# Patient Record
Sex: Male | Born: 1990 | Race: White | Hispanic: No | Marital: Single | Smoking: Former smoker
Health system: Southern US, Community
[De-identification: ages and names within clinical notes are randomized; demographics above are authoritative.]

## PROBLEM LIST (undated history)

## (undated) DIAGNOSIS — F329 Major depressive disorder, single episode, unspecified: Secondary | ICD-10-CM

## (undated) DIAGNOSIS — R569 Unspecified convulsions: Secondary | ICD-10-CM

## (undated) DIAGNOSIS — F419 Anxiety disorder, unspecified: Secondary | ICD-10-CM

## (undated) DIAGNOSIS — F32A Depression, unspecified: Secondary | ICD-10-CM

## (undated) DIAGNOSIS — T50905A Adverse effect of unspecified drugs, medicaments and biological substances, initial encounter: Secondary | ICD-10-CM

## (undated) HISTORY — PX: HAND SURGERY: SHX662

---
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
## 2012-02-15 ENCOUNTER — Encounter: Payer: Self-pay | Admitting: Internal Medicine

## 2012-02-15 ENCOUNTER — Ambulatory Visit (INDEPENDENT_AMBULATORY_CARE_PROVIDER_SITE_OTHER): Payer: BC Managed Care – PPO | Admitting: Internal Medicine

## 2012-02-15 VITALS — BP 122/80 | HR 80 | Temp 97.4°F | Resp 16 | Wt 160.0 lb

## 2012-02-15 DIAGNOSIS — F411 Generalized anxiety disorder: Secondary | ICD-10-CM

## 2012-02-15 DIAGNOSIS — F988 Other specified behavioral and emotional disorders with onset usually occurring in childhood and adolescence: Secondary | ICD-10-CM | POA: Insufficient documentation

## 2012-02-15 DIAGNOSIS — F419 Anxiety disorder, unspecified: Secondary | ICD-10-CM

## 2012-02-15 DIAGNOSIS — Z23 Encounter for immunization: Secondary | ICD-10-CM

## 2012-02-15 DIAGNOSIS — R21 Rash and other nonspecific skin eruption: Secondary | ICD-10-CM

## 2012-02-15 MED ORDER — ESCITALOPRAM OXALATE 10 MG PO TABS: 10.0000 mg | ORAL_TABLET | Freq: Every day | ORAL | Status: DC

## 2012-02-15 MED ORDER — METHYLPHENIDATE HCL 5 MG PO TABS: 5.0000 mg | ORAL_TABLET | Freq: Two times a day (BID) | ORAL | Status: DC

## 2012-02-15 NOTE — Progress Notes (Signed)
Subjective:    Patient ID: Nicholas Harrison, male    DOB: 05-30-1990, 21 y.o.   MRN: [id number]  HPI  F/u anxiety x lifelong. Any sort of public exposure involving speaking would produce stuttering and speech blocks. He would be sweating. Not better in spite of previous treatments. Denies depression. He was not able to tolerate Buspar. Doing well on Lexapro  - no side effects C/o poor focus, gets distracted a lot - worse lately, grades are down - could be better...     Wt Readings from Last 3 Encounters:  02/15/12 160 lb (72.576 kg) (59.22%*)  09/02/11 154 lb (69.854 kg) (52.57%*)  06/02/11 156 lb 12 oz (71.101 kg) (58.34%*)   * Growth percentiles are based on CDC 2-20 Years data.   BP Readings from Last 3 Encounters:  02/15/12 122/80  09/02/11 120/72  06/02/11 118/64      Review of Systems  Constitutional: Negative for appetite change, fatigue and unexpected weight change.  HENT: Negative for nosebleeds, congestion, sore throat, sneezing, trouble swallowing and neck pain.   Eyes: Negative for itching and visual disturbance.  Respiratory: Negative for cough.   Cardiovascular: Negative for chest pain, palpitations and leg swelling.  Gastrointestinal: Negative for nausea, diarrhea, blood in stool and abdominal distention.  Genitourinary: Negative for frequency and hematuria.  Musculoskeletal: Negative for back pain, joint swelling and gait problem.  Skin: Negative for rash.  Neurological: Negative for dizziness, tremors, speech difficulty, weakness and numbness.  Psychiatric/Behavioral: Negative for suicidal ideas, behavioral problems, sleep disturbance, self-injury, dysphoric mood, decreased concentration and agitation. The patient is nervous/anxious.        Objective:   Physical Exam  Constitutional: He is oriented to person, place, and time. He appears well-developed.  HENT:  Mouth/Throat: Oropharynx is clear and moist.  Eyes: Conjunctivae normal are normal. Pupils  are equal, round, and reactive to light.  Neck: Normal range of motion. No JVD present. No thyromegaly present.  Cardiovascular: Normal rate, regular rhythm, normal heart sounds and intact distal pulses.  Exam reveals no gallop and no friction rub.   No murmur heard. Pulmonary/Chest: Effort normal and breath sounds normal. No respiratory distress. He has no wheezes. He has no rales. He exhibits no tenderness.  Abdominal: Soft. Bowel sounds are normal. He exhibits no distension and no mass. There is no tenderness. There is no rebound and no guarding.  Genitourinary: Penis normal.       B testes nl  Musculoskeletal: Normal range of motion. He exhibits no edema and no tenderness.  Lymphadenopathy:    He has no cervical adenopathy.  Neurological: He is alert and oriented to person, place, and time. He has normal reflexes. No cranial nerve deficit. He exhibits normal muscle tone. Coordination normal.  Skin: Skin is warm and dry. No rash noted.  Psychiatric: He has a normal mood and affect. His behavior is normal. Judgment and thought content normal.  2 patches of rash in B underarms  Lab Results  Component Value Date   WBC 5.3 08/30/2011   HGB 15.4 08/30/2011   HCT 45.1 08/30/2011   PLT 236.0 08/30/2011   GLUCOSE 70 08/30/2011   CHOL 162 08/30/2011   TRIG 328.0* 08/30/2011   HDL 51.00 08/30/2011   LDLDIRECT 87.1 08/30/2011   ALT 14 08/30/2011   AST 20 08/30/2011   NA 141 08/30/2011   K 4.2 08/30/2011   CL 103 08/30/2011   CREATININE 0.8 08/30/2011   BUN 11 08/30/2011   CO2 31  08/30/2011   TSH 2.47 08/30/2011          Assessment & Plan:

## 2012-02-15 NOTE — Assessment & Plan Note (Signed)
Not better. Will increase the Lexapro dose

## 2012-02-15 NOTE — Assessment & Plan Note (Signed)
Resolved

## 2012-02-15 NOTE — Assessment & Plan Note (Addendum)
We will try a low dose Ritalin  11/13  Potential benefits of a long term stimulants  use as well as potential risks  and complications were explained to the patient and were aknowledged.

## 2012-05-18 ENCOUNTER — Ambulatory Visit: Payer: BC Managed Care – PPO | Admitting: Internal Medicine

## 2013-02-18 ENCOUNTER — Encounter: Payer: Self-pay | Admitting: Internal Medicine

## 2013-02-18 ENCOUNTER — Ambulatory Visit (INDEPENDENT_AMBULATORY_CARE_PROVIDER_SITE_OTHER): Payer: BC Managed Care – PPO | Admitting: Internal Medicine

## 2013-02-18 VITALS — BP 128/84 | HR 76 | Temp 98.3°F | Resp 16 | Ht 69.0 in | Wt 163.0 lb

## 2013-02-18 DIAGNOSIS — F411 Generalized anxiety disorder: Secondary | ICD-10-CM

## 2013-02-18 DIAGNOSIS — Z23 Encounter for immunization: Secondary | ICD-10-CM

## 2013-02-18 DIAGNOSIS — F419 Anxiety disorder, unspecified: Secondary | ICD-10-CM

## 2013-02-18 DIAGNOSIS — Z0000 Encounter for general adult medical examination without abnormal findings: Secondary | ICD-10-CM

## 2013-02-18 MED ORDER — ESCITALOPRAM OXALATE 20 MG PO TABS: 20.0000 mg | ORAL_TABLET | Freq: Every day | ORAL | Status: DC

## 2013-02-18 NOTE — Assessment & Plan Note (Signed)
We discussed age appropriate health related issues, including available/recomended screening tests and vaccinations. We discussed a need for adhering to healthy diet and exercise. Labs were reviewed/ordered. All questions were answered. Age and sex related issues discussed (safe sex, seat belt use, etc.). 

## 2013-02-18 NOTE — Progress Notes (Signed)
Patient ID: Nicholas Harrison, male   DOB: 12/05/1990, 22 y.o.   MRN: [id number] Patient ID: Nicholas Harrison, male   DOB: [birth year], 22 y.o.   MRN: [id number]  Subjective:    HPI The patient is here for a wellness exam. The patient has been doing well overall without major physical or psychological issues going on lately.  F/u anxiety x lifelong. Now 20% better  Any sort of public exposure involving speaking would produce stuttering and speech blocks. He would be sweating. Not better in spite of previous treatments. Denies depression.   Wt Readings from Last 3 Encounters:  02/18/13 163 lb (73.936 kg)  02/15/12 160 lb (72.576 kg) (59%*, Z = 0.23)  09/02/11 154 lb (69.854 kg) (53%*, Z = 0.06)   * Growth percentiles are based on CDC 2-20 Years data.   BP Readings from Last 3 Encounters:  02/18/13 128/84  02/15/12 122/80  09/02/11 120/72      Review of Systems  Constitutional: Negative for appetite change, fatigue and unexpected weight change.  HENT: Negative for congestion, nosebleeds, sneezing, sore throat and trouble swallowing.   Eyes: Negative for itching and visual disturbance.  Respiratory: Negative for cough.   Cardiovascular: Negative for chest pain, palpitations and leg swelling.  Gastrointestinal: Negative for nausea, diarrhea, blood in stool and abdominal distention.  Genitourinary: Negative for frequency and hematuria.  Musculoskeletal: Negative for back pain, gait problem, joint swelling and neck pain.  Skin: Negative for rash.  Neurological: Negative for dizziness, tremors, speech difficulty, weakness and numbness.  Psychiatric/Behavioral: Negative for suicidal ideas, behavioral problems, sleep disturbance, self-injury, dysphoric mood, decreased concentration and agitation. The patient is nervous/anxious.        Objective:   Physical Exam  Constitutional: He is oriented to person, place, and time. He appears well-developed.  HENT:  Mouth/Throat: Oropharynx is  clear and moist.  Eyes: Conjunctivae are normal. Pupils are equal, round, and reactive to light.  Neck: Normal range of motion. No JVD present. No thyromegaly present.  Cardiovascular: Normal rate, regular rhythm, normal heart sounds and intact distal pulses.  Exam reveals no gallop and no friction rub.   No murmur heard. Pulmonary/Chest: Effort normal and breath sounds normal. No respiratory distress. He has no wheezes. He has no rales. He exhibits no tenderness.  Abdominal: Soft. Bowel sounds are normal. He exhibits no distension and no mass. There is no tenderness. There is no rebound and no guarding.  Genitourinary: Penis normal.  B testes nl  Musculoskeletal: Normal range of motion. He exhibits no edema and no tenderness.  Lymphadenopathy:    He has no cervical adenopathy.  Neurological: He is alert and oriented to person, place, and time. He has normal reflexes. No cranial nerve deficit. He exhibits normal muscle tone. Coordination normal.  Skin: Skin is warm and dry. No rash noted.  Psychiatric: He has a normal mood and affect. His behavior is normal. Judgment and thought content normal.   Lab Results  Component Value Date   WBC 5.3 08/30/2011   HGB 15.4 08/30/2011   HCT 45.1 08/30/2011   PLT 236.0 08/30/2011   GLUCOSE 70 08/30/2011   CHOL 162 08/30/2011   TRIG 328.0* 08/30/2011   HDL 51.00 08/30/2011   LDLDIRECT 87.1 08/30/2011   ALT 14 08/30/2011   AST 20 08/30/2011   NA 141 08/30/2011   K 4.2 08/30/2011   CL 103 08/30/2011   CREATININE 0.8 08/30/2011   BUN 11 08/30/2011   CO2 31 08/30/2011  TSH 2.47 08/30/2011          Assessment & Plan:

## 2013-02-18 NOTE — Progress Notes (Signed)
Pre visit review using our clinic review tool, if applicable. No additional management support is needed unless otherwise documented below in the visit note. 

## 2013-02-28 NOTE — Assessment & Plan Note (Signed)
Better Increase Lexapro if tolerated

## 2013-11-05 ENCOUNTER — Ambulatory Visit (INDEPENDENT_AMBULATORY_CARE_PROVIDER_SITE_OTHER): Payer: BC Managed Care – PPO | Admitting: Internal Medicine

## 2013-11-05 ENCOUNTER — Encounter: Payer: Self-pay | Admitting: Internal Medicine

## 2013-11-05 VITALS — BP 128/84 | HR 64 | Temp 97.8°F | Resp 16 | Wt 158.0 lb

## 2013-11-05 DIAGNOSIS — M5442 Lumbago with sciatica, left side: Secondary | ICD-10-CM

## 2013-11-05 DIAGNOSIS — F419 Anxiety disorder, unspecified: Secondary | ICD-10-CM

## 2013-11-05 DIAGNOSIS — M5430 Sciatica, unspecified side: Secondary | ICD-10-CM

## 2013-11-05 DIAGNOSIS — L853 Xerosis cutis: Secondary | ICD-10-CM

## 2013-11-05 DIAGNOSIS — L258 Unspecified contact dermatitis due to other agents: Secondary | ICD-10-CM

## 2013-11-05 DIAGNOSIS — F411 Generalized anxiety disorder: Secondary | ICD-10-CM

## 2013-11-05 MED ORDER — ESCITALOPRAM OXALATE 20 MG PO TABS: 20.0000 mg | ORAL_TABLET | Freq: Every day | ORAL | Status: DC

## 2013-11-05 NOTE — Progress Notes (Signed)
Pre visit review using our clinic review tool, if applicable. No additional management support is needed unless otherwise documented below in the visit note. 

## 2013-11-05 NOTE — Assessment & Plan Note (Signed)
Continue with current prescription therapy as reflected on the Med list.  

## 2013-11-05 NOTE — Progress Notes (Signed)
   Subjective:    HPI  F/u anxiety x lifelong. Better on Lexapro Public exposure involving speaking used to produce stuttering and speech blocks - much better now.   Wt Readings from Last 3 Encounters:  11/05/13 158 lb (71.668 kg)  02/18/13 163 lb (73.936 kg)  02/15/12 160 lb (72.576 kg) (59%*, Z = 0.23)   * Growth percentiles are based on CDC 2-20 Years data.   BP Readings from Last 3 Encounters:  11/05/13 128/84  02/18/13 128/84  02/15/12 122/80      Review of Systems  Constitutional: Negative for appetite change, fatigue and unexpected weight change.  HENT: Negative for congestion, nosebleeds, sneezing, sore throat and trouble swallowing.   Eyes: Negative for itching and visual disturbance.  Respiratory: Negative for cough.   Cardiovascular: Negative for chest pain, palpitations and leg swelling.  Gastrointestinal: Negative for nausea, diarrhea, blood in stool and abdominal distention.  Genitourinary: Negative for frequency and hematuria.  Musculoskeletal: Negative for back pain, gait problem, joint swelling and neck pain.  Skin: Negative for rash.  Neurological: Negative for dizziness, tremors, speech difficulty, weakness and numbness.  Psychiatric/Behavioral: Negative for suicidal ideas, behavioral problems, sleep disturbance, self-injury, dysphoric mood, decreased concentration and agitation. The patient is nervous/anxious.        Objective:   Physical Exam  Constitutional: He is oriented to person, place, and time. He appears well-developed.  HENT:  Mouth/Throat: Oropharynx is clear and moist.  Eyes: Conjunctivae are normal. Pupils are equal, round, and reactive to light.  Neck: Normal range of motion. No JVD present. No thyromegaly present.  Cardiovascular: Normal rate, regular rhythm, normal heart sounds and intact distal pulses.  Exam reveals no gallop and no friction rub.   No murmur heard. Pulmonary/Chest: Effort normal and breath sounds normal. No  respiratory distress. He has no wheezes. He has no rales. He exhibits no tenderness.  Abdominal: Soft. Bowel sounds are normal. He exhibits no distension and no mass. There is no tenderness. There is no rebound and no guarding.  Genitourinary: Penis normal.  B testes nl  Musculoskeletal: Normal range of motion. He exhibits no edema and no tenderness.  Lymphadenopathy:    He has no cervical adenopathy.  Neurological: He is alert and oriented to person, place, and time. He has normal reflexes. No cranial nerve deficit. He exhibits normal muscle tone. Coordination normal.  Skin: Skin is warm and dry. No rash noted.  Psychiatric: He has a normal mood and affect. His behavior is normal. Judgment and thought content normal.   Lab Results  Component Value Date   WBC 5.3 08/30/2011   HGB 15.4 08/30/2011   HCT 45.1 08/30/2011   PLT 236.0 08/30/2011   GLUCOSE 70 08/30/2011   CHOL 162 08/30/2011   TRIG 328.0* 08/30/2011   HDL 51.00 08/30/2011   LDLDIRECT 87.1 08/30/2011   ALT 14 08/30/2011   AST 20 08/30/2011   NA 141 08/30/2011   K 4.2 08/30/2011   CL 103 08/30/2011   CREATININE 0.8 08/30/2011   BUN 11 08/30/2011   CO2 31 08/30/2011   TSH 2.47 08/30/2011          Assessment & Plan:

## 2013-11-05 NOTE — Assessment & Plan Note (Signed)
8/15 trunk rash Shower less Triamcinolone cream prn

## 2013-11-05 NOTE — Assessment & Plan Note (Signed)
2015 NS Dr Yetta BarreJones   --- L sciatica  Surgery was denied

## 2014-05-21 ENCOUNTER — Other Ambulatory Visit: Payer: Self-pay | Admitting: Internal Medicine

## 2014-06-10 ENCOUNTER — Ambulatory Visit: Payer: Self-pay | Admitting: Internal Medicine

## 2014-10-05 ENCOUNTER — Other Ambulatory Visit: Payer: Self-pay | Admitting: Internal Medicine

## 2014-10-29 ENCOUNTER — Encounter: Payer: Self-pay | Admitting: Internal Medicine

## 2014-10-29 ENCOUNTER — Ambulatory Visit (INDEPENDENT_AMBULATORY_CARE_PROVIDER_SITE_OTHER): Payer: BLUE CROSS/BLUE SHIELD | Admitting: Internal Medicine

## 2014-10-29 VITALS — BP 140/80 | HR 70 | Ht 70.0 in | Wt 170.0 lb

## 2014-10-29 DIAGNOSIS — F1120 Opioid dependence, uncomplicated: Secondary | ICD-10-CM

## 2014-10-29 DIAGNOSIS — F419 Anxiety disorder, unspecified: Secondary | ICD-10-CM | POA: Diagnosis not present

## 2014-10-29 DIAGNOSIS — Z0000 Encounter for general adult medical examination without abnormal findings: Secondary | ICD-10-CM

## 2014-10-29 MED ORDER — TRIAMCINOLONE ACETONIDE 0.1 % EX CREA: 1.0000 "application " | TOPICAL_CREAM | Freq: Four times a day (QID) | CUTANEOUS | Status: DC

## 2014-10-29 MED ORDER — CLONIDINE HCL 0.1 MG PO TABS: 0.1000 mg | ORAL_TABLET | Freq: Two times a day (BID) | ORAL | Status: DC

## 2014-10-29 MED ORDER — CILIDINIUM-CHLORDIAZEPOXIDE 2.5-5 MG PO CAPS: 1.0000 | ORAL_CAPSULE | Freq: Two times a day (BID) | ORAL | Status: DC | PRN

## 2014-10-29 MED ORDER — ESCITALOPRAM OXALATE 20 MG PO TABS: 20.0000 mg | ORAL_TABLET | Freq: Every day | ORAL | Status: DC

## 2014-10-29 NOTE — Assessment & Plan Note (Signed)
We discussed age appropriate health related issues, including available/recomended screening tests and vaccinations. We discussed a need for adhering to healthy diet and exercise. Labs/EKG were reviewed/ordered. All questions were answered.   

## 2014-10-29 NOTE — Assessment & Plan Note (Addendum)
Discussed Psychiatry consult Clonidine prn and Librax prn May need to add Wellbutrin XL

## 2014-10-29 NOTE — Progress Notes (Signed)
Pre visit review using our clinic review tool, if applicable. No additional management support is needed unless otherwise documented below in the visit note. 

## 2014-10-29 NOTE — Progress Notes (Signed)
Subjective:  Patient ID: Nicholas Harrison, male    DOB: 19-Sep-1990  Age: 24 y.o. MRN: [id number]  CC: Annual Exam   HPI Nicholas Harrison presents for a well exam C/o using pain pills  X 8-12 months - Norco or Percocet up to 10 a day. Trying to quit.  He attends NA meetings. C/o stomach cramps and jitterness due to withdrawal...    Outpatient Prescriptions Prior to Visit  Medication Sig Dispense Refill  . PRESCRIPTION MEDICATION Take by mouth. Oral ABX for facial acne.    . escitalopram (LEXAPRO) 20 MG tablet TAKE 1 TABLET BY MOUTH DAILY 90 tablet 0   No facility-administered medications prior to visit.    ROS Review of Systems  Constitutional: Negative for appetite change, fatigue and unexpected weight change.  HENT: Negative for congestion, nosebleeds, sneezing, sore throat and trouble swallowing.   Eyes: Negative for itching and visual disturbance.  Respiratory: Negative for cough.   Cardiovascular: Negative for chest pain, palpitations and leg swelling.  Gastrointestinal: Positive for abdominal pain. Negative for nausea, vomiting, diarrhea, blood in stool and abdominal distention.  Genitourinary: Negative for urgency, frequency and hematuria.  Musculoskeletal: Negative for back pain, joint swelling, gait problem and neck pain.  Skin: Negative for rash.  Neurological: Negative for dizziness, tremors, speech difficulty and weakness.  Psychiatric/Behavioral: Positive for decreased concentration. Negative for suicidal ideas, behavioral problems, sleep disturbance, dysphoric mood and agitation. The patient is nervous/anxious.     Objective:  BP 140/80 mmHg  Pulse 70  Ht  (1.778 m)  Wt 170 lb (77.111 kg)  BMI 24.39 kg/m2  SpO2 98%  BP Readings from Last 3 Encounters:  10/29/14 140/80  11/05/13 128/84  02/18/13 128/84    Wt Readings from Last 3 Encounters:  10/29/14 170 lb (77.111 kg)  11/05/13 158 lb (71.668 kg)  02/18/13 163 lb (73.936 kg)    Physical  Exam  Constitutional: He is oriented to person, place, and time. He appears well-developed. No distress.  NAD  HENT:  Mouth/Throat: Oropharynx is clear and moist.  Eyes: Conjunctivae are normal. Pupils are equal, round, and reactive to light.  Neck: Normal range of motion. No JVD present. No thyromegaly present.  Cardiovascular: Normal rate, regular rhythm, normal heart sounds and intact distal pulses.  Exam reveals no gallop and no friction rub.   No murmur heard. Pulmonary/Chest: Effort normal and breath sounds normal. No respiratory distress. He has no wheezes. He has no rales. He exhibits no tenderness.  Abdominal: Soft. Bowel sounds are normal. He exhibits no distension and no mass. There is no tenderness. There is no rebound and no guarding.  Musculoskeletal: Normal range of motion. He exhibits no edema or tenderness.  Lymphadenopathy:    He has no cervical adenopathy.  Neurological: He is alert and oriented to person, place, and time. He has normal reflexes. No cranial nerve deficit. He exhibits normal muscle tone. He displays a negative Romberg sign. Coordination and gait normal.  Skin: Skin is warm and dry. No rash noted. No erythema.  Psychiatric: He has a normal mood and affect. His behavior is normal. Judgment and thought content normal.    Lab Results  Component Value Date   WBC 5.3 08/30/2011   HGB 15.4 08/30/2011   HCT 45.1 08/30/2011   PLT 236.0 08/30/2011   GLUCOSE 70 08/30/2011   CHOL 162 08/30/2011   TRIG 328.0* 08/30/2011   HDL 51.00 08/30/2011   LDLDIRECT 87.1 08/30/2011   ALT 14 08/30/2011  AST 20 08/30/2011   NA 141 08/30/2011   K 4.2 08/30/2011   CL 103 08/30/2011   CREATININE 0.8 08/30/2011   BUN 11 08/30/2011   CO2 31 08/30/2011   TSH 2.47 08/30/2011    Patient was never admitted.  Assessment & Plan:   Nicholas Harrison was seen today for annual exam.  Diagnoses and all orders for this visit:  Well adult exam  Uncomplicated opioid  dependence  Chronic anxiety -     Ambulatory referral to Psychiatry  Other orders -     cloNIDine (CATAPRES) 0.1 MG tablet; Take 1 tablet (0.1 mg total) by mouth 2 (two) times daily. For withdrawal symptoms. -     clidinium-chlordiazePOXIDE (LIBRAX) 5-2.5 MG per capsule; Take 1 capsule by mouth 2 (two) times daily as needed (abd cramps). -     escitalopram (LEXAPRO) 20 MG tablet; Take 1 tablet (20 mg total) by mouth daily. -     triamcinolone cream (KENALOG) 0.1 %; Apply 1 application topically 4 (four) times daily.  I have changed Mr. Nicholas Harrison's escitalopram. I am also having him start on cloNIDine, clidinium-chlordiazePOXIDE, and triamcinolone cream. Additionally, I am having him maintain his PRESCRIPTION MEDICATION.  Meds ordered this encounter  Medications  . cloNIDine (CATAPRES) 0.1 MG tablet    Sig: Take 1 tablet (0.1 mg total) by mouth 2 (two) times daily. For withdrawal symptoms.    Dispense:  60 tablet    Refill:  2  . clidinium-chlordiazePOXIDE (LIBRAX) 5-2.5 MG per capsule    Sig: Take 1 capsule by mouth 2 (two) times daily as needed (abd cramps).    Dispense:  60 capsule    Refill:  0    Max 60/month  . escitalopram (LEXAPRO) 20 MG tablet    Sig: Take 1 tablet (20 mg total) by mouth daily.    Dispense:  90 tablet    Refill:  1  . triamcinolone cream (KENALOG) 0.1 %    Sig: Apply 1 application topically 4 (four) times daily.    Dispense:  45 g    Refill:  0     Follow-up: Return in about 4 weeks (around 11/26/2014) for a follow-up visit.  Sonda Primes, MD

## 2014-10-29 NOTE — Assessment & Plan Note (Signed)
Cont Lexapro 

## 2014-11-20 ENCOUNTER — Ambulatory Visit: Payer: BLUE CROSS/BLUE SHIELD | Admitting: Internal Medicine

## 2014-12-05 ENCOUNTER — Ambulatory Visit: Payer: BLUE CROSS/BLUE SHIELD | Admitting: Internal Medicine

## 2014-12-16 ENCOUNTER — Telehealth: Payer: Self-pay | Admitting: Internal Medicine

## 2014-12-16 DIAGNOSIS — F1120 Opioid dependence, uncomplicated: Secondary | ICD-10-CM

## 2014-12-16 NOTE — Telephone Encounter (Signed)
Patient is calling to advise that he would like a referral to neurology. He states that he was asked to take methadone by previous provider, and he does not want to take methadone.   Dr. Beryle Beams  Neurologist  Address: [address], [place], [place] [address] Phone: [phone] Hours: Open today  8:30AM-5PM DX of opoid dependence

## 2014-12-17 NOTE — Telephone Encounter (Signed)
Ok Thx 

## 2015-10-06 ENCOUNTER — Telehealth: Payer: Self-pay | Admitting: Internal Medicine

## 2015-10-06 NOTE — Telephone Encounter (Signed)
Patient scheduled an appt for 10/22/2015. States that he is out of escitalopram (LEXAPRO) 20 MG tablet [[id number]][[id number]]  And is asking for a temorary refill. States that [facility] refill, so we do not reflect it.

## 2015-10-06 NOTE — Telephone Encounter (Signed)
OK #15 Thx

## 2015-10-07 MED ORDER — ESCITALOPRAM OXALATE 20 MG PO TABS: 20.0000 mg | ORAL_TABLET | Freq: Every day | ORAL | Status: DC

## 2015-10-07 NOTE — Telephone Encounter (Signed)
Called pt no answer LMOM MD approved # [id number] has been sent to pharmacy...Raechel Chute/lmb

## 2015-10-22 ENCOUNTER — Encounter: Payer: Self-pay | Admitting: Internal Medicine

## 2015-10-22 ENCOUNTER — Ambulatory Visit (INDEPENDENT_AMBULATORY_CARE_PROVIDER_SITE_OTHER): Payer: BLUE CROSS/BLUE SHIELD | Admitting: Internal Medicine

## 2015-10-22 DIAGNOSIS — F988 Other specified behavioral and emotional disorders with onset usually occurring in childhood and adolescence: Secondary | ICD-10-CM

## 2015-10-22 DIAGNOSIS — F909 Attention-deficit hyperactivity disorder, unspecified type: Secondary | ICD-10-CM

## 2015-10-22 DIAGNOSIS — F419 Anxiety disorder, unspecified: Secondary | ICD-10-CM | POA: Diagnosis not present

## 2015-10-22 DIAGNOSIS — F1129 Opioid dependence with unspecified opioid-induced disorder: Secondary | ICD-10-CM | POA: Diagnosis not present

## 2015-10-22 MED ORDER — ESCITALOPRAM OXALATE 20 MG PO TABS: 20.0000 mg | ORAL_TABLET | Freq: Every day | ORAL | 5 refills | Status: DC

## 2015-10-22 MED ORDER — BUPROPION HCL ER (XL) 150 MG PO TB24
150.0000 mg | ORAL_TABLET | Freq: Every day | ORAL | 5 refills | Status: DC
Start: 2015-10-22 — End: 2016-09-14

## 2015-10-22 NOTE — Assessment & Plan Note (Signed)
Options discussed Will try to add Wellbutrin XL for anxiety and stress in am. Cont Lexapro at hs

## 2015-10-22 NOTE — Progress Notes (Signed)
Subjective:  Patient ID: Nicholas Harrison, male    DOB: Jul 20, 1990  Age: 25 y.o. MRN: [id number]  CC: Panic Attack and Depression   HPI DIETRICH SAMUELSON presents for anxiety, panic attacks, depression. C/o OCD traits.  The pt is s/p inpatient treatment - [facility] - March 2017. He used [facility] and Suboxone - he did not like either. There has been some rare opioid use per pt  Outpatient Medications Prior to Visit  Medication Sig Dispense Refill  . escitalopram (LEXAPRO) 20 MG tablet Take 1 tablet (20 mg total) by mouth daily. Must keep Aug appt for future refills 15 tablet 0  . clidinium-chlordiazePOXIDE (LIBRAX) 5-2.5 MG per capsule Take 1 capsule by mouth 2 (two) times daily as needed (abd cramps). (Patient not taking: Reported on 10/22/2015) 60 capsule 0  . cloNIDine (CATAPRES) 0.1 MG tablet Take 1 tablet (0.1 mg total) by mouth 2 (two) times daily. For withdrawal symptoms. (Patient not taking: Reported on 10/22/2015) 60 tablet 2  . PRESCRIPTION MEDICATION Take by mouth. Oral ABX for facial acne.    . triamcinolone cream (KENALOG) 0.1 % Apply 1 application topically 4 (four) times daily. (Patient not taking: Reported on 10/22/2015) 45 g 0   No facility-administered medications prior to visit.     ROS Review of Systems  Constitutional: Negative for appetite change, fatigue and unexpected weight change.  HENT: Negative for congestion, nosebleeds, sneezing, sore throat and trouble swallowing.   Eyes: Negative for itching and visual disturbance.  Respiratory: Negative for cough.   Cardiovascular: Negative for chest pain, palpitations and leg swelling.  Gastrointestinal: Negative for abdominal distention, blood in stool, diarrhea and nausea.  Genitourinary: Negative for frequency and hematuria.  Musculoskeletal: Negative for back pain, gait problem, joint swelling and neck pain.  Skin: Negative for rash.  Neurological: Negative for dizziness, tremors, speech difficulty and  weakness.  Psychiatric/Behavioral: Positive for decreased concentration. Negative for agitation, dysphoric mood, sleep disturbance and suicidal ideas. The patient is nervous/anxious.     Objective:  BP 114/80   Pulse 72   Wt 187 lb (84.8 kg)   SpO2 97%   BMI 26.83 kg/m   BP Readings from Last 3 Encounters:  10/22/15 114/80  10/29/14 140/80  11/05/13 128/84    Wt Readings from Last 3 Encounters:  10/22/15 187 lb (84.8 kg)  10/29/14 170 lb (77.1 kg)  11/05/13 158 lb (71.7 kg)    Physical Exam  Constitutional: He is oriented to person, place, and time. He appears well-developed. No distress.  NAD  HENT:  Mouth/Throat: Oropharynx is clear and moist.  Eyes: Conjunctivae are normal. Pupils are equal, round, and reactive to light.  Neck: Normal range of motion. No JVD present. No thyromegaly present.  Cardiovascular: Normal rate, regular rhythm, normal heart sounds and intact distal pulses.  Exam reveals no gallop and no friction rub.   No murmur heard. Pulmonary/Chest: Effort normal and breath sounds normal. No respiratory distress. He has no wheezes. He has no rales. He exhibits no tenderness.  Abdominal: Soft. Bowel sounds are normal. He exhibits no distension and no mass. There is no tenderness. There is no rebound and no guarding.  Musculoskeletal: Normal range of motion. He exhibits no edema or tenderness.  Lymphadenopathy:    He has no cervical adenopathy.  Neurological: He is alert and oriented to person, place, and time. He has normal reflexes. No cranial nerve deficit. He exhibits normal muscle tone. He displays a negative Romberg sign. Coordination and  gait normal.  Skin: Skin is warm and dry. No rash noted.  Psychiatric: He has a normal mood and affect. His behavior is normal. Judgment and thought content normal.    Lab Results  Component Value Date   WBC 5.3 08/30/2011   HGB 15.4 08/30/2011   HCT 45.1 08/30/2011   PLT 236.0 08/30/2011   GLUCOSE 70 08/30/2011    CHOL 162 08/30/2011   TRIG 328.0 (H) 08/30/2011   HDL 51.00 08/30/2011   LDLDIRECT 87.1 08/30/2011   ALT 14 08/30/2011   AST 20 08/30/2011   NA 141 08/30/2011   K 4.2 08/30/2011   CL 103 08/30/2011   CREATININE 0.8 08/30/2011   BUN 11 08/30/2011   CO2 31 08/30/2011   TSH 2.47 08/30/2011    Patient was never admitted.  Assessment & Plan:   There are no diagnoses linked to this encounter. I am having Mr. Soulier maintain his PRESCRIPTION MEDICATION, cloNIDine, clidinium-chlordiazePOXIDE, triamcinolone cream, and escitalopram.  No orders of the defined types were placed in this encounter.    Follow-up: No Follow-up on file.  Sonda Primes, MD

## 2015-10-22 NOTE — Assessment & Plan Note (Signed)
Options discussed Will try to add

## 2015-10-22 NOTE — Assessment & Plan Note (Addendum)
Summer 2017 rare opioid use per pt Options discussed Will try to add Wellbutrin XL for anxiety and stress in am. Cont Lexapro at hs

## 2015-10-22 NOTE — Progress Notes (Signed)
Pre visit review using our clinic review tool, if applicable. No additional management support is needed unless otherwise documented below in the visit note. 

## 2015-12-14 ENCOUNTER — Telehealth: Payer: Self-pay | Admitting: *Deleted

## 2015-12-14 NOTE — Telephone Encounter (Signed)
Rec'd call pt is requesting refill on Librax...Nicholas Harrison/lmb

## 2015-12-14 NOTE — Telephone Encounter (Signed)
OK to fill this prescription with additional refills x0 Keep ROV Thank you!  

## 2015-12-15 MED ORDER — CILIDINIUM-CHLORDIAZEPOXIDE 2.5-5 MG PO CAPS: 1.0000 | ORAL_CAPSULE | Freq: Two times a day (BID) | ORAL | 0 refills | Status: DC | PRN

## 2015-12-15 NOTE — Telephone Encounter (Signed)
Called pt no answer LMOM rx has been called into [facility]...Nicholas Harrison/lmb

## 2016-01-13 ENCOUNTER — Encounter: Payer: Self-pay | Admitting: Internal Medicine

## 2016-01-13 ENCOUNTER — Ambulatory Visit (INDEPENDENT_AMBULATORY_CARE_PROVIDER_SITE_OTHER): Payer: BLUE CROSS/BLUE SHIELD | Admitting: Internal Medicine

## 2016-01-13 VITALS — BP 130/80 | HR 68 | Wt 189.0 lb

## 2016-01-13 DIAGNOSIS — F419 Anxiety disorder, unspecified: Secondary | ICD-10-CM

## 2016-01-13 DIAGNOSIS — F1129 Opioid dependence with unspecified opioid-induced disorder: Secondary | ICD-10-CM

## 2016-01-13 MED ORDER — QUETIAPINE FUMARATE 25 MG PO TABS: 25.0000 mg | ORAL_TABLET | Freq: Every day | ORAL | 5 refills | Status: DC

## 2016-01-13 NOTE — Assessment & Plan Note (Signed)
d/c Wellbutrin XL for anxiety and stress in am. Cont Lexapro at hs. Start seroquel at a low dose

## 2016-01-13 NOTE — Progress Notes (Signed)
Subjective:  Patient ID: Nicholas Harrison, male    DOB: Jun 17, 1990  Age: 25 y.o. MRN: [id number]  CC: No chief complaint on file.   HPI Nicholas Harrison presents for anxiety and narcotic dependence; sober 1.5 months. C/o feeling uptight He stopped Wellbutrin - it did not help to focus. C/o anxiety in the mornings  Outpatient Medications Prior to Visit  Medication Sig Dispense Refill  . escitalopram (LEXAPRO) 20 MG tablet Take 1 tablet (20 mg total) by mouth daily. Take in PM 30 tablet 5  . PRESCRIPTION MEDICATION Take by mouth. Oral ABX for facial acne.    . triamcinolone cream (KENALOG) 0.1 % Apply 1 application topically 4 (four) times daily. 45 g 0  . buPROPion (WELLBUTRIN XL) 150 MG 24 hr tablet Take 1 tablet (150 mg total) by mouth daily. Take in am (Patient not taking: Reported on 01/13/2016) 30 tablet 5  . clidinium-chlordiazePOXIDE (LIBRAX) 5-2.5 MG capsule Take 1 capsule by mouth 2 (two) times daily as needed (abd cramps). (Patient not taking: Reported on 01/13/2016) 60 capsule 0  . cloNIDine (CATAPRES) 0.1 MG tablet Take 1 tablet (0.1 mg total) by mouth 2 (two) times daily. For withdrawal symptoms. (Patient not taking: Reported on 01/13/2016) 60 tablet 2   No facility-administered medications prior to visit.     ROS Review of Systems  Constitutional: Negative for appetite change, fatigue and unexpected weight change.  HENT: Negative for congestion, nosebleeds, sneezing, sore throat and trouble swallowing.   Eyes: Negative for itching and visual disturbance.  Respiratory: Negative for cough.   Cardiovascular: Negative for chest pain, palpitations and leg swelling.  Gastrointestinal: Negative for abdominal distention, blood in stool, diarrhea and nausea.  Genitourinary: Negative for frequency and hematuria.  Musculoskeletal: Negative for back pain, gait problem, joint swelling and neck pain.  Skin: Negative for rash.  Neurological: Negative for dizziness, tremors, speech  difficulty and weakness.  Psychiatric/Behavioral: Positive for decreased concentration, dysphoric mood and sleep disturbance. Negative for agitation, confusion and suicidal ideas. The patient is nervous/anxious.     Objective:  BP 130/80   Pulse 68   Wt 189 lb (85.7 kg)   SpO2 98%   BMI 27.12 kg/m   BP Readings from Last 3 Encounters:  01/13/16 130/80  10/22/15 114/80  10/29/14 140/80    Wt Readings from Last 3 Encounters:  01/13/16 189 lb (85.7 kg)  10/22/15 187 lb (84.8 kg)  10/29/14 170 lb (77.1 kg)    Physical Exam  Constitutional: He is oriented to person, place, and time. He appears well-developed. No distress.  NAD  HENT:  Mouth/Throat: Oropharynx is clear and moist.  Eyes: Conjunctivae are normal. Pupils are equal, round, and reactive to light.  Neck: Normal range of motion. No JVD present. No thyromegaly present.  Cardiovascular: Normal rate, regular rhythm, normal heart sounds and intact distal pulses.  Exam reveals no gallop and no friction rub.   No murmur heard. Pulmonary/Chest: Effort normal and breath sounds normal. No respiratory distress. He has no wheezes. He has no rales. He exhibits no tenderness.  Abdominal: Soft. Bowel sounds are normal. He exhibits no distension and no mass. There is no tenderness. There is no rebound and no guarding.  Musculoskeletal: Normal range of motion. He exhibits no edema or tenderness.  Lymphadenopathy:    He has no cervical adenopathy.  Neurological: He is alert and oriented to person, place, and time. He has normal reflexes. No cranial nerve deficit. He exhibits normal muscle tone. He  displays a negative Romberg sign. Coordination and gait normal.  Skin: Skin is warm and dry. No rash noted.  Psychiatric: His behavior is normal. Judgment and thought content normal.    Lab Results  Component Value Date   WBC 5.3 08/30/2011   HGB 15.4 08/30/2011   HCT 45.1 08/30/2011   PLT 236.0 08/30/2011   GLUCOSE 70 08/30/2011   CHOL  162 08/30/2011   TRIG 328.0 (H) 08/30/2011   HDL 51.00 08/30/2011   LDLDIRECT 87.1 08/30/2011   ALT 14 08/30/2011   AST 20 08/30/2011   NA 141 08/30/2011   K 4.2 08/30/2011   CL 103 08/30/2011   CREATININE 0.8 08/30/2011   BUN 11 08/30/2011   CO2 31 08/30/2011   TSH 2.47 08/30/2011    Patient was never admitted.  Assessment & Plan:   There are no diagnoses linked to this encounter. I am having Mr. Tubby maintain his PRESCRIPTION MEDICATION, cloNIDine, triamcinolone cream, buPROPion, escitalopram, and clidinium-chlordiazePOXIDE.  No orders of the defined types were placed in this encounter.    Follow-up: No Follow-up on file.  Sonda Primes, MD

## 2016-01-13 NOTE — Assessment & Plan Note (Addendum)
sober 1.5 months. C/o feeling uptight. He stopped Wellbutrin - it did not help to focus Seeing a psychologist  d/c Wellbutrin XL for anxiety and stress in am - it did not help. Cont Lexapro at hs. Start seroquel at a low dose

## 2016-01-13 NOTE — Progress Notes (Signed)
Pre visit review using our clinic review tool, if applicable. No additional management support is needed unless otherwise documented below in the visit note. 

## 2016-02-17 ENCOUNTER — Telehealth (HOSPITAL_COMMUNITY): Payer: Self-pay | Admitting: *Deleted

## 2016-02-17 NOTE — Telephone Encounter (Signed)
Office received [facility] sch new pt appt for pt. Called ref office and [facility]. Informed her that provider would like more [facility]. Mary verbalized understanding and stated she will contact the patient and let him know he need to get his information from [facility] and have the pt sign a release. Office verbalized understanding.

## 2016-05-30 ENCOUNTER — Encounter (HOSPITAL_COMMUNITY): Payer: Self-pay | Admitting: *Deleted

## 2016-05-30 ENCOUNTER — Emergency Department (HOSPITAL_COMMUNITY)
Admission: EM | Admit: 2016-05-30 | Discharge: 2016-05-31 | Disposition: A | Discharge status: Home / Self Care | Payer: BLUE CROSS/BLUE SHIELD | Attending: Emergency Medicine | Admitting: Emergency Medicine

## 2016-05-30 DIAGNOSIS — F1994 Other psychoactive substance use, unspecified with psychoactive substance-induced mood disorder: Secondary | ICD-10-CM | POA: Diagnosis present

## 2016-05-30 DIAGNOSIS — F1120 Opioid dependence, uncomplicated: Secondary | ICD-10-CM | POA: Diagnosis present

## 2016-05-30 DIAGNOSIS — Z818 Family history of other mental and behavioral disorders: Secondary | ICD-10-CM | POA: Diagnosis not present

## 2016-05-30 DIAGNOSIS — F1124 Opioid dependence with opioid-induced mood disorder: Secondary | ICD-10-CM | POA: Insufficient documentation

## 2016-05-30 DIAGNOSIS — Z87891 Personal history of nicotine dependence: Secondary | ICD-10-CM | POA: Diagnosis not present

## 2016-05-30 DIAGNOSIS — Z79899 Other long term (current) drug therapy: Secondary | ICD-10-CM | POA: Insufficient documentation

## 2016-05-30 DIAGNOSIS — F1220 Cannabis dependence, uncomplicated: Secondary | ICD-10-CM | POA: Diagnosis not present

## 2016-05-30 DIAGNOSIS — F909 Attention-deficit hyperactivity disorder, unspecified type: Secondary | ICD-10-CM | POA: Diagnosis not present

## 2016-05-30 LAB — CBC
HCT: 44.3 % (ref 39.0–52.0)
Hemoglobin: 15.1 g/dL (ref 13.0–17.0)
MCH: 28.5 pg (ref 26.0–34.0)
MCHC: 34.1 g/dL (ref 30.0–36.0)
MCV: 83.6 fL (ref 78.0–100.0)
PLATELETS: 283 10*3/uL (ref 150–400)
RBC: 5.3 MIL/uL (ref 4.22–5.81)
RDW: 13.5 % (ref 11.5–15.5)
WBC: 9.8 10*3/uL (ref 4.0–10.5)

## 2016-05-30 LAB — COMPREHENSIVE METABOLIC PANEL
ALBUMIN: 5.2 g/dL — AB (ref 3.5–5.0)
ALT: 13 U/L — ABNORMAL LOW (ref 17–63)
AST: 18 U/L (ref 15–41)
Alkaline Phosphatase: 68 U/L (ref 38–126)
Anion gap: 13 (ref 5–15)
BUN: 11 mg/dL (ref 6–20)
CHLORIDE: 104 mmol/L (ref 101–111)
CO2: 24 mmol/L (ref 22–32)
CREATININE: 0.78 mg/dL (ref 0.61–1.24)
Calcium: 10.1 mg/dL (ref 8.9–10.3)
GFR calc non Af Amer: >60 mL/min (ref >60)
Glucose, Bld: 98 mg/dL (ref 65–99)
Potassium: 3.2 mmol/L — ABNORMAL LOW (ref 3.5–5.1)
SODIUM: 141 mmol/L (ref 135–145)
Total Bilirubin: 0.7 mg/dL (ref 0.3–1.2)
Total Protein: 8.2 g/dL — ABNORMAL HIGH (ref 6.5–8.1)

## 2016-05-30 LAB — ETHANOL: Alcohol, Ethyl (B): <5 mg/dL (ref <5)

## 2016-05-30 LAB — RAPID URINE DRUG SCREEN, HOSP PERFORMED
AMPHETAMINES: NOT DETECTED
Barbiturates: NOT DETECTED
Benzodiazepines: POSITIVE — AB
Cocaine: NOT DETECTED
OPIATES: POSITIVE — AB
Tetrahydrocannabinol: POSITIVE — AB

## 2016-05-30 MED ORDER — ONDANSETRON HCL 4 MG PO TABS
4.0000 mg | ORAL_TABLET | Freq: Three times a day (TID) | ORAL | Status: DC | PRN
  Administered 2016-05-31: 4 mg via ORAL
  Filled 2016-05-30: qty 1

## 2016-05-30 MED ORDER — IBUPROFEN 400 MG PO TABS: 600.0000 mg | ORAL_TABLET | Freq: Three times a day (TID) | ORAL | Status: DC | PRN

## 2016-05-30 NOTE — BH Assessment (Signed)
Tele Assessment Note   Nicholas Harrison is an 26 y.o. male presenting to the ER requesting help with opiate use. The patient states he has been using opiates for four years. Using daily for 2 yrs from 50 mg to 300 mg per day. First used at the age of 26 yrs old. One inpatient detox 1 yr ago at [facility]. The patient remained clean for 50 days.  The patient uses xanax two or three months at a time. Using three or four bars at a time. First use at age 30 yrs old. Reports high anxiety on a regular basis. Using cannabis 2 or 3 times a week, bong or blunts, 2 grams a week.  The patient reports having suicidal thoughts with a vague plan to kill himself and homicidal thoughts toward other random people. He reports having these thoughts any time he's getting off drugs. He denied intent saying the consequences of his actions would keep him from acting on thoughts. He has knives and brass knuckles at home.  He is currently having these thoughts. 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 He stole from [place] recently and has a court date. Other stressors involved his parents pending divorce. He lives with his parents currently. Reports depression and anxiety on maternal side and father had issues with alcohol in the past. The patient appeared depressed, had flat affect, fair eye contact, was cooperative, pleasant, oriented x4.   The patient is willing to sign himself into a facility. Provider on call Donell Sievert NP recommends inpatient treatment    Diagnosis: Opiate use disorder, severe; Cannabis use disorder; Sedative, Hypnotic or Anxiolytic use disorder.  Past Medical History: History reviewed. No pertinent past medical history.  History reviewed. No pertinent surgical history.  Family History:  Family History  Problem Relation Age of Onset  . Depression Mother     anxiety  . Diabetes Maternal Grandfather      Social History:  reports that he has quit smoking. He has quit using smokeless tobacco. His smokeless tobacco use included Chew. He reports that he uses drugs. He reports that he does not drink alcohol.  Additional Social History:  Alcohol / Drug Use Pain Medications: see MAR Prescriptions: see MAR Over the Counter: see MAR  CIWA: CIWA-Ar BP: 147/85 Pulse Rate: 74 COWS:    PATIENT STRENGTHS: (choose at least two) Average or above average intelligence Motivation for treatment/growth  Allergies:  Allergies  Allergen Reactions  . Buspar [Buspirone] Nausea Only    Dizziness (also)    Home Medications:  (Not in a hospital admission)  OB/GYN Status:  No LMP for male patient.  General Assessment Data Location of Assessment: [facility] ED TTS Assessment: In system Is this a Tele or Face-to-Face Assessment?: Tele Assessment Is this an Initial Assessment or a Re-assessment for this encounter?: Initial Assessment Marital status: Single Is patient pregnant?: No Pregnancy Status: No Living Arrangements: Parent Can pt return to current living arrangement?: Yes Admission Status: Voluntary Is patient capable of signing voluntary admission?: Yes Referral Source: Self/Family/Friend Insurance type:  Herbalist)  Medical Screening Exam [facility] Walk-in ONLY) Medical Exam completed: Yes  Crisis Care Plan Living Arrangements: Parent Name of Psychiatrist: n/a Name of Therapist: n/a  Education Status Is patient currently in school?: No Highest grade of school patient has completed:  (2 yrs of college)  Risk to self with the past 6 months  Suicidal Ideation: Yes-Currently Present Has patient been a risk to self within the past 6 months prior to admission? : Yes Suicidal Intent: No Has patient had any suicidal intent within the past 6 months prior to admission? : Yes Is patient at risk for suicide?: Yes Suicidal Plan?: Yes-Currently Present Has patient had any suicidal plan within the past 6  months prior to admission? : Yes Specify Current Suicidal Plan:  (vague plan to hurt self) Access to Means: Yes Specify Access to Suicidal Means:  (has knives and brass nuckles) What has been your use of drugs/alcohol within the last 12 months?:  (opiates, cannabis, benzo's) Previous Attempts/Gestures: No How many times?: 0 Other Self Harm Risks: 0 Intentional Self Injurious Behavior: None Family Suicide History: Unknown Recent stressful life event(s): Divorce Persecutory voices/beliefs?: No Depression: Yes Depression Symptoms: Loss of interest in usual pleasures, Feeling angry/irritable Substance abuse history and/or treatment for substance abuse?: Yes Suicide prevention information given to non-admitted patients: Not applicable  Risk to Others within the past 6 months Homicidal Ideation: Yes-Currently Present Does patient have any lifetime risk of violence toward others beyond the six months prior to admission? : No Thoughts of Harm to Others: Yes-Currently Present Comment - Thoughts of Harm to Others:  (vague thoughts to harm others) Current Homicidal Intent: No Current Homicidal Plan: Yes-Currently Present Describe Current Homicidal Plan:  (vague plan to use knives) Access to Homicidal Means: Yes Describe Access to Homicidal Means:  (knives) Identified Victim:  (random people) History of harm to others?: No Assessment of Violence: None Noted Violent Behavior Description:  (n/a) Does patient have access to weapons?:  (yes) Criminal Charges Pending?: No Does patient have a court date: Yes Court Date: 06/08/16 Is patient on probation?: No  Psychosis Hallucinations: None noted Delusions: None noted  Mental Status Report Appearance/Hygiene: In scrubs Eye Contact: Good Motor Activity: Unremarkable Speech: Logical/coherent Level of Consciousness: Alert Mood: Depressed, Anxious Affect: Flat Anxiety Level: Moderate Thought Processes: Coherent, Relevant Judgement:  Impaired Orientation: Person, Place, Time, Situation Obsessive Compulsive Thoughts/Behaviors: None  Cognitive Functioning Concentration: Decreased Memory: Recent Intact, Remote Intact IQ: Average Insight: Fair Impulse Control: Poor Appetite: Poor Sleep: Increased  ADLScreening [facility] Assessment Services) Patient's cognitive ability adequate to safely complete daily activities?: Yes Patient able to express need for assistance with ADLs?: Yes Independently performs ADLs?: Yes (appropriate for developmental age)  Prior Inpatient Therapy Prior Inpatient Therapy: Yes Prior Therapy Dates:  (1 yr ago) Prior Therapy Facilty/Provider(s):  Systems analyst([facility]) Reason for Treatment:  (drug treatment)  Prior Outpatient Therapy Prior Outpatient Therapy: No Does patient have an ACCT team?: No Does patient have Intensive In-House Services?  : No Does patient have [facility]arch services? : No Does patient have P4CC services?: No  ADL Screening (condition at time of admission) Patient's cognitive ability adequate to safely complete daily activities?: Yes Patient able to express need for assistance with ADLs?: Yes Independently performs ADLs?: Yes (appropriate for developmental age)             Merchant navy officerAdvance Directives (For Healthcare) Does Patient Have a Medical Advance Directive?: No    Additional Information 1:1 In Past 12 Months?: No CIRT Risk: No Elopement Risk: No Does patient have medical clearance?: Yes     Disposition:  Disposition Initial Assessment Completed for this Encounter: Yes Disposition of Patient: Inpatient treatment program Type of inpatient treatment program: Adult  Westley Hummershley H Kline Bulthuis 05/30/2016 8:04 PM

## 2016-05-30 NOTE — ED Provider Notes (Signed)
[facility]-EMER[facility]ENCY DEPT Provider Note   CSN: [id number] Arrival date & time: 05/30/16  1559   History   Chief Complaint Chief Complaint  Patient presents with  . Psychiatric Evaluation    HPI Nicholas Harrison is a 26 y.o. male who presents with opioid dependence and passive SI. No significant PMH. His parents are at bedside and provide most of history. He has been using opiates since 2011. He has SI when he does not use and has tried to detox on his own without success. No self injurious behaviors or prior suicide attempts. He [facility] but started to use again once he got out. He has also tried outpatient medicines with no success. He is on Lexapro for depression which has not helped. He has also tried Seroquel, Wellbutrin. No pain/physical complaints other than irritability.   HPI  History reviewed. No pertinent past medical history.  Patient Active Problem List   Diagnosis Date Noted  . Opioid dependence (HCC) 10/29/2014  . ADD (attention deficit disorder) 02/15/2012  . Well adult exam 09/02/2011  . Rash 09/02/2011  . Chronic anxiety 06/05/2011    History reviewed. No pertinent surgical history.   Home Medications    Prior to Admission medications   Medication Sig Start Date End Date Taking? Authorizing Provider  escitalopram (LEXAPRO) 20 MG tablet Take 1 tablet (20 mg total) by mouth daily. Take in PM Patient taking differently: Take 20 mg by mouth daily.  10/22/15  Yes Aleksei Plotnikov V, MD  buPROPion (WELLBUTRIN XL) 150 MG 24 hr tablet Take 1 tablet (150 mg total) by mouth daily. Take in am Patient not taking: Reported on 05/30/2016 10/22/15   Macarthur Critchley Plotnikov V, MD  QUEtiapine (SEROQUEL) 25 MG tablet Take 1-2 tablets (25-50 mg total) by mouth at bedtime. 1-2 at 8-9 pm Patient not taking: Reported on 05/30/2016 01/13/16   Jacinta Shoe V, MD  triamcinolone cream (KENALOG) 0.1 % Apply 1 application topically 4 (four)  times daily. Patient not taking: Reported on 05/30/2016 10/29/14   Tresa Garter, MD    Family History Family History  Problem Relation Age of Onset  . Depression Mother     anxiety  . Diabetes Maternal Grandfather     Social History Social History  Substance Use Topics  . Smoking status: Former Games developer  . Smokeless tobacco: Former Neurosurgeon    Types: Chew     Comment: only experimented  . Alcohol use No     Allergies   Buspar [buspirone]   Review of Systems Review of Systems  Respiratory: Negative for shortness of breath.   Cardiovascular: Negative for chest pain.  Gastrointestinal: Negative for abdominal pain.  Neurological: Negative for headaches.  Psychiatric/Behavioral: Positive for dysphoric mood and suicidal ideas (passive). Negative for hallucinations and self-injury. The patient is nervous/anxious.   All other systems reviewed and are negative.    Physical Exam Updated Vital Signs BP 155/82   Pulse 80   Temp 98.8 F (37.1 C)   Resp 16   Ht 5\' 9"  (1.753 m)   Wt 82.7 kg   SpO2 98%   BMI 26.93 kg/m   Physical Exam  Constitutional: He is oriented to person, place, and time. He appears well-developed and well-nourished. No distress.  HENT:  Head: Normocephalic and atraumatic.  Eyes: Conjunctivae are normal. Pupils are equal, round, and reactive to light. Right eye exhibits no discharge. Left eye exhibits no discharge. No scleral icterus.  Neck: Normal range  of motion.  Cardiovascular: Normal rate.   Pulmonary/Chest: Effort normal. No respiratory distress.  Abdominal: He exhibits no distension.  Neurological: He is alert and oriented to person, place, and time.  Skin: Skin is warm and dry.  Psychiatric: His speech is normal. Judgment normal. His mood appears anxious. He is withdrawn. He is not actively hallucinating. Thought content is not paranoid and not delusional. Cognition and memory are normal. He exhibits a depressed mood. He expresses no  homicidal and no suicidal ideation. He expresses no suicidal plans and no homicidal plans. He is attentive.  Nursing note and vitals reviewed.    ED Treatments / Results  Labs (all labs ordered are listed, but only abnormal results are displayed) Labs Reviewed  COMPREHENSIVE METABOLIC PANEL - Abnormal; Notable for the following:       Result Value   Potassium 3.2 (*)    Total Protein 8.2 (*)    Albumin 5.2 (*)    ALT 13 (*)    All other components within normal limits  RAPID URINE DRUG SCREEN, HOSP PERFORMED - Abnormal; Notable for the following:    Opiates POSITIVE (*)    Benzodiazepines POSITIVE (*)    Tetrahydrocannabinol POSITIVE (*)    All other components within normal limits  ETHANOL  CBC    EKG  EKG Interpretation None       Radiology No results found.  Procedures Procedures (including critical care time)  Medications Ordered in ED Medications - No data to display   Initial Impression / Assessment and Plan / ED Course  I have reviewed the triage vital signs and the nursing notes.  Pertinent labs & imaging results that were available during my care of the patient were reviewed by me and considered in my medical decision making (see chart for details).  26 year old male presents with substance abuse induced depression and passive SI. No plan or hx of self-injury. He is mildly hypertensive but otherwise vitals are normal. Had lengthy discussion with family regarding treatment options. I provided them with resources and discussed inpatient and outpatient treatment programs with them however they would like evaluation by psychiatrist here. I did talk with them about him not meeting inpatient criteria but they would like second opinion. Pt is medically cleared. TTS consulted.  Final Clinical Impressions(s) / ED Diagnoses   Final diagnoses:  Opioid dependence with opioid-induced mood disorder [facility](HCC)    New Prescriptions New Prescriptions   No medications on  file     Bethel BornKelly Marie Deloise Marchant, PA-C 05/30/16 2336    Shaune Pollackameron Isaacs, MD 06/01/16 1146

## 2016-05-30 NOTE — ED Triage Notes (Signed)
He wants to be detoxed from opiates.. He too 20 mg of oxycodone 2 hours ago  No alcohol

## 2016-05-30 NOTE — ED Notes (Signed)
Pt just verbalized to this RN that it was ok for his parents to be informed on his care.   MomBoyd Kerbs- Penny  [phone] DadOnalee Hua-  Jameir   [phone]

## 2016-05-31 DIAGNOSIS — Z818 Family history of other mental and behavioral disorders: Secondary | ICD-10-CM | POA: Diagnosis not present

## 2016-05-31 DIAGNOSIS — F1220 Cannabis dependence, uncomplicated: Secondary | ICD-10-CM

## 2016-05-31 DIAGNOSIS — Z79899 Other long term (current) drug therapy: Secondary | ICD-10-CM

## 2016-05-31 DIAGNOSIS — F1124 Opioid dependence with opioid-induced mood disorder: Secondary | ICD-10-CM | POA: Diagnosis not present

## 2016-05-31 DIAGNOSIS — F1994 Other psychoactive substance use, unspecified with psychoactive substance-induced mood disorder: Secondary | ICD-10-CM | POA: Diagnosis not present

## 2016-05-31 DIAGNOSIS — Z87891 Personal history of nicotine dependence: Secondary | ICD-10-CM | POA: Diagnosis not present

## 2016-05-31 MED ORDER — LOPERAMIDE HCL 2 MG PO CAPS: 2.0000 mg | ORAL_CAPSULE | ORAL | Status: DC | PRN

## 2016-05-31 MED ORDER — DICYCLOMINE HCL 20 MG PO TABS
20.0000 mg | ORAL_TABLET | Freq: Four times a day (QID) | ORAL | Status: DC | PRN
  Administered 2016-05-31: 20 mg via ORAL
  Filled 2016-05-31: qty 1

## 2016-05-31 MED ORDER — CLONIDINE HCL 0.2 MG PO TABS
0.1000 mg | ORAL_TABLET | Freq: Four times a day (QID) | ORAL | Status: DC
  Administered 2016-05-31: 0.1 mg via ORAL
  Filled 2016-05-31: qty 1

## 2016-05-31 MED ORDER — CLONIDINE HCL 0.2 MG PO TABS: 0.1000 mg | ORAL_TABLET | Freq: Every day | ORAL | Status: DC

## 2016-05-31 MED ORDER — CLONIDINE HCL 0.2 MG PO TABS: 0.1000 mg | ORAL_TABLET | ORAL | Status: DC

## 2016-05-31 MED ORDER — NAPROXEN 250 MG PO TABS: 500.0000 mg | ORAL_TABLET | Freq: Two times a day (BID) | ORAL | Status: DC | PRN

## 2016-05-31 MED ORDER — METHOCARBAMOL 500 MG PO TABS: 500.0000 mg | ORAL_TABLET | Freq: Three times a day (TID) | ORAL | Status: DC | PRN

## 2016-05-31 MED ORDER — HYDROXYZINE HCL 25 MG PO TABS: 25.0000 mg | ORAL_TABLET | Freq: Four times a day (QID) | ORAL | Status: DC | PRN

## 2016-05-31 NOTE — ED Notes (Signed)
Snack given.

## 2016-05-31 NOTE — ED Notes (Signed)
Pt on phone at nurses' desk attempting to call his mother. 

## 2016-05-31 NOTE — ED Notes (Signed)
Pt [facility] Voiced understanding to follow up.

## 2016-05-31 NOTE — ED Notes (Signed)
Pt aware of tx plan - d/c to home w/outpt resources. Pt also aware Morrie Sheldonshley, SW, spoke w/his mother and his mother is now [facility] discuss.

## 2016-05-31 NOTE — Discharge Instructions (Signed)
[facility] Intensive Outpatient [facility]- Call to schedule assessment- [phone]   Substance Abuse Treatment Programs  Intensive Outpatient Programs [facility]     [address]      [place], [place]                   [phone]       [facility] [address] [place], [place] [phone]  [facility]     (Inpatient and outpatient)     [address]           [phone]    [facility] [phone] (Suboxone and Methadone)  [address]      [place], [place] [address]      [phone]       [address] [place], [place] [phone]  [facility] (Outpatient/Inpatient, Chemical)    (insurance only) [phone]             [facility] (Groups & Residential) [place], [place] [phone]     [facility]     [address]     [place], [place]      [phone]       [facility] (for caregivers and family) [phone] [place], [place] [phone]      Residential Treatment Programs [facility]      [address], [place], [place] [address]  [phone]       [facility] [address] [place], [place] [address] [phone]  [facility] [place]        [phone]       [facility] [phone]  [facility] ([facility]             [address]                                         [place], [place]                                                [phone] or [phone]                               [facility] [address] [place], [address] [phone]  [facility]    [address]      [place], [place]     [phone]       [facility] [address] [place], [place] [phone]  [facility]   [address] [place], [place]  [address]     [phone]      Admissions: 8am-3pm M-F  [facility] ([facility]) [address] [place], [place] [phone]  [facility]: Residential Program [phone] Days)   [place], [place]      [phone] or [phone]     [facility]: [facility] [place], [place] (Walk in Hours over the weekend or by referral)  [facility] [address] [place], [place], [place] [address] [phone]  Crisis Mobile: [facility]:  [phone] (for crisis response 24 hours a day) [facility] Hotline:  351-444-0329 Outpatient Psychiatry and Counseling  [facility]: Mobile Crisis Management 24 hours:  [phone]  [facility] of the [employer] sliding scale fee and walk in schedule: M-F 8am-12pm/1pm-3pm [address]  [place], [place] [address] [phone]  [facility] [address] [place], [place] [address] [phone]  [facility] ([facility] [employer])- new patient walk-in appointments available Monday - Friday 8am -3pm.          [address] [place], [place] [address] [phone] or crisis line- [phone]  [facility] Services/ Intensive Outpatient Therapy Program [address] [place], [place] [address] [phone]  [facility]                  Crisis Services      [phone] [address]     [place], [place] [address]                 [facility]   [facility] [phone]. [address] [place], [place] [address]   [facility] of Care          [address]  [place], [place] [address]       [phone]  [facility] [address] [place], [place] [address] [phone]  [facility]    [address]    [place], [place] [address]     [phone]       Andee Poles,  MD     [address]     [place] [address]     [phone]       [facility] [address] [place] [address]  [facility]     [address] [place], [place]      [phone]       [facility] Eulogio Ditch, MD [address] [place], [place] [address] [phone]  [facility]     [address]     [place], [place] [address]     [phone]       [facility] [address] [place], [place] [address] [phone] Resources for Temporary Residential Assistance/Crisis Centers  DAY CENTERS [facility]) M-F 8am-3pm   [address] [place], [place] [address]   [phone] Services include: laundry, barbering, support groups, case management, phone  & computer access, showers, AA/NA mtgs, mental health/substance abuse nurse, job skills class, disability information, VA assistance, spiritual classes, etc.   HOMELESS SHELTERS  [facility]     [facility]   [address] [place]     [phone]              [employer] (women and children)       [address] [place], [place] [address] [phone] [email or web address] for application and process Application Required  [facility]   [address]    [place] [address]     [phone]                    [facility] [address] [place], [place] [address] [phone] application appt.) Application Required  [employer] (women only)    [address]     [place], [place]  [id number]     [phone]      Intake starts 6pm daily Need valid ID, SSC, & Police report [facility] [place] [address] [place], [place] [phone] Application Required  [facility] (men only)     [address]      [place],  [place]     [phone]       [facility] [place] (Pregnant women only) [address] [place], [place] [phone]  [facility]      [address]      [place], [place] [address]     [phone]             [facility] [address] [place], [place] [phone] 90 day commitment/SA/Application process  [facility](men only)     [address]     [place], [place]     [phone]       Check-in at [facility]pm            C[facility] [address] [place], [place] [id number] [phone] Men/Women/Women and Children must be there by 7 pm  [facility] [place], [place] [phone]

## 2016-05-31 NOTE — ED Notes (Signed)
Pt resting, denies any complaint at this time.  Ambulated to the BR with steady gait.  Pt is calm and cooperative at this time.

## 2016-05-31 NOTE — Consult Note (Signed)
[facility] [place] Psychiatry Consult   Reason for Consult:  Transient statements of suicidal/homicidal ideation when high on drugs Referring Physician:  EDP Patient Identification: Nicholas Harrison MRN:  [id number] Principal Diagnosis: Substance induced mood disorder [facility]) Diagnosis:   Patient Active Problem List   Diagnosis Date Noted  . Substance induced mood disorder ([place]) [F19.94] 05/31/2016    Priority: High  . Moderate tetrahydrocannabinol (THC) dependence ([place]) [F12.20] 05/31/2016    Priority: High  . Opioid dependence ([place]) [F11.20] 10/29/2014    Priority: High  . Opioid dependence with opioid-induced mood disorder ([place]) [F11.24]   . ADD (attention deficit disorder) [F98.8] 02/15/2012  . Well adult exam [Z00.00] 09/02/2011  . Rash [R21] 09/02/2011  . Chronic anxiety [F41.9] 06/05/2011    Total Time spent with patient: 30 minutes  Subjective:   Nicholas Harrison is a 26 y.o. male patient admitted with reports of intoxication with opiates, benzos, THC substances.  Pt seen and chart reviewed. Pt is alert/oriented x4, calm, cooperative, and appropriate to situation. Pt denies suicidal/homicidal ideation and psychosis and does not appear to be responding to internal stimuli. Pt reports that he was intoxicated on numerous substances listed above at time of arrival and that he always becomes very upset when mixing these substances due to the "high" in addition to feeling guilty for abusing the substances and disappointing his family. Pt denies any desire to harm himself or others and reports that he wants outpatient resources or longterm inpatient rehab if he is able to secure such a place. Pt is lucid, very pleasant, and has good insight. He states that he received great benefit from being inpatient at [facility] and his hopeful that he may return there at some point in time.    HPI:  I have reviewed and concur with HPI elements below, modified as follows: "Nicholas Harrison is an 26  y.o. male presenting to the ER requesting help with opiate use. The patient states he has been using opiates for four years. Using daily for 2 yrs from 50 mg to 300 mg per day. First used at the age of 26 yrs old. One inpatient detox 1 yr ago at [facility]. The patient remained clean for 50 days. The patient uses xanax two or three months at a time. Using three or four bars at a time. First use at age 67 yrs old. Reports high anxiety on a regular basis. Using cannabis 2 or 3 times a week, bong or blunts, 2 grams a week.  The patient reports having suicidal thoughts with a vague plan to kill himself and homicidal thoughts toward other random people. He reports having these thoughts any time he's getting off drugs. He denied intent saying the consequences of his actions would keep him from acting on thoughts. 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 He stole from [place] recently and has a court date. Other stressors involved his parents pending divorce. He lives with his parents currently. Reports depression and anxiety on maternal side and father had issues with alcohol in the past. The patient appeared depressed, had flat affect, fair eye contact, was cooperative, pleasant, oriented x4. "  Pt has been cooperative with ED staff and seen today on 05/31/16. Staff report that pt has been cooperative and appropriate. Upon evaluation today, he does not appear to be under the influence of psychoactive substances or reactional drugs, which is in contrast to  his arrival. He is lucid at the time of the assessment.   Past Psychiatric History: opiate abuse, benzo abuse, THC abuse  Risk to Self: Suicidal Ideation: No What has been your use of drugs/alcohol within the last 12 months?:  (opiates, cannabis, benzo's) How many times?: 0 Other Self Harm Risks: 0 Intentional Self Injurious Behavior: None Risk to Others: Homicidal Ideation: No Thoughts of Harm  to Others: No Comment - No Current Homicidal Intent: No Current Homicidal Plan: No Describe Current Homicidal Plan:   Access to Homicidal Means: No Describe Access to Homicidal Means:   Identified Victim:  (random people) History of harm to others?: No Assessment of Violence: None Noted Violent Behavior Description:  (n/a) Does patient have access to weapons?:  No Criminal Charges Pending?: Yes with court date Does patient have a court date: Yes Court Date: 06/08/16 Prior Inpatient Therapy: Prior Inpatient Therapy: Yes Prior Therapy Dates:  (1 yr ago) Prior Therapy Facilty/Provider(s):  [facility]) Reason for Treatment:  (drug treatment) Prior Outpatient Therapy: Prior Outpatient Therapy: No Does patient have an ACCT team?: No Does patient have Intensive In-House Services?  : No Does patient have [facility] services? : No Does patient have P4CC services?: No  Past Medical History: History reviewed. No pertinent past medical history. History reviewed. No pertinent surgical history. Family History:  Family History  Problem Relation Age of Onset  . Depression Mother     anxiety  . Diabetes Maternal Grandfather    Family Psychiatric  History: denies Social History:  History  Alcohol Use No     History  Drug Use    Social History   Social History  . Marital status: Single    Spouse name: N/A  . Number of children: N/A  . Years of education: N/A   Social History Main Topics  . Smoking status: Former Research scientist (life sciences)  . Smokeless tobacco: Former Systems developer    Types: Chew     Comment: only experimented  . Alcohol use No  . Drug use: Yes  . Sexual activity: Not Asked   Other Topics Concern  . None   Social History Narrative  . None   Additional Social History:    Allergies:   Allergies  Allergen Reactions  . Buspar [Buspirone] Nausea Only    Dizziness (also)    Labs:  Results for orders placed or performed during the hospital encounter of 05/30/16 (from the past  48 hour(s))  Comprehensive metabolic panel     Status: Abnormal   Collection Time: 05/30/16  5:10 PM  Result Value Ref Range   Sodium 141 135 - 145 mmol/L   Potassium 3.2 (L) 3.5 - 5.1 mmol/L   Chloride 104 101 - 111 mmol/L   CO2 24 22 - 32 mmol/L   Glucose, Bld 98 65 - 99 mg/dL   BUN 11 6 - 20 mg/dL   Creatinine, Ser 0.78 0.61 - 1.24 mg/dL   Calcium 10.1 8.9 - 10.3 mg/dL   Total Protein 8.2 (H) 6.5 - 8.1 g/dL   Albumin 5.2 (H) 3.5 - 5.0 g/dL   AST 18 15 - 41 U/L   ALT 13 (L) 17 - 63 U/L   Alkaline Phosphatase 68 38 - 126 U/L   Total Bilirubin 0.7 0.3 - 1.2 mg/dL   GFR calc non Af Amer >60 >60 mL/min   GFR calc Af Amer >60 >60 mL/min    Comment: (NOTE) The eGFR has been calculated using the CKD EPI equation. This calculation has not  been validated in all clinical situations. eGFR's persistently <60 mL/min signify possible Chronic Kidney Disease.    Anion gap 13 5 - 15  Ethanol     Status: None   Collection Time: 05/30/16  5:10 PM  Result Value Ref Range   Alcohol, Ethyl (B) <5 <5 mg/dL    Comment:        LOWEST DETECTABLE LIMIT FOR SERUM ALCOHOL IS 5 mg/dL FOR MEDICAL PURPOSES ONLY   cbc     Status: None   Collection Time: 05/30/16  5:10 PM  Result Value Ref Range   WBC 9.8 4.0 - 10.5 K/uL   RBC 5.30 4.22 - 5.81 MIL/uL   Hemoglobin 15.1 13.0 - 17.0 g/dL   HCT 44.3 39.0 - 52.0 %   MCV 83.6 78.0 - 100.0 fL   MCH 28.5 26.0 - 34.0 pg   MCHC 34.1 30.0 - 36.0 g/dL   RDW 13.5 11.5 - 15.5 %   Platelets 283 150 - 400 K/uL  Rapid urine drug screen (hospital performed)     Status: Abnormal   Collection Time: 05/30/16  6:31 PM  Result Value Ref Range   Opiates POSITIVE (A) NONE DETECTED   Cocaine NONE DETECTED NONE DETECTED   Benzodiazepines POSITIVE (A) NONE DETECTED   Amphetamines NONE DETECTED NONE DETECTED   Tetrahydrocannabinol POSITIVE (A) NONE DETECTED   Barbiturates NONE DETECTED NONE DETECTED    Comment:        DRUG SCREEN FOR MEDICAL PURPOSES ONLY.  IF  CONFIRMATION IS NEEDED FOR ANY PURPOSE, NOTIFY LAB WITHIN 5 DAYS.        LOWEST DETECTABLE LIMITS FOR URINE DRUG SCREEN Drug Class       Cutoff (ng/mL) Amphetamine      1000 Barbiturate      200 Benzodiazepine   161 Tricyclics       096 Opiates          300 Cocaine          300 THC              50     Current Facility-Administered Medications  Medication Dose Route Frequency Provider Last Rate Last Dose  . cloNIDine (CATAPRES) tablet 0.1 mg  0.1 mg Oral QID Margette Fast, MD   0.1 mg at 05/31/16 1435   Followed by  . [START ON 06/02/2016] cloNIDine (CATAPRES) tablet 0.1 mg  0.1 mg Oral BH-qamhs Margette Fast, MD       Followed by  . [START ON 06/05/2016] cloNIDine (CATAPRES) tablet 0.1 mg  0.1 mg Oral QAC breakfast Margette Fast, MD      . dicyclomine (BENTYL) tablet 20 mg  20 mg Oral Q6H PRN Margette Fast, MD   20 mg at 05/31/16 1435  . hydrOXYzine (ATARAX/VISTARIL) tablet 25 mg  25 mg Oral Q6H PRN Margette Fast, MD      . loperamide (IMODIUM) capsule 2-4 mg  2-4 mg Oral PRN Margette Fast, MD      . methocarbamol (ROBAXIN) tablet 500 mg  500 mg Oral Q8H PRN Margette Fast, MD      . naproxen (NAPROSYN) tablet 500 mg  500 mg Oral BID PRN Margette Fast, MD      . ondansetron [facility]) tablet 4 mg  4 mg Oral Q8H PRN Recardo Evangelist, PA-C   4 mg at 05/31/16 1435   Current Outpatient Prescriptions  Medication Sig Dispense Refill  . escitalopram (LEXAPRO) 20 MG tablet  Take 1 tablet (20 mg total) by mouth daily. Take in PM (Patient taking differently: Take 20 mg by mouth daily. ) 30 tablet 5  . buPROPion (WELLBUTRIN XL) 150 MG 24 hr tablet Take 1 tablet (150 mg total) by mouth daily. Take in am (Patient not taking: Reported on 05/30/2016) 30 tablet 5  . QUEtiapine (SEROQUEL) 25 MG tablet Take 1-2 tablets (25-50 mg total) by mouth at bedtime. 1-2 at 8-9 pm (Patient not taking: Reported on 05/30/2016) 60 tablet 5  . triamcinolone cream (KENALOG) 0.1 % Apply 1 application topically 4 (four)  times daily. (Patient not taking: Reported on 05/30/2016) 45 g 0    Musculoskeletal: Strength & Muscle Tone: within normal limits Gait & Station: normal Patient leans: N/A  Psychiatric Specialty Exam: Physical Exam  Review of Systems  Psychiatric/Behavioral: Positive for depression and substance abuse. Negative for hallucinations and suicidal ideas. The patient is not nervous/anxious and does not have insomnia.   All other systems reviewed and are negative.   Blood pressure 124/70, pulse 82, temperature 98 F (36.7 C), temperature source Oral, resp. rate 16, height 5' 9"  (1.753 m), weight 82.7 kg (182 lb 6 oz), SpO2 100 %.Body mass index is 26.93 kg/m.  General Appearance: Casual and Fairly Groomed  Eye Contact:  Good  Speech:  Clear and Coherent and Normal Rate  Volume:  Normal  Mood:  Depressed  Affect:  Appropriate, Congruent and Depressed  Thought Process:  Coherent, Goal Directed, Linear and Descriptions of Associations: Intact  Orientation:  Full (Time, Place, and Person)  Thought Content:  Focused on opiate resources  Suicidal Thoughts:  No  Homicidal Thoughts:  No  Memory:  Immediate;   Fair Recent;   Fair Remote;   Fair  Judgement:  Fair  Insight:  Good  Psychomotor Activity:  Normal  Concentration:  Concentration: Fair and Attention Span: Fair  Recall:  [employer] of Knowledge:  Fair  Language:  Fair  Akathisia:  No  Handed:    AIMS (if indicated):     Assets:  Communication Skills Desire for Improvement Resilience Social Support  ADL's:  Intact  Cognition:  WNL  Sleep:      Treatment Plan Summary: Substance induced mood disorder ([place]) stable for outpatient management with opiate resources (inpatient or outpatient as pt is flexible)   Disposition: No evidence of imminent risk to self or others at present.   Patient does not meet criteria for psychiatric inpatient admission. Supportive therapy provided about ongoing stressors. Refer to IOP. Discussed  crisis plan, support from social network, calling 911, coming to the Emergency Department, and calling Suicide Hotline.  Benjamine Mola,  05/31/2016 4:27 PM

## 2016-05-31 NOTE — Progress Notes (Signed)
Received call from pt's mother Caralyn Guileenny Terrien [phone]. She asks for clarification of "how he was told he would be admitted inpatient last night and then he's being discharged today." CSW explained reassessment process and also the criteria for inpatient psychiatric care. Mother understanding, asks for options to help pt with his substance use issues. States pt was treated at [facility] a couple of months ago, are unsure if he would need residential services again at this time and mom states she and patient are highly interested [facility]. CSW provided mom with contact information to arrange scheduling intake assessment to determine if this level of care is appropriate for pt's current needs. Mom receptive.  Also provided her with information for [facility] as alternate resource should pt have need for residential treatment in the future.  Mother thanked CSW for assistance. Provided contact for [facility] in pt's d/c instructions as well.  Ilean SkillMeghan Calyb Mcquarrie, MSW, LCSW Clinical Social Work, Disposition  05/31/2016 [phone]

## 2016-05-31 NOTE — Progress Notes (Signed)
CSW contacted Patient's mother regarding Patient being cleared by psychiatry. Patient's mother expressed concerns with Patient being recommended for inpatient psych hospitalization on last night and now being cleared by psych on this afternoon and inquired about what changed. CSW explained to Patient's mother that substance abuse does not necessitate inpatient psychiatric hospitalization. Patient's mother expressed understanding. CSW discussed substance abuse resources and Patient's mother expressed interest in [facility] Intensive outpatient program. Patient's mother reports that Patient has been to [facility] in the past. Patient's mother reports that they have been attempting to get Patient an outpatient appointment with [facility], however, reports that they have had no success. CSW has provided Patient's mother with contact information for [facility] to assist w/ scheduling an initial appointment for the IOP program. CSW signing off at this time. Please contact should new need(s) arise.    Enos FlingAshley Aubrina Nieman, MSW, LCSW [facility]/71M Clinical Social Worker [phone]

## 2016-06-01 ENCOUNTER — Telehealth (HOSPITAL_COMMUNITY): Payer: Self-pay | Admitting: Psychology

## 2016-07-26 ENCOUNTER — Other Ambulatory Visit: Payer: Self-pay | Admitting: General Practice

## 2016-07-26 MED ORDER — ESCITALOPRAM OXALATE 20 MG PO TABS: 20.0000 mg | ORAL_TABLET | Freq: Every day | ORAL | 2 refills | Status: DC

## 2016-09-14 ENCOUNTER — Ambulatory Visit: Payer: BLUE CROSS/BLUE SHIELD

## 2016-09-14 ENCOUNTER — Encounter: Payer: Self-pay | Admitting: Orthopedic Surgery

## 2016-09-14 ENCOUNTER — Ambulatory Visit (INDEPENDENT_AMBULATORY_CARE_PROVIDER_SITE_OTHER): Payer: BLUE CROSS/BLUE SHIELD

## 2016-09-14 ENCOUNTER — Ambulatory Visit (INDEPENDENT_AMBULATORY_CARE_PROVIDER_SITE_OTHER): Payer: BLUE CROSS/BLUE SHIELD | Admitting: Orthopedic Surgery

## 2016-09-14 VITALS — BP 127/81 | HR 72 | Ht 70.0 in | Wt 190.0 lb

## 2016-09-14 DIAGNOSIS — M79671 Pain in right foot: Secondary | ICD-10-CM

## 2016-09-14 DIAGNOSIS — M25571 Pain in right ankle and joints of right foot: Secondary | ICD-10-CM

## 2016-09-14 DIAGNOSIS — S93324A Dislocation of tarsometatarsal joint of right foot, initial encounter: Secondary | ICD-10-CM

## 2016-09-14 MED ORDER — TRAMADOL-ACETAMINOPHEN 37.5-325 MG PO TABS
1.0000 | ORAL_TABLET | ORAL | 0 refills | Status: DC | PRN
Start: 2016-09-14 — End: 2016-10-19

## 2016-09-14 MED ORDER — IBUPROFEN 800 MG PO TABS: 800.0000 mg | ORAL_TABLET | Freq: Three times a day (TID) | ORAL | 1 refills | Status: DC | PRN

## 2016-09-14 NOTE — Patient Instructions (Addendum)
Lisfranc Midfoot Injury A Lisfranc midfoot injury is a break (fracture), separation (dislocation), or sprain involving the bones and joints in the middle of your foot. Your midfoot is made up of a cluster of small bones (tarsals) that attach to the long bones going to your toes (metatarsals). Strong bands of tissue (ligaments) attach the bones of your midfoot to each other. A Lisfranc midfoot injury can include:  Ligament damage or tears.  Bone fractures.  Dislocations.  A combination of these injuries.  This type of injury can cause foot pain and instability. The condition can range from mild to severe. What are the causes? This condition may be caused by:  An injury that twists your foot forcefully.  Tripping or falling over your foot.  Falling from a height.  A hard, direct hit or a crushing injury to your foot.  What increases the risk? This condition is more likely to develop in athletes who participate in:  Contact sports, especially while wearing cleats, like in football.  Activities in which your foot is loaded in a pointed position like dance and gymnastics.  What are the signs or symptoms? Symptoms of this condition include:  Foot pain that gets worse with standing or walking.  Foot swelling.  Bruising on the top or bottom of the foot.  Pain when pressing on the top or bottom of the foot (tenderness).  Seeing or feeling a new bump on the top of your foot.  How is this diagnosed? This condition is diagnosed based on your symptoms and medical history, especially if you recently had an injury. Your health care provider will also do a physical exam to check for bruising under your foot andmove your toes to test for pain. You may be asked if you can stand up on tiptoe. You may also have imaging studies done, including:  X-rays.  CT scans.  MRI.  How is this treated? The first treatments for ligament injuries that do not cause instability or dislocation of  the midfoot are usually nonsurgical. These may include:  Using a boot or cast to keep your foot still and protect it while it heals (immobilization). You may need to wear this for at least 6 weeks or as told by your health care provider.  Using crutches to keep weight off your foot.  Physical therapy to improve motion and strength.  Medicine for pain.  Surgery is the treatment for fractures, dislocations, and unstable ligament injuries. This may include ligament repair, joint fusion, or a procedure to stabilize the fracture using screws or plates. After surgery, you will have to wear a cast and eventually have physical therapy. Follow these instructions at home: If you have a boot:  Wear the boot as told by your health care provider. Remove it only as told by your health care provider.  Loosen the boot if your toes tingle, become numb, or turn cold and blue.  Do not let your boot get wet if it is not waterproof.  Keep the boot clean. If you have a cast:  Do not stick anything inside the cast to scratch your skin. Doing that increases your risk of infection.  Check the skin around the cast every day. Tell your health care provider about any concerns.  You may put lotion on dry skin around the edges of the cast. Do not put lotion on the skin underneath the cast.  Do not let your cast get wet if it is not waterproof.  Keep the cast clean.  Bathing  Do not take baths, swim, or use a hot tub until your health care provider approves. Ask your health care provider if you can take showers. You may only be allowed to take sponge baths for bathing.  If your boot or cast is not waterproof, protect it with a watertight covering when you take a bath or a shower. Managing pain, stiffness, and swelling  If directed, apply ice to the injured area. ? If you have a removable boot, remove it as told by your health care provider. ? Put ice in a plastic bag. ? Place a towel between the bag and  your skin or between the bag and your cast. ? Leave the ice on for 20 minutes, 2-3 times a day.  Move your toes often to avoid stiffness and to lessen swelling.  Raise (elevate) the injured area above the level of your heart while you are sitting or lying down. Driving  Do not drive or operate heavy machinery while taking prescription pain medicine.  Ask your health care provider when it is safe to drive if you have a boot or cast on your foot. Activity  Return to your normal activities as told by your health care provider. Ask your health care provider what activities are safe for you.  Do exercises as told by your health care provider. Safety  Do not use the injured limb to support your body weight until your health care provider says that you can. Use your crutches as told by your health care provider. General instructions  Do not put pressure on any part of the cast until it is fully hardened. This may take several hours.  Do not use any tobacco products, such as cigarettes, chewing tobacco, and e-cigarettes. Tobacco can delay healing. If you need help quitting, ask your health care provider.  Take over-the-counter and prescription medicines only as told by your health care provider.  Keep all follow-up visits as told by your health care provider. This is important. How is this prevented?  Make sure to use equipment that fits you.  Use footwear that is appropriate for your athletic activity and the playing surface.  Be safe and responsible while being active to avoid falls. Contact a health care provider if:  Your pain medicine and rest are not helping. Get help right away if:  Your foot becomes very painful or numb.  Your toes become very pale or turn blue. This information is not intended to replace advice given to you by your health care provider. Make sure you discuss any questions you have with your health care provider. Document Released: 03/07/2005 Document  Revised: 11/10/2015 Document Reviewed: 11/19/2014 Elsevier Interactive Patient Education  [employer].

## 2016-09-14 NOTE — Progress Notes (Signed)
NEW PATIENT OFFICE VISIT    Chief Complaint  Patient presents with  . Ankle Injury    right foot and ankle pain and swelling, DOI 09/08/16    26 year old male presents with a 1 week history of pain in his right foot  He jumped off a 3 foot wall came down on it awkwardly felt acute sharp pain in his right foot which is worsened over the last week including swelling loss of motion and inability to weight-bear. Tylenol has been used for pain he tried to use some crutches but had axillary pain    Review of Systems  Musculoskeletal: Positive for joint pain.  Neurological: Negative for tingling.  All other systems reviewed and are negative.   No major medical problems such as hypertension diabetes  No previous surgeries   Family History  Problem Relation Age of Onset  . Depression Mother        anxiety  . Diabetes Maternal Grandfather    Social History  Substance Use Topics  . Smoking status: Current Every Day Smoker  . Smokeless tobacco: Former Neurosurgeon    Types: Chew     Comment: only experimented  . Alcohol use No    BP 127/81   Pulse 72   Ht 5\' 10"  (1.778 m)   Wt 190 lb (86.2 kg)   BMI 27.26 kg/m   Physical Exam  Constitutional: He is oriented to person, place, and time. He appears well-developed and well-nourished.  Vital signs have been reviewed and are stable. Gen. appearance the patient is well-developed and well-nourished with normal grooming and hygiene.   Musculoskeletal:  He can only put weight on his heel on his injured right foot  Neurological: He is alert and oriented to person, place, and time.  Skin: Skin is warm and dry. No erythema.  Psychiatric: He has a normal mood and affect.  Vitals reviewed.   Left Ankle Exam   Tenderness  None  Range of Motion  Normal left ankle ROM  Muscle Strength  Normal left ankle strength Dorsiflexion:      5/5 Plantar flexion:      5/5 Anterior tibial:        Posterior tibial:      Gastrosoleus:        Peroneal muscle:  Tests  Anterior drawer: Negative.  Right Ankle Exam  Swelling: None  Tenderness  The patient is experiencing tenderness in the Lisranc joint.  Range of Motion  Dorsiflexion:   Normal Plantar flexion: Normal Inversion:         Eversion:          Muscle Strength  Dorsiflexion:     4/5 Plantar flexion:     4/5 Anterior tibial:       Posterior tibial:     Gastrosoleus:      Peroneal muscle:  Tests  Anterior drawer: NegativeComments The patient has tenderness over the Lisfranc joint  It feels unstable to vertical stress testing. The tenderness extends across the midfoot. He has a lot of ecchymosis and swelling. The ankle joint moves fairly normally with no significant tenderness over the lateral or medial malleolus      Meds ordered this encounter  Medications  . ibuprofen (ADVIL,MOTRIN) 800 MG tablet    Sig: Take 1 tablet (800 mg total) by mouth every 8 (eight) hours as needed.    Dispense:  90 tablet    Refill:  1  . traMADol-acetaminophen (ULTRACET) 37.5-325 MG tablet    Sig: Take 1  tablet by mouth every 4 (four) hours as needed.    Dispense:  42 tablet    Refill:  0    Encounter Diagnoses  Name Primary?  . Right foot pain   . Acute right ankle pain   . Dislocation of tarsometatarsal joint of right foot, initial encounter Yes   X-ray shows normal ankle 3 views  3 views of the right foot show abnormality at tarsometatarsal joint numbers 1 and 2  PLAN:   CT scan to delineate Lisfranc joint  Ibuprofen and tramadol (May have some opioid issues )  Continue ice and elevation

## 2016-09-20 ENCOUNTER — Ambulatory Visit (HOSPITAL_COMMUNITY): Payer: BLUE CROSS/BLUE SHIELD

## 2016-10-13 ENCOUNTER — Ambulatory Visit (INDEPENDENT_AMBULATORY_CARE_PROVIDER_SITE_OTHER): Payer: BLUE CROSS/BLUE SHIELD | Admitting: Psychology

## 2016-10-13 DIAGNOSIS — F1320 Sedative, hypnotic or anxiolytic dependence, uncomplicated: Secondary | ICD-10-CM | POA: Diagnosis not present

## 2016-10-14 ENCOUNTER — Encounter (HOSPITAL_COMMUNITY): Payer: Self-pay | Admitting: Psychology

## 2016-10-14 DIAGNOSIS — F1320 Sedative, hypnotic or anxiolytic dependence, uncomplicated: Secondary | ICD-10-CM | POA: Insufficient documentation

## 2016-10-14 NOTE — Progress Notes (Addendum)
The patient is a 26 yo single, white, male seeking treatment to address his opioid dependence. He lives with his father in [place], [place]. This counselor had met with the patient and his mother 6 months ago, but he denied interest in entering the CD-IOP at that time. The patient has been using drugs for approximately three years. He started as a recreational user with friends. However, the patient's use quickly increased and he continues to use opiates, xanax, cannabis and occasionally cocaine. He is buying everything on the street. He has had numerous negative consequences, including an arrest for attempting to steal video games from [place] in November of 2017. He admitted he was high at the time and it was a stupid thing to do. The patient's parents' appear to have been able to resolve this charge and it has since been expunged from his record. 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 The patient admitted he has stolen money from work in order to support his habit. His drug use has caused considerable unrest within his family and both parents (they divorced two years ago) are concerned. The patient is an only child and he admitted their divorce was a total surprise to him and very upsetting. His drug use helped ease the emotional pain of their split. The patient has detoxed himself on numerous occasions and seems very familiar with the discomfort that detox entails. His first treatment experience was last year. The patient entered [facility] and completed treatment in June of 2017. He reported he stayed clean for 60 days before relapsing. When asked what led to the return to use, the patient was unable to identify any specific reason. He has attended AA/NA and was living in an [place] for a very brief time. He had moved in, but a week later, when drug tested, he remained  positive for benzos (despite insisting that he hadn't use within that week) and was forced to leave. When asked about any genetic predisposition, the patient reported, "My father was an alcoholic, but he quite drinking completely a number of years ago". However, the patient also reported, "Recently, my father has begun drinking beer again". The patient reported he began to struggle with anxiety in high school and described it as 'social anxiety'. He would experience 'mini-panic attacks'. He has been taking Lexapro to address the anxiety, but he admitted he is uncertain whether it is helpful. His PCP, Alex Plotnikov, has managed his medication and is aware of the patient's addiction. Other medications prescribed in the past, that proved ineffective, include Wellbutrin and Seroquel. On March 12 of this year, the patient presented at the [facility] ED accompanied by his parents. He was diagnosed with opioid dependence and passive SI. He was detoxing and felt terrible. There had been no self injurious behaviors or prior suicide attempts. He was stabilized and discharged the next day. On June 27, the patient met with an orthopedic specialist in [place]. He had jumped down from a three foot wall at work and landed wrongly (he denied being high at the time). He had dislocated bones in his foot. Per the patient's report, he told the specialist he was opiate dependent and was given a 'non-narcotic' pain medication, which he stated did not help alleviate or reduce the pain. Today, the patient states "I am sick of feeling like this" and displays a  degree of motivation not previously witnessed by this counselor. When asked about MAT, the patient reported he had taken Suboxone in the past, but had misused it and sold it and really didn't use it appropriately. When asked if this would be of interest to him today, the patient denied interest in MAT. He reported he had taken Naltrexone before, but it seems unlikely it proved  helpful since he has had such little experience with total abstinence. The patient agreed to return on Tuesday, complete the orientation and enter the CD-IOP on Wednesday, August 1. Based on the patient's history of opiate use, it remains to be seen whether he can attain sobriety without entering residential treatment or seeking some sort of MAT.

## 2016-10-19 ENCOUNTER — Encounter (HOSPITAL_COMMUNITY): Payer: Self-pay | Admitting: Medical

## 2016-10-19 ENCOUNTER — Other Ambulatory Visit (HOSPITAL_COMMUNITY): Payer: BLUE CROSS/BLUE SHIELD | Attending: Psychiatry | Admitting: Psychology

## 2016-10-19 VITALS — BP 122/74 | HR 74 | Ht 71.0 in | Wt 184.4 lb

## 2016-10-19 DIAGNOSIS — R45851 Suicidal ideations: Secondary | ICD-10-CM | POA: Diagnosis not present

## 2016-10-19 DIAGNOSIS — F19922 Other psychoactive substance use, unspecified with intoxication with perceptual disturbance: Secondary | ICD-10-CM | POA: Diagnosis not present

## 2016-10-19 DIAGNOSIS — F1320 Sedative, hypnotic or anxiolytic dependence, uncomplicated: Secondary | ICD-10-CM | POA: Diagnosis not present

## 2016-10-19 DIAGNOSIS — Z833 Family history of diabetes mellitus: Secondary | ICD-10-CM | POA: Diagnosis not present

## 2016-10-19 DIAGNOSIS — F1120 Opioid dependence, uncomplicated: Secondary | ICD-10-CM

## 2016-10-19 DIAGNOSIS — F1910 Other psychoactive substance abuse, uncomplicated: Secondary | ICD-10-CM | POA: Diagnosis not present

## 2016-10-19 DIAGNOSIS — F4010 Social phobia, unspecified: Secondary | ICD-10-CM | POA: Insufficient documentation

## 2016-10-19 DIAGNOSIS — Z9114 Patient's other noncompliance with medication regimen: Secondary | ICD-10-CM | POA: Insufficient documentation

## 2016-10-19 DIAGNOSIS — R4182 Altered mental status, unspecified: Secondary | ICD-10-CM | POA: Insufficient documentation

## 2016-10-19 DIAGNOSIS — G4700 Insomnia, unspecified: Secondary | ICD-10-CM

## 2016-10-19 DIAGNOSIS — R45850 Homicidal ideations: Secondary | ICD-10-CM | POA: Diagnosis not present

## 2016-10-19 DIAGNOSIS — F17200 Nicotine dependence, unspecified, uncomplicated: Secondary | ICD-10-CM | POA: Insufficient documentation

## 2016-10-19 DIAGNOSIS — Z79899 Other long term (current) drug therapy: Secondary | ICD-10-CM | POA: Diagnosis not present

## 2016-10-19 DIAGNOSIS — R4781 Slurred speech: Secondary | ICD-10-CM | POA: Diagnosis not present

## 2016-10-19 DIAGNOSIS — F1994 Other psychoactive substance use, unspecified with psychoactive substance-induced mood disorder: Secondary | ICD-10-CM | POA: Insufficient documentation

## 2016-10-19 DIAGNOSIS — Z888 Allergy status to other drugs, medicaments and biological substances status: Secondary | ICD-10-CM | POA: Insufficient documentation

## 2016-10-19 DIAGNOSIS — F411 Generalized anxiety disorder: Secondary | ICD-10-CM | POA: Diagnosis not present

## 2016-10-19 DIAGNOSIS — Z9119 Patient's noncompliance with other medical treatment and regimen: Secondary | ICD-10-CM | POA: Insufficient documentation

## 2016-10-19 DIAGNOSIS — Z818 Family history of other mental and behavioral disorders: Secondary | ICD-10-CM | POA: Diagnosis not present

## 2016-10-19 MED ORDER — NALTREXONE 380 MG IM SUSR: 380.0000 mg | INTRAMUSCULAR | 2 refills | Status: AC

## 2016-10-19 MED ORDER — QUETIAPINE FUMARATE 25 MG PO TABS: ORAL_TABLET | ORAL | 2 refills | Status: DC

## 2016-10-19 MED ORDER — PROPRANOLOL HCL 10 MG PO TABS: 10.0000 mg | ORAL_TABLET | Freq: Three times a day (TID) | ORAL | 0 refills | Status: DC

## 2016-10-19 MED ORDER — ESCITALOPRAM OXALATE 20 MG PO TABS: 20.0000 mg | ORAL_TABLET | Freq: Every day | ORAL | 1 refills | Status: DC

## 2016-10-19 NOTE — Progress Notes (Addendum)
Psychiatric Initial Adult Assessment   Patient Identification: Nicholas Harrison:  [id number] Date of Evaluation:  10/19/2016 Referral Source: Nicholas Harrison MSW,LCSW/Nicholas Harrison Chief Complaint:   Chief Complaint    Addiction Problem; Establish Care; Anxiety; Dysthymia     Visit Diagnosis:    ICD-10-CM   1. Opioid use disorder, severe, dependence ([place]) F11.20   2. Benzodiazepine dependence, episodic (HCC) F13.20   3. Polysubstance abuse F19.10    Cocaine and THC  4. 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 Pt had completed a 30 day inpatient program at [facility] in [place] in March of 2017 but failed to complete IOP aftercare program due to relapse. Since then his life has gotten worse with criminal charges for stealing that were expunged and threatened loss of job and eviction from home by father with whom he lives and who employs him.  In March the pt sought inpatient admission and was referred to CD IOP but chose not to enter program.With threat of loss of income and place to live he returned and met with Counselor   Author: Brandon Harrison, LCAS Author Type: Licensed Clinical Addiction Specialist Filed: 10/14/2016 10:33 AM    The patient is a 26 yo single, white, male seeking treatment to address his opioid dependence. He lives with his father in [place], [place]. This counselor had met with the patient and his mother 6 months ago, but he denied interest in entering the CD-IOP at that time. The patient has been using drugs for approximately three years. He started as a recreational user with friends. However, the patient's use quickly increased and he continues to use opiates, xanax,  cannabis and occasionally cocaine. He is buying everything on the street. He has had numerous negative consequences, including an arrest for attempting to steal video games from [place] in November of 2017. He admitted he was high at the time and it was a stupid thing to do. The patient's parents' appear to have been able to resolve this charge and it has since been expunged from his record. 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 The patient admitted he has stolen money from work in order to support his habit. His drug use has caused considerable unrest within his family and both parents (they divorced two years ago) are concerned. The patient is an only child and he admitted their divorce was a total surprise to him and very upsetting. His drug use helped ease the emotional pain of their split. The patient has detoxed himself on numerous occasions and seems very familiar with the discomfort that detox entails. His first treatment experience was last year. The patient entered [facility] and completed treatment in June of 2017. He reported he stayed clean for 60 days before relapsing. When asked what led to the return to use, the patient was unable to identify any specific reason. He has attended AA/NA and was living in an [place] for a very brief time. He had moved in, but a week later, when drug tested, he remained positive for benzos (despite insisting that he hadn't use within  that week) and was forced to leave. When asked about any genetic predisposition, the patient reported, "My father was an alcoholic, but he quite drinking completely a number of years ago". However, the patient also reported, "Recently, my father has begun drinking beer again". The patient reported he began to struggle with anxiety in high school and described it as 'social anxiety'. He would  experience 'mini-panic attacks'. He has been taking Lexapro to address the anxiety, but he admitted he is uncertain whether it is helpful. His PCP, Nicholas Harrison, has managed his medication and is aware of the patient's addiction. Other medications prescribed in the past, that proved ineffective, include Wellbutrin and Seroquel. On March 12 of this year, the patient presented at the [facility] ED accompanied by his parents. He was diagnosed with opioid dependence and passive SI. He was detoxing and felt terrible. There had been no self injurious behaviors or prior suicide attempts. He was stabilized and discharged the next day. On June 27, the patient met with an orthopedic specialist in [place]. He had jumped down from a three foot wall at work and landed wrongly (he denied being high at the time). He had dislocated bones in his foot. Per the patient's report, he told the specialist he was opiate dependent and was given a 'non-narcotic' pain medication, which he stated did not help alleviate or reduce the pain. Today, the patient states "I am sick of feeling like this" and displays a degree of motivation not previously witnessed by this counselor. When asked about MAT, the patient reported he had taken Suboxone in the past, but had misused it and sold it and really didn't use it appropriately. When asked if this would be of interest to him today, the patient denied interest in MAT. He reported he had taken Naltrexone before, but it seems unlikely it proved helpful since he has had such little experience with total abstinence. The patient agreed to return on Tuesday, complete the orientation and enter the CD-IOP on Wednesday, August 1. Based on the patient's history of opiate use, it remains to be seen whether he can attain sobriety without entering residential treatment or seeking some sort of MAT.      TODAY he is willing to to try Vivitrol as an MAT and is requesting help with anxiety as he can no longer  retreat to Xanax to self medicate. He was unaware of the mechanism of addiction in the brain which is whyabstinence is so effective. He admits he tried Naltrexone pills in the past but he also admits he cant trust himself to take the medication daily   Associated Signs/Symptoms: DSM V Criteria 9/11 + = Opiate Use Disorder Severe Dependence  AUDIT negative Score 7 Depression Symptoms:   depressed mood,1 anhedonia,1 psychomotor retardation,2 difficulty concentrating,2 disturbed sleep,1 decreased appetite,2 Very Difficult PHQ9 score 11 (Hypo) Manic Symptoms: Substance induced  Impulsivity, Irritable Mood, Labiality of Mood, Anxiety Symptoms:  Social Anxiety started in childhood, See GAD 7 Score 12 Very Difficult Psychotic Symptoms:  NA PTSD Symptoms: Pt denies but parents divorce was very traumatic for him 2 yrs ago and escalated his use Past Psychiatric History: OUTPATIENT:   CDIOP-[facility] 4 weeks post discharge April 2017 failed to complete-discharged for  [facility] DEPT Arrival date & time: 05/30/16  1559 History              Chief Complaint Chief Complaint  Patient presents with  . Psychiatric Evaluation  HPI Nicholas Harrison is a 26 y.o.  male who presents with opioid dependence and passive SI. No significant PMH. His parents are at bedside and provide most of history. He has been using opiates since 2011. He has SI when he does not use and has tried to detox on his own without success. No self injurious behaviors or prior suicide attempts. He previously tried inpatient detox and [facility] last February but started to use again once he got out. He has also tried outpatient medicines with no success. He is on Lexapro for depression which has not helped. He has also tried Seroquel, Wellbutrin. No pain/physical complaints other than irritability.      Results for Nicholas Harrison, Nicholas Harrison (Harrison [id number]) as of 10/19/2016 16:05  Ref. Range 05/30/2016 17:10 05/30/2016 18:31  09/14/2016 16:06 09/14/2016 16:06  Amphetamines Latest Ref Range: NONE DETECTED   NONE DETECTED    Barbiturates Latest Ref Range: NONE DETECTED   NONE DETECTED    Benzodiazepines Latest Ref Range: NONE DETECTED   POSITIVE (A)    Opiates Latest Ref Range: NONE DETECTED   POSITIVE (A)    COCAINE Latest Ref Range: NONE DETECTED   NONE DETECTED    Tetrahydrocannabinol Latest Ref Range: NONE DETECTED   POSITIVE (A)    Results for Nicholas Harrison, Nicholas Harrison (Harrison [id number]) as of 10/19/2016 16:05  Ref. Range 05/30/2016 17:10 05/30/2016 18:31 09/14/2016 16:06 09/14/2016 16:06  Alcohol, Ethyl (B) Latest Ref Range: <5 mg/dL <5      [facility] Consult 05/31/2016 4:27 PM Reason for Consult:  Transient statements of suicidal/homicidal ideation when high on drugs Referring Physician:  EDP Patient Identification: Nicholas Harrison:  [id number] Principal Diagnosis: Substance induced mood disorder [facility]) Diagnosis:        Patient Active Problem List   Diagnosis Date Noted  . Substance induced mood disorder ([place]) [F19.94] 05/31/2016    Priority: High  . Moderate tetrahydrocannabinol (THC) dependence ([place]) [F12.20] 05/31/2016    Priority: High  . Opioid dependence ([place]) [F11.20] 10/29/2014    Priority: High  . Opioid dependence with opioid-induced mood disorder ([place]) [F11.24]   . ADD (attention deficit disorder) [F98.8] 02/15/2012  . Well adult exam [Z00.00] 09/02/2011  . Rash [R21] 09/02/2011  . Chronic anxiety [F41.9] 06/05/2011  Treatment Plan Summary: Substance induced mood disorder ([place]) stable for outpatient management with opiate resources (inpatient or outpatient as pt is flexibl Disposition: No evidence of imminent risk to self or others at present.   Patient does not meet criteria for psychiatric inpatient admission. Supportive therapy provided about ongoing stressors. Refer to IOP. Discussed crisis plan, support from social network, calling 911, coming to the Emergency  Department, and calling Suicide Hotline. Benjamine Mola, Harrison Hampton Abbot, MD at 06/03/2016 3:13 PM  Attestation signed by Hampton Abbot, MD at 06/03/2016 3:13 PM  Case discussed, agree with plan that patient is not suicidal, homicidal or psychotic and does not need inpatient psychiatric care. Patient can be discharged with outpatient resources    Progress Notes [facility]  Psychiatry   Date of Service: 05/31/2016 3:06 PM  Received call from pt's mother Nicholas Harrison [phone]. She asks for clarification of "how he was told he would be admitted inpatient last night and then he's being discharged today." CSW explained reassessment process and also the criteria for inpatient psychiatric care. Mother understanding, asks for options to help pt with his substance use issues. States pt was treated at [facility] a couple of months ago, are unsure if he would need residential  services again at this time and mom states she and patient are highly interested in [facility] outpatient IOP program. CSW provided mom with contact information to arrange scheduling intake assessment to determine if this level of care is appropriate for pt's current needs. Mom receptive. Also provided her with information for [facility] as alternate resource should pt have need for residential treatment in the future. Mother thanked CSW for assistance. Provided contact for [facility] in pt's d/c instructions as well. Nicholas Harrison, MSW, LCSW Clinical Social Work, Disposition      INPATIENT: March/April 2017 [facility] 30 days  Previous Psychotropic Medications:  Yes  Chronic anxiety A&P Encounter Date: 01/13/2016   d/c Wellbutrin XL for anxiety and stress in am. Cont Lexapro at hs. Start seroquel at a low dose  Internal Medicine Harrison, Evie Lacks, MD   Naltrexone PO MAT 2017-not able to take daily due to addiction     Substance Abuse History in the last 12 months:    Substance Age  of 1st Use Last Use Amount Specific Type  Nicotine 18 Everyday smoker PPD   Alcohol 18 09/12/16 2 Beers  Cannabis 20 10/11/16 Blunt THC/smoked  Opiates 23 10/18/2016 30 mg Oxycontin  Cocaine 23 10/06/2016 Line Snort  Methamphetamines      LSD      Ecstasy      Benzodiazepines 23 10/17/2016 10 mg 2-3 days/wk Xanax  Caffeine      Inhalants      Others:                         Consequences of Substance Abuse: Medical Consequences:  Frequent Doctor visits;IBS,Anxiety;SIMD Legal Consequences:  Stealing under influence Nov 2017 Expunged Court Date: 06/08/16 Family Consequences:  Threatened with being homeless/jobless (works for father) if he doesnt stop/streesful relations with parents Blackouts: + DT's: NA Withdrawal Symptoms:   Cramps Diaphoresis Diarrhea Nausea Vomiting Weakness Irritability ASnxiety  Past Medical History: Uncomplicated opioid dependence Chronic anxiety Past Surgical history  No past surgical history on file.  Family Psychiatric History: Alcoholism Father and PGF                                                      Anxiety /Depression Mother  Family History:  Family History  Problem Relation Age of Onset  . Depression Mother        anxiety  . Diabetes Maternal Grandfather     Social History:   Social History   Social History  . Marital status: Single    Spouse name: N/A  . Number of children: N/A  . Years of education: Associates Degree in Business   Social History Main Topics  . Smoking status: Current Every Day Smoker  . Smokeless tobacco: Former Systems developer    Types: Chew     Comment: only experimented  . Alcohol use See SA chart  . Drug use: Yes  . Sexual activity: Currently inactive   Other Topics Concern  . None   Social History Narrative  . 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 He stole from [place] recently  and has a court date. Other stressors  involved his parents pending divorce. He lives with his parents currently. Reports depression and anxiety on maternal side and father had issues with alcohol in the past. The patient appeared depressed, had flat affect, fair eye contact, was cooperative, pleasant, oriented x4.   Allergies:   Allergies  Allergen Reactions  . Buspar [Buspirone] Nausea Only    Dizziness (also)    Metabolic Disorder Labs: No results found for: HGBA1C,  MPG  Glucose Latest Ref Range: 65 - 99 mg/dL 98   No results found for: PROLACTIN Lab Results  Component Value Date   CHOL 162 08/30/2011   TRIG 328.0 (H) 08/30/2011   HDL 51.00 08/30/2011   CHOLHDL 3 08/30/2011   VLDL 65.6 (H) 08/30/2011     Current Medications: Current Outpatient Prescriptions  Medication Sig Dispense Refill  . escitalopram (LEXAPRO) 20 MG tablet Take 1 tablet (20 mg total) by mouth daily. Take in PM 90 tablet 1  . ibuprofen (ADVIL,MOTRIN) 800 MG tablet Take 1 tablet (800 mg total) by mouth every 8 (eight) hours as needed. 90 tablet 1  . propranolol (INDERAL) 10 MG tablet Take 1 tablet (10 mg total) by mouth 3 (three) times daily. 90 tablet 0  . QUEtiapine (SEROQUEL) 25 MG tablet Take 1-2 tabs at bedtime 60 tablet 2   No current facility-administered medications for this visit.     Neurologic: Headache: with withdrawal Seizure: Negative Paresthesias:Negative  Musculoskeletal: Strength & Muscle Tone: within normal limits Gait & Station: normal Patient leans: N/A  Psychiatric Specialty Exam: Review of Systems  Constitutional: Positive for malaise/fatigue. Negative for chills, diaphoresis, fever and weight loss.  HENT: Negative for congestion, ear discharge, ear pain, hearing loss, nosebleeds, sinus pain, sore throat and tinnitus.   Eyes: Negative for blurred vision, double vision, photophobia, pain, discharge and redness.  Respiratory: Negative for cough, hemoptysis, sputum  production, shortness of breath, wheezing and stridor.   Cardiovascular: Negative for chest pain, palpitations, orthopnea, claudication, leg swelling and PND.  Gastrointestinal: Negative for abdominal pain, blood in stool, constipation, diarrhea, heartburn, melena, nausea and vomiting.  Genitourinary: Negative for dysuria, flank pain, frequency, hematuria and urgency.  Musculoskeletal: Positive for myalgias. Negative for back pain, falls, joint pain and neck pain.  Skin: Negative for itching and rash.  Neurological: Positive for tremors. Negative for dizziness, tingling, sensory change, speech change, focal weakness, seizures, loss of consciousness, weakness and headaches.  Endo/Heme/Allergies: Negative for environmental allergies and polydipsia. Does not bruise/bleed easily.  Psychiatric/Behavioral: Positive for depression and substance abuse. The patient is nervous/anxious and has insomnia.     Blood pressure 122/74, pulse 74, height 5' 11"  (1.803 m), weight 184 lb 6.4 oz (83.6 kg).Body mass index is 25.72 kg/m.  General Appearance: Casual and Neat  Eye Contact:  Good  Speech:  Clear and Coherent  Volume:  Normal  Mood:  Anxious  Affect:  Appropriate and Congruent  Thought Process:  Coherent, Goal Directed and Descriptions of Associations: Intact  Orientation:  Full (Time, Place, and Person)  Thought Content:  WDL and Obsessions  Suicidal Thoughts:  No  Homicidal Thoughts:  No  Memory:  Negative  Judgement:  Impaired  Insight:  Fair  Psychomotor Activity:  Normal  Concentration:  Concentration: intact for visit and Attention Span: Intact for visit  Recall:  Good  Fund of Knowledge:Fair  Language: Good  Akathisia:  NA  Handed:  Right  AIMS (if indicated):  NA  Assets:  Desire for Improvement Financial Resources/Insurance [place]  [facility]  ADL's:  Intact  Cognition: Impaired,  Severe due to opiate dependence  Sleep:  SI sleep  disorder   Results for CASWELL, Nicholas Harrison (Harrison [id number]) as of 10/19/2016 16:05  Ref. Range 05/30/2016 17:10 05/30/2016 18:31 09/14/2016 16:06 09/14/2016 16:06  COMPREHENSIVE METABOLIC PANEL Unknown Rpt (A)     Sodium Latest Ref Range: 135 - 145 mmol/L 141     Potassium Latest Ref Range: 3.5 - 5.1 mmol/L 3.2 (L)     Chloride Latest Ref Range: 101 - 111 mmol/L 104     CO2 Latest Ref Range: 22 - 32 mmol/L 24     Glucose Latest Ref Range: 65 - 99 mg/dL 98     BUN Latest Ref Range: 6 - 20 mg/dL 11     Creatinine Latest Ref Range: 0.61 - 1.24 mg/dL 0.78     Calcium Latest Ref Range: 8.9 - 10.3 mg/dL 10.1     Anion gap Latest Ref Range: 5 - 15  13     Alkaline Phosphatase Latest Ref Range: 38 - 126 U/L 68     Albumin Latest Ref Range: 3.5 - 5.0 g/dL 5.2 (H)     AST Latest Ref Range: 15 - 41 U/L 18     ALT Latest Ref Range: 17 - 63 U/L 13 (L)     Total Protein Latest Ref Range: 6.5 - 8.1 g/dL 8.2 (H)     Total Bilirubin Latest Ref Range: 0.3 - 1.2 mg/dL 0.7     GFR, Est African American Latest Ref Range: >60 mL/min >60     GFR, Est Non African American Latest Ref Range: >60 mL/min >60     WBC Latest Ref Range: 4.0 - 10.5 K/uL 9.8     RBC Latest Ref Range: 4.22 - 5.81 MIL/uL 5.30     Hemoglobin Latest Ref Range: 13.0 - 17.0 g/dL 15.1     HCT Latest Ref Range: 39.0 - 52.0 % 44.3     MCV Latest Ref Range: 78.0 - 100.0 fL 83.6     MCH Latest Ref Range: 26.0 - 34.0 pg 28.5     MCHC Latest Ref Range: 30.0 - 36.0 g/dL 34.1     RDW Latest Ref Range: 11.5 - 15.5 % 13.5     Platelets Latest Ref Range: 150 - 400 K/uL 283      Treatment Plan Summary: Treatment Plan/Recommendations:  Plan of Care: Drug dependencies and abuse/Anxiety Neurosis [place] CD IOP-see Counselor's individualized teatment program  Laboratory:  UDS per IOP protocol and insurance  Psychotherapy: CDIOP Group;Individual and Family  Medications: MAT Vivitrol;Propranolol;Seroquel-refill Lexapro  Routine PRN Medications:  No   Consultations: None at this time  Safety Concerns: Relapse/Precipitated Withdrawal  Other: ?Inpt detox based on continued Opiate use revealed after initial assesment   Darlyne Russian, PA-C 8/1/20184:21 PM   ADDENDUM: Pt had committed to self detox PTA but as it turns out he has not remained abstinent and keeps changing date of last use. This may explain his lack of Opiate withdrawal symptoms rather than the fact he is detoxed. Vivitrol is NOT RECOMMENDED until 7-10 days of abstinence are acheived.May need to consider inpt detox ?

## 2016-10-20 ENCOUNTER — Other Ambulatory Visit (HOSPITAL_COMMUNITY): Payer: Self-pay

## 2016-10-20 ENCOUNTER — Other Ambulatory Visit (HOSPITAL_COMMUNITY): Payer: BLUE CROSS/BLUE SHIELD | Admitting: Psychology

## 2016-10-20 DIAGNOSIS — F1320 Sedative, hypnotic or anxiolytic dependence, uncomplicated: Secondary | ICD-10-CM

## 2016-10-20 DIAGNOSIS — F1120 Opioid dependence, uncomplicated: Secondary | ICD-10-CM

## 2016-10-20 NOTE — Progress Notes (Signed)
    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 Members shared about their experience since last group and challenges and successes they had. Some UDS were collected from members. One new member was present and shared briefly about this experience w/ drug addiction and past tx. This new member met with Darlyne Russian to establish care. Counselors administered PHQ9 and GAD7 to all pts.     Group Time: 2:30-4  Participation Level: Active  Behavioral Response: Appropriate and Sharing  Type of Therapy: Psycho-education Group  Interventions: CBT, Supportive and Reframing  Topic: Patients were active and engaged in group psychoeducation session. Counselors led members in a lesson on core beliefs and cognitive distortion in early recovery. CBT strategies were taught and modeled to help w/ pts coping skills.   Summary: Patient was minimally active for his first group. He shared that he was feeling very down and sick due to currently detoxing off of opiates. He appeared tired, withdrawn, and distracted during group. He shared openly when asked about his goals for tx here. He stated he is "tired of being tired and stealing money for his addiction". Pt admitted he notices other addicted individuals at his place of employment and he does not want to end up "at rock bottom". He also reported his parents are threatening to kick him out of  the house if he does not get help. Pt talked w/ other pts openly and appeared interested in group experience despite his detoxing. Pt also met with Darlyne Russian to establish care.   UDS collected: Yes Results: pending  AA/NA attended?: No  Sponsor?: No   Wes Karmyn Lowman, LPCA LCASA 10/20/2016 9:49 AM

## 2016-10-24 ENCOUNTER — Other Ambulatory Visit (HOSPITAL_COMMUNITY): Payer: BLUE CROSS/BLUE SHIELD | Admitting: Psychology

## 2016-10-24 ENCOUNTER — Encounter (HOSPITAL_COMMUNITY): Payer: Self-pay | Admitting: Psychology

## 2016-10-24 DIAGNOSIS — F1120 Opioid dependence, uncomplicated: Secondary | ICD-10-CM | POA: Diagnosis not present

## 2016-10-24 DIAGNOSIS — F4010 Social phobia, unspecified: Secondary | ICD-10-CM

## 2016-10-24 DIAGNOSIS — F1320 Sedative, hypnotic or anxiolytic dependence, uncomplicated: Secondary | ICD-10-CM

## 2016-10-24 MED ORDER — ATOMOXETINE HCL 40 MG PO CAPS: ORAL_CAPSULE | ORAL | 1 refills | Status: DC

## 2016-10-24 MED ORDER — NALTREXONE HCL 50 MG PO TABS: 50.0000 mg | ORAL_TABLET | Freq: Every day | ORAL | 0 refills | Status: AC

## 2016-10-24 NOTE — Progress Notes (Signed)
    Daily Group Progress Note  Program: CD-IOP   10/24/2016 Daisy BlossomDavid B Rutt [id number]  Diagnosis:  No diagnosis found.   Sobriety Date: 10/19/16  Group Time: 1-2:30pm  Participation Level: Active  Behavioral Response: Sharing  Type of Therapy: Process Group  Interventions: Supportive  Topic: Process: The first part of group was spent in process. Members shared how they are doing and feeling in early recovery. They identified their 'shining moment' and 'speed bump'. At least two group members noted how differently they felt today as compared to Monday and the changing moods and outlooks in early recovery. One member appeared today after having missed yesterday's group session. He had returned to use and described the events that had led to his drug use. A drug test was collected from him.  Group Time: 2:30-4pm  Participation Level: Active  Behavioral Response: Appropriate and Sharing  Type of Therapy: Psycho-education Group  Interventions: CBT  Topic: Psycho-Ed: Popsicle Sticks. The second half of group included members reading the word or phrase on the popsicle stick they had picked earlier in the session. They are asked to describe what this means to them at this time in their new and sober life. There was a good discussion as group members shared their thoughts and provided feedback to each other. The final few minutes included members disclosing plans for the upcoming weekend and how they intended to work their program and return to group on Monday with the same sobriety date.  Summary: The patient was engaged and active in group and asked the group for feedback. He admitted he was worried because he is scheduled to attend a wedding this weekend. The patient denied that he 'could not go', but also agreed that it will be very hard because lots of old friends will be there. He asked the group what he might do? There was very good insight provided and helpful feedback from the  group. Someone wondered if he could connect with someone that would support his sobriety at the wedding and the patient concluded that perhaps his father could do that for him. When questioned further, he admitted his father would probably drink too much and he could be the designated driver. The patient reported that one of his biggest problems is boredom. He works at the car lot for many hours and sitting in the office with nothing to do is a source of boredom, a big trigger for him. There was considerable conversation among group members with this invitation for feedback and the member thanked his fellow group members for their concerns. In the psycho-ed, the patient's word was 'willingness'. He note that he 'must be willing to surrender, come to group, work the program and use the phone numbers I have been given'. This was good  to hear, but it remains to be seen whether he is willing or capable to do what he has identified as 'willingness'. The patient is quite candid and refreshingly honest in session. He responded well to this intervention.   UDS collected: No Results:   AA/NA attended?: No  Sponsor?: No   Charmian Muffnn Omar Orrego, LCAS 10/24/2016 10:53 AM

## 2016-10-26 ENCOUNTER — Other Ambulatory Visit (HOSPITAL_COMMUNITY): Payer: BLUE CROSS/BLUE SHIELD | Admitting: Psychology

## 2016-10-26 DIAGNOSIS — F1120 Opioid dependence, uncomplicated: Secondary | ICD-10-CM

## 2016-10-26 DIAGNOSIS — F411 Generalized anxiety disorder: Secondary | ICD-10-CM

## 2016-10-26 DIAGNOSIS — Z79899 Other long term (current) drug therapy: Secondary | ICD-10-CM

## 2016-10-26 NOTE — Progress Notes (Signed)
  Med check Pt started oral Naltrexone x 1 day without difficulty-will get injection next week. FU on [facility] in 2-3 weeks

## 2016-10-27 ENCOUNTER — Other Ambulatory Visit (HOSPITAL_COMMUNITY): Payer: BLUE CROSS/BLUE SHIELD | Admitting: Psychology

## 2016-10-27 ENCOUNTER — Encounter (HOSPITAL_COMMUNITY): Payer: Self-pay | Admitting: Psychology

## 2016-10-27 DIAGNOSIS — F1320 Sedative, hypnotic or anxiolytic dependence, uncomplicated: Secondary | ICD-10-CM

## 2016-10-27 DIAGNOSIS — F411 Generalized anxiety disorder: Secondary | ICD-10-CM

## 2016-10-27 DIAGNOSIS — F1120 Opioid dependence, uncomplicated: Secondary | ICD-10-CM

## 2016-10-27 NOTE — Progress Notes (Signed)
    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 Counselors led discussion on pts struggles and successes in early recovery. Counselors encouraged pts to discuss the coping skills utilized since last session. One new member was present and shared briefly but openly about her battle w/ weight, alcohol, and opiates. One member shared about a particularly tough relapse after attending a wedding that weekend. UDS were collected from some members. Some pts met Darlyne Russian for medication management and to establish care.      Group Time: 2:30-4  Participation Level: Active  Behavioral Response: Appropriate and Sharing  Type of Therapy: Psycho-education Group  Interventions: Other: Chair Yoga, Art Therapy  Topic: Patients were active and engaged for psychoeducation session. A guest counselor, Jan Fireman, led a 1hr chair yoga session and focused on using this skill for dealing w/ drug/alcohol cravings. Counselors then led a 1 hour art activity in which pts created paper mache masks that represent their external and internal feelings about themselves.    Summary: Patient was active and engaged in session. He reported he attended 1 AA meeting on Friday of last week. He reported he had a difficult weekend and first used alcohol at the wedding and then used opiates on 2 occassions. He stated he felt ashamed for relapsing but was happy to be at group today sharing. Pt admitted to being triggered by "everyone around him drinking, even my family" who  he believed would be supportive of his total sobriety. He stated his parents told him "alcohol was not a problem for him" which caused him to entertain the idea of drinking at the wedding. He then drank 10 beers and contacted old friends to get him opiates which he snorted. Pt presented as slowed and soft spoken. Pt was active in chair yoga. he participated actively in making his face mask. 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    UDS collected: No Results: negative  AA/NA attended?: YesFriday  Sponsor?: No   Youlanda Roys, LPCA LCASA 10/27/2016 4:34 PM

## 2016-10-28 ENCOUNTER — Encounter (HOSPITAL_COMMUNITY): Payer: Self-pay | Admitting: Psychology

## 2016-10-28 NOTE — Progress Notes (Signed)
    Daily Group Progress Note  Program: CD-IOP   10/28/2016 MOHSIN CRUM [id number]  Diagnosis:  Opioid use disorder, severe, dependence ([place])  Encounter for medication management  Anxiety neurosis   Sobriety Date: 8/6  Group Time: 1-2:30pm  Participation Level: Active  Behavioral Response: Sharing  Type of Therapy: Process Group  Interventions: Supportive  Topic: Process: The first half of group was spent in process. Members shared about the challenges and strengths that they experienced in early recovery. Two of the members who were absent on Monday returned and shared about their struggles. The medical director met with four members during this session to complete medication checks. The session was very intense with members sharing emotions that were deep and risked being vulnerable. Five random drug tests were collected today.  Group Time: 2:30-4pm  Participation Level: Active  Behavioral Response: Appropriate  Type of Therapy: Psycho-education Group  Interventions: Art Therapy  Topic: Psycho-ed: Masks; What You Hope to Show and What You're Really Feeling? The second half of group was spent in a psycho-ed. It was a follow-up to the session on Monday. Members were given white paper masks and asked to identify the impressions they tried to present to the world on the outside of the mask and the feelings they really experienced on the inside of their mask. It was a powerful session with evidence that group members expect more of themselves and are harder on themselves than on anyone else. The degree of vulnerability and disclosure provided today was to be applauded and suggested a degree of trust and closeness that is the goal of many group sessions.   Summary: The patient reported her had been struggling with 'bad cravings' this morning. He explained that he had overslept and his father had 'chewed me out'. The patient reported he had felt ashamed and angry and "I  wanted to get high". That was his speed bump. However, he noted that his shining moment was how 'welcomed I was back in the rooms". He had attended two 12-step meetings since our last group session and admitted he was very pleased at how  supportive and kind everyone had been towards his return. We discussed the emotions that are most associated with triggering his drug use and the patient identified those that are painful, including being embarrassed and feeling inadequate. In the psycho-ed, the patient provided feedback to a fellow patient and noted he could relate to those things she had identified about herself on the inside of her mask. The inside of his mask had a picture of a puppy dog and he explained that he often times felt 'confused and uncertain'. However, on the outside of his mask, the patient identified being a leader, being popular and the class clown. When questioned, the patient reported he identified as being a leader and worked hard to get others to acknowledge that. He wanted people to like him. He also emphasized his salesmanship and building trust .As the session came to a close, the counselor asked the patient how he was feeling now as compared to when he first arrived in group? The patient reported feeling much more relaxed and not angry any longer. He attributed this change in feelings to 'talking about it here with the group'. The patient made some helpful comments and responded well to this intervention.    UDS collected: Yes Results: pending  AA/NA attended?: [facility] and Tuesday  Sponsor?: No   Brandon Melnick, LCAS 10/28/2016 10:16 AM

## 2016-10-28 NOTE — Progress Notes (Signed)
    Daily Group Progress Note  Program: CD-IOP   10/28/2016 Nicholas Harrison [id number]  Diagnosis:  Opioid use disorder, severe, dependence ([facility])  Anxiety neurosis  Benzodiazepine dependence, episodic ([facility])   Sobriety Date: 8/6  Group Time: 1-2:30  Participation Level: Active  Behavioral Response: Appropriate, Sharing, Attention-Seeking and Drowsy  Type of Therapy: Process Group  Interventions: CBT and Motivational Interviewing  Topic: Patients were active and engaged in process session. Counselor led processing time in which pts discussed their struggles and successes in early recovery since last group. A UDS was collected from one group member who was absent last group. Pts also shared in a topical discussion related to AA/Recovery. Discussion was open and many shared deeply about their emotional triggers to use. One member admitted he had secretly called his dealer to plan a return to use but instead chose not to use based on the feedback he got from a group member.     Group Time: 2:30-4  Participation Level: Active  Behavioral Response: Appropriate, Sharing and Drowsy  Type of Therapy: Psycho-education Group  Interventions: CBT and Other: Relapse Prevention  Topic: Patients were active and engaged in psychoeducation session. Counselor led psychoeducation time in which the topic of Relapse Prevention was discussed. Counselor led pts through understanding the links in the chain of relapse: "Trigger-Thoughts-Cravings-Use". Pts verbalized understanding and were able to connect the lesson to their own personal stories of relapse and triggers.   Summary: Pt reported he had a good day w/ no speedbumps and was happy to report he woke up on time and arrived to work as scheduled. Pt admitted he was still not feeling "great physically" and continues to be withdrawing from opiates given his drowsy, slumped stated, and occasional confusion with his words. Pt reported he is  feeling more hopeful and taking his medications as px. He discussed his recent hx of doing opiates and smoking weed daily. Pt was engaged in relapse prevention discussion and appeared to gain insight.    UDS collected: No Results: pending  AA/NA attended?: No  Sponsor?: No   Wes Swan, LPCA LCASA 10/28/2016 10:23 AM

## 2016-10-31 ENCOUNTER — Other Ambulatory Visit (INDEPENDENT_AMBULATORY_CARE_PROVIDER_SITE_OTHER): Payer: BLUE CROSS/BLUE SHIELD | Admitting: Medical

## 2016-10-31 ENCOUNTER — Encounter: Payer: Self-pay | Admitting: Psychiatry

## 2016-10-31 ENCOUNTER — Encounter (HOSPITAL_COMMUNITY): Payer: Self-pay | Admitting: Licensed Clinical Social Worker

## 2016-10-31 DIAGNOSIS — F17210 Nicotine dependence, cigarettes, uncomplicated: Secondary | ICD-10-CM

## 2016-10-31 DIAGNOSIS — F1120 Opioid dependence, uncomplicated: Secondary | ICD-10-CM | POA: Diagnosis not present

## 2016-10-31 DIAGNOSIS — F411 Generalized anxiety disorder: Secondary | ICD-10-CM | POA: Diagnosis not present

## 2016-10-31 DIAGNOSIS — R5381 Other malaise: Secondary | ICD-10-CM | POA: Diagnosis not present

## 2016-10-31 DIAGNOSIS — R5383 Other fatigue: Secondary | ICD-10-CM | POA: Diagnosis not present

## 2016-10-31 DIAGNOSIS — Z818 Family history of other mental and behavioral disorders: Secondary | ICD-10-CM | POA: Diagnosis not present

## 2016-10-31 DIAGNOSIS — Z9114 Patient's other noncompliance with medication regimen: Secondary | ICD-10-CM | POA: Diagnosis not present

## 2016-10-31 DIAGNOSIS — F1320 Sedative, hypnotic or anxiolytic dependence, uncomplicated: Secondary | ICD-10-CM | POA: Diagnosis not present

## 2016-10-31 DIAGNOSIS — Z9111 Patient's noncompliance with dietary regimen: Secondary | ICD-10-CM | POA: Diagnosis not present

## 2016-10-31 DIAGNOSIS — F19922 Other psychoactive substance use, unspecified with intoxication with perceptual disturbance: Secondary | ICD-10-CM

## 2016-11-01 ENCOUNTER — Encounter (HOSPITAL_COMMUNITY): Payer: Self-pay | Admitting: Licensed Clinical Social Worker

## 2016-11-01 ENCOUNTER — Encounter (HOSPITAL_COMMUNITY): Payer: Self-pay | Admitting: Psychology

## 2016-11-01 NOTE — Progress Notes (Signed)
CDIOP TREATMENT PLANNING INDIVIDUAL SESSION  THERAPIST PROGRESS NOTE  Session Time: 4-4:45pm  Participation Level: Active  Behavioral Response: Casual and Well GroomedAlertEuthymic  Type of Therapy: Individual Therapy  Treatment Goals addressed: Anxiety and Diagnosis: Opiate Use Disorder  Interventions: CBT and Motivational Interviewing  Summary: Nicholas BlossomDavid B Harrison is a 26 y.o. male who presents with Opiate Use Disorder and hx of social anxiety. Pt and counselor discuss treatment plan w/ 3 goals: 1. Establish and maintain sobriety 2. Build social support 3. Gain organizational skills and decrease procrastination at work. Pt appears distracted in session and answers a telephone call. 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 Pt and counselor sign tx documentation.   Suicidal/Homicidal: Nowithout intent/plan  Therapist Response: Pt appears distracted and wanting to leave session due to work requirements. Pt has no hx of past tx or counseling and may need more structure provided by counseling to benefit from individual sessions.  Plan: Return again in 1 weeks.  Diagnosis:    ICD-10-CM   1. Opioid use disorder, severe, dependence (HCC) F11.20   2. Benzodiazepine dependence, episodic (H[facility]) F13.20   3. 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

## 2016-11-01 NOTE — Progress Notes (Signed)
    Daily Group Progress Note  Program: CD-IOP   11/01/2016 KEHINDE BOWDISH [id number]  Diagnosis:  No diagnosis found.   Sobriety Date: 8/6  Group Time: 1-2:30pm  Participation Level: Active  Behavioral Response: Sharing  Type of Therapy: Process Group  Interventions: Supportive  Topic: Process: the first half of group was spent in process. Members shared about the past weekend and any struggles or challenges they had faced. Group members identified their 'shining moment' and any 'speed bumps'. The medical director met with two group members for med checks and one member for her discharge. Four random drug tests were collected.  Group Time: 2:30-4pm  Participation Level: Active  Behavioral Response: Sharing  Type of Therapy: Psycho-education Group/Graduation  Interventions: Strength-based  Topic: Psycho-Ed: Grounding Script/Internal Triggers/Graduation. The second half of group was spent in a psycho-ed. the counselor read a 'Grounding Script" and members provided feedback upon the conclusion of the reading. A psycho-ed on 'Internal Triggers' was introduced. A handout was provided on identifying internal triggers. Members shared their findings and most of the group identified negative feelings or experiences as triggers. This psycho-ed concluded as the session neared an end. A graduation ceremony was held, and the group members' mother attended. Kind words were shared with the graduating member and she tearfully thanked her fellow group members for their support and encouragement throughout her treatment experience.    Summary: The patient reported his shining moment was that he sold four cars over the weekend. He also went on a blind date. "It was really good", the patient reported. They had gone out with another couple and had dinner. Then they saw the new 'Spike Truman Hayward' movie. When asked by a fellow group member whether he had told the date he was in recovery, the patient  admitted he had not and would wait for a while on disclosing that information to her. The patient's speed bump was being yelled at by his dad this morning. He explained he had gone to the gas station over the weekend but had forgotten his wallet. He assured the owner he would return with the money. "But I totally forgot", the patient explained, and the owner had phoned his father. "My dad yelled at me and called me a pill head". It was hurtful. It was noted that the patient seemed very distracted. The patient apologized and admitted he was very 'stressed out' and he was thinking about many things. He reported after the grounding exercise, "I am all over the place" and could not really focus on the script. When asked about his internal triggers, the patient reported his biggest trigger seems to be 'criticism'. Any time he feels criticized, "it takes me back to my father's criticism". The patient had kind words to share with the graduating member and wished her well. During group today, the patient met with the program dire tor. He had wanted to discuss whether he should stick with the daily medication for cravings, naltrexone, or go ahead and get the monthly injection. Despite being distracted, the patient provided helpful feedback and remained drug-free over the weekend. A drug test was collected from this patient.    UDS collected: Yes Results: pending  AA/NA attended?: YesThursday and Saturday  Sponsor?: No   Brandon Melnick, LCAS 11/01/2016 9:00 AM

## 2016-11-02 ENCOUNTER — Other Ambulatory Visit (HOSPITAL_COMMUNITY): Payer: BLUE CROSS/BLUE SHIELD | Admitting: Psychology

## 2016-11-02 DIAGNOSIS — F1120 Opioid dependence, uncomplicated: Secondary | ICD-10-CM | POA: Diagnosis not present

## 2016-11-02 DIAGNOSIS — F1320 Sedative, hypnotic or anxiolytic dependence, uncomplicated: Secondary | ICD-10-CM

## 2016-11-02 DIAGNOSIS — F411 Generalized anxiety disorder: Secondary | ICD-10-CM

## 2016-11-03 ENCOUNTER — Other Ambulatory Visit (HOSPITAL_COMMUNITY): Payer: BLUE CROSS/BLUE SHIELD | Admitting: Psychology

## 2016-11-03 ENCOUNTER — Telehealth (HOSPITAL_COMMUNITY): Payer: Self-pay | Admitting: Psychology

## 2016-11-03 ENCOUNTER — Encounter (HOSPITAL_COMMUNITY): Payer: Self-pay | Admitting: Psychology

## 2016-11-03 NOTE — Progress Notes (Signed)
    Daily Group Progress Note  Program: CD-IOP   11/03/2016 Nicholas Harrison [id number]  Diagnosis:  Opioid use disorder, severe, dependence ([place])  Benzodiazepine dependence, episodic ([place])  Anxiety neurosis   Sobriety Date: 8/6  Group Time: 1-2:30  Participation Level: Active  Behavioral Response: Appropriate and Sharing  Type of Therapy: Process Group  Interventions: CBT, Motivational Interviewing, Strength-based and Supportive  Topic: Patients were active and engaged in process session today. They were encouraged to share about their successes and struggles in early recovery. Counselors helped pts identify specific skills and tools they have learned in [place] to handle triggers and cravings to use. Pts discussed their attendance in self-help groups and their sobriety. One new member was present today and shared openly. Some UDS were collected from members and some results from previous tests were returned. Some pts met w/ program director Darlyne Russian for medication management and f/u.     Group Time: 2:30-4  Participation Level: Active  Behavioral Response: Appropriate and Sharing  Type of Therapy: Psycho-education Group  Interventions: CBT and Other: Spirituality  Topic: Patients were active and engaged in psychoeducation session today. Pts were led by counselors and a Psychologist, forensic Messenger who led an experiential exercise in "being honest". Pts were invited to go around the room 1 by 1 and share a way that they "pretend" in group to make situations appear more positive than they actually are. Pts shared openly and appeared to grow in their trust and comfort w/ each other and w/ group facilitators.    Summary: Patient was active and engaged in group today. He reported attending no AA or NA since Monday. He reported on going on a date w/ a new girl friend and states they are "just hanging out", though he admitted he has strong feelings for her. Pt was asked  whether he has weighed the pros and cons of entering a relationship while in such early sobriety. Pt agreed but minimized the impact the relationship could have on him. Pt stated he is dealing w/ anger towards the girlfriend's exboyfriends who is a minor and "jealous" of pt. Pt was called a "drug addict" by ex boyfriend which made pt very angry and want to punch him. Pt was active in engaging the Chaplain and asked numerous questions about getting "less anger". Negative UDS results were returned to pt and pt stated he was happy to be able to show his parents his progress. He admitted it was the first UDS he has passed in 3 or 4 years.   UDS collected: No Results: negative  AA/NA attended?: No  Sponsor?: No   Wes Ragan Duhon, LPCA LCASA 11/03/2016 11:25 AM

## 2016-11-07 ENCOUNTER — Encounter (HOSPITAL_COMMUNITY): Payer: Self-pay | Admitting: Licensed Clinical Social Worker

## 2016-11-07 ENCOUNTER — Other Ambulatory Visit (HOSPITAL_COMMUNITY): Payer: BLUE CROSS/BLUE SHIELD | Admitting: Psychology

## 2016-11-07 DIAGNOSIS — F411 Generalized anxiety disorder: Secondary | ICD-10-CM

## 2016-11-07 DIAGNOSIS — F1120 Opioid dependence, uncomplicated: Secondary | ICD-10-CM

## 2016-11-07 DIAGNOSIS — F1320 Sedative, hypnotic or anxiolytic dependence, uncomplicated: Secondary | ICD-10-CM

## 2016-11-08 ENCOUNTER — Encounter (HOSPITAL_COMMUNITY): Payer: Self-pay | Admitting: Licensed Clinical Social Worker

## 2016-11-08 NOTE — Progress Notes (Signed)
   THERAPIST PROGRESS NOTE  Session Time: 4-5pm  Participation Level: Active  Behavioral Response: Fairly Groomed and NeatAlertAnxious and Euthymic  Type of Therapy: Individual Therapy  Treatment Goals addressed: Coping and Diagnosis: SUD  Interventions: CBT, Strength-based and Supportive  Summary: Nicholas Harrison is a 27 y.o. male who presents for weekly individual therapy as part of CDIOP. He reports he has had a busy weekend with many stressors. He reports he is happy about "making his relationship w/ a new girlfriend official". He states she is supportive of him and "nice for him to talk to". Pt reports his family continues to stress him out. He admits he has not been taking the Naltrexone for cravings as px and states he "wants to get off pills w/o using other pills". Pt admitted he felt there was a "certain weakness attached to needing medication". Counselor spent time discussing this bias and resolving misinformation. Pt agreed he would consider craving medication if he relapsed again or "came close to using". Pt became very agitated in session and began discussing his discomfort and desire to "leave now". Counselor encouraged pt to stay w/ his feelings and allow them to "be as they are" w/ counselor in the room. Pt agreed and said he felt like he was having a "mini panic attack". Counselor spent time supporting pt in session and encouraging his bravery and normalizing panic attacks as a symptom of anxiety.  Pt left session feeling somewhat better but admitted he was "ready to get some alone time" and is having trouble focusing for such a long time.   Suicidal/Homicidal: Nowithout intent/plan  Therapist Response: It is unclear whether pt is more uncomfortable w/ 1 on 1 or he is anxious to return to work since both of these reasons were mentioned as contributing to his "mini panic attack". Pt is new to counseling and needs more time to get comfortable w/ counseling process.   Plan: Return  again in 1 weeks.  Diagnosis:    ICD-10-CM   1. Opioid use disorder, severe, dependence (HCC) F11.20   2. Benzodiazepine dependence, episodic (HCC) F13.20   3. 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

## 2016-11-09 ENCOUNTER — Other Ambulatory Visit (INDEPENDENT_AMBULATORY_CARE_PROVIDER_SITE_OTHER): Payer: BLUE CROSS/BLUE SHIELD | Admitting: Medical

## 2016-11-09 ENCOUNTER — Encounter (HOSPITAL_COMMUNITY): Payer: Self-pay | Admitting: Psychology

## 2016-11-09 ENCOUNTER — Encounter (HOSPITAL_COMMUNITY): Payer: Self-pay

## 2016-11-09 VITALS — BP 126/74 | HR 61 | Ht 71.0 in | Wt 199.0 lb

## 2016-11-09 DIAGNOSIS — Z9111 Patient's noncompliance with dietary regimen: Secondary | ICD-10-CM

## 2016-11-09 DIAGNOSIS — F411 Generalized anxiety disorder: Secondary | ICD-10-CM | POA: Diagnosis not present

## 2016-11-09 DIAGNOSIS — Z9114 Patient's other noncompliance with medication regimen: Secondary | ICD-10-CM

## 2016-11-09 DIAGNOSIS — F1120 Opioid dependence, uncomplicated: Secondary | ICD-10-CM | POA: Diagnosis not present

## 2016-11-09 DIAGNOSIS — F19922 Other psychoactive substance use, unspecified with intoxication with perceptual disturbance: Secondary | ICD-10-CM

## 2016-11-09 DIAGNOSIS — F1320 Sedative, hypnotic or anxiolytic dependence, uncomplicated: Secondary | ICD-10-CM | POA: Diagnosis not present

## 2016-11-09 NOTE — Addendum Note (Signed)
Addended by: Court Joy on: 11/09/2016 04:11 PM   Modules accepted: Level of Service

## 2016-11-09 NOTE — Progress Notes (Signed)
Patient ID: Nicholas Harrison, male   DOB: [birth year], 26 y.o.   MRN: [id number] S-Pt seen to FU on Vivitrol injection.Now says he has changed his mind and is taking oral Naltrexone under supervision of parents."They make sure I take my medications"  ROS CONSTITUTIONAL:  NO Fever, Chills, Loss of Sleep, Fatigue, Generalized Weakness and Poor Appetite  EYES:NO Change in Vision, Double Vision, Blurred Vision and Tearing  EARS/NOSE/THROAT: NO Hearing Loss, Ear Infections, Ear Drainage, Ringing in Ears, Ear Pain, Runny Nose, Nose Bleeding and Dental Problems  HEART: NO Palpitations and Chest Pain  LUNG: NO Wheezing, Shortness of Breath  and Coughing    STOMACH/BOWEL: NO Nausea, Vomiting, Heartburn, Reflux, Change in stool habits, Diarrhea  , Constipation, Change in Color Stool  and Abdominal Pain  GENITOURINARY:NO Incontinence, Pain with Urination, Frequency, Urgency, Urinary Tract Infections and Blood in Urine  SKIN: NORash, Dryness, Hyperpigmentation and Pruritus  MUSCLE/BONES: NO Back Pain, Joint Pain and Difficulties Walking  NERVOUS SYSTEM:NO Seizure, Headaches, Dizziness, Vertigo, Blurred Vision, Paralysis  , Weakness and Numbness  HORMONES/REPRODUCTIVE:NO Diabetes and Thyroid Problem  BLOOD/LYMPH SYSTEM:NO Easy bruising/bleeding;Swollen glands  IMMUNE: NO Steroid Use and Immune Disorder  Psychiatric: Agitation: Negative Hallucination: Negative Depressed Mood: Yes Insomnia: Yes  Anxiety: CC Hypersomnia: No Altered Concentration:  Yes ?ADHD-never tested claims problem aschild-never did well in school Feels Worthless: Level 1 on PHQ 9 Grandiose Ideas: Negative Belief In Special Powers: Negative New/Increased Substance Abuse: No Compulsions: Addictions   Medication List  as of 10/31/16 11:59 PM  atomoxetine 40 MG capsule  Commonly known as: STRATTERA  Take 1 cap daily for 7 days then increase to 2 caps daily   escitalopram 20 MG tablet  Commonly known as: LEXAPRO  Take 1  tablet (20 mg total) by mouth daily. Take in PM   ibuprofen 800 MG tablet  Commonly known as: ADVIL,MOTRIN  Take 1 tablet (800 mg total) by mouth every 8 (eight) hours as needed.   Naltrexone 380 MG Susr  Commonly known as: VIVITROL  Inject 380 mg into the muscle every 30 (thirty) days.   naltrexone 50 MG tablet  Commonly known as: DEPADE  Take 1 tablet (50 mg total) by mouth daily.   propranolol 10 MG tablet  Commonly known as: INDERAL  Take 1 tablet (10 mg total) by mouth 3 (three) times daily.   QUEtiapine 25 MG tablet  Commonly known as: SEROQUEL  Take 1-2 tabs at bedtime    O- Mental Status Examination    Appearance: Casually dressed Alert: Yes Attention: fair  Cooperative: Yes Eye Contact: Fair Speech: normal in volume, rate, tone, spontaneous Psychomotor Activity: Normal Memory/Concentration: OK Oriented: person, place and situation Mood: Euthymic Affect: Congruent and Full Range Thought Processes and Associations: Goal Directed and Intact Fund of Knowledge: Fair Thought Content: Suicidal ideation, Homicidal ideation, Auditory hallucinations, Visual hallucinations, Delusions and Paranoia, None reported Insight: Fair to poor Judgement: Fair to poor Language: Fair  Musculoskeletal: Strength & Muscle Tone: within normal limits Gait & Station: normal Patient leans: N/A  A- Opiate Dependence MAT Naltrexone (oral) in treatment (CDIOP)       Benzodiazapene Dep in treatment (CDIOP)       ? ADD/ADHD        Anxiety neurosis P-Continue CDIOP and meds as ordered      Trial of Straterra

## 2016-11-09 NOTE — Progress Notes (Signed)
    Daily Group Progress Note  Program: CD-IOP   11/09/2016 Nicholas Harrison [id number]  Diagnosis:  Opioid use disorder, severe, dependence ([place])  Benzodiazepine dependence, episodic ([place])  Anxiety neurosis   Sobriety Date: 8/6  Group Time: 1-2:30  Participation Level: Active  Behavioral Response: Appropriate and Sharing  Type of Therapy: Process Group  Interventions: CBT and Supportive  Topic: Patients were active and engaged for process session. They shared openly about their weekends, challenges and successes in their journeys of recovery. Pts were asked to highlight skills and techniques they have learned to utilize outside of the therapy room. Some patients met with Darlyne Russian, Medical director to discuss medications and gene sight test results.      Group Time: 2:30-4  Participation Level: Active  Behavioral Response: Appropriate and Sharing  Type of Therapy: Psycho-education Group  Interventions: Psychosocial Skills: Increasing emotional vocabulary  Topic: Patients were active and engaged for psychoeducation session. Pts were given a handout on "feelings vs. thoughts" and asked to discuss its talking points. Pts were then led in a experiential game of "Feelings Jenga" in which pts cooperatively discuss different feeling states in recovery and in active addiction. Some UDS results were return and members were asked to explain some inconsistent results, which they did.   Summary: Pt was active and engaged in session. He reported attended 3 AA meetings in [place] this weekend. 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 Pt was upset and admitted he felt "like an idiot bc when I try to help anyone they take advantage of me". Another group member quickly pointed out this was "cognitive distortion" of "all or nothing thinking". Pt agreed but admitted he was frustrated and did not  know how to handle it. He stated he did not think about using over this event. Pt reported he has solidified his relationship w/ his new "girlfriend" who is supportive of his recovery. Pt was reminded of the recommendation to avoid entering intimate relationships in first year of recovery. Pt listened and participated actively in emotions work in second half of group.    UDS collected: Yes Results: negative  AA/NA attended?: YesFriday and Saturday  Sponsor?: No  WYoulanda Roys LPCA LCASA 11/09/2016 9:36 AM
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
## 2016-11-10 ENCOUNTER — Other Ambulatory Visit (HOSPITAL_COMMUNITY): Payer: BLUE CROSS/BLUE SHIELD | Admitting: Psychology

## 2016-11-10 ENCOUNTER — Telehealth (HOSPITAL_COMMUNITY): Payer: Self-pay | Admitting: Licensed Clinical Social Worker

## 2016-11-10 DIAGNOSIS — F1320 Sedative, hypnotic or anxiolytic dependence, uncomplicated: Secondary | ICD-10-CM

## 2016-11-10 DIAGNOSIS — F1120 Opioid dependence, uncomplicated: Secondary | ICD-10-CM | POA: Diagnosis not present

## 2016-11-10 NOTE — Progress Notes (Signed)
Nicholas Harrison is a 26 y.o. male patient. CD-IOP: Impaired patient. The patient arrived to group late today. He sat down and it was quiet evident that he was not himself. It took very little time to determine that the patient was impaired in some way. This counselor confronted the patient and expressed concern about his well-being. The patient denied having used anything, but admitted he hadn't slept and was really tired. The patient was escorted to the nursing station where the nurse evaluated the patient. It was determined that he would stay here at the clinic for the time being and he surrendered his car keys to her. After sleeping, eating some food provided to him and waiting for almost four hours, the patient allowed to go home. The nurse evaluated the patient and he appeared more collected and sober. His vitals were taken - BP 126/74, pulse 61. His eyes were responsive and appropriate to light. He later phoned upon arriving home and left a message expressing his regrets for having used as well as his appreciation for our concerns, stating, "I love you guys". The patient reported he would return to group tomorrow. This is his second relapse since entering the program. A behavioral contract will be addressed and a session with his parents is being scheduled for later this week. We will continue to follow closely in the days ahead.       Charmian Muff, LCAS

## 2016-11-10 NOTE — Telephone Encounter (Signed)
Discussed meeting w/ pt's mother and father as part of a family session for CDIOP. Mother agreed and apportionment was made for Monday 12/15/16 at 4pm.

## 2016-11-11 ENCOUNTER — Encounter (HOSPITAL_COMMUNITY): Payer: Self-pay | Admitting: Psychology

## 2016-11-11 NOTE — Progress Notes (Signed)
    Daily Group Progress Note  Program: CD-IOP   11/11/2016 Nicholas Harrison [id number]  Diagnosis:  Opioid use disorder, severe, dependence (HCC)  Benzodiazepine dependence, episodic (HCC)   Sobriety Date: 8/23  Group Time: 1-2:30  Participation Level: Minimal  Behavioral Response: Blaming, Drowsy, Agitated and Minimizing  Type of Therapy: Process Group  Interventions: CBT and Motivational Interviewing  Topic: Patients were active and engaged for group process session. Counselors used open questions and reflection to help pts share about their challenges and successes in early recovery. Pts shared about their tx goals of sustaining sobriety and growing social support for recovery. One pt shared about his relapse yesterday and apologized to group for attending high and lying about his sobriety. Pts shared openly in a topical discussion that centered on differing recovery and 12 Step topics.      Group Time: 2:30-4  Participation Level: Minimal  Behavioral Response: Rationalizing, Resistant, Blaming, Drowsy and Minimizing  Type of Therapy: Psycho-education Group  Interventions: CBT and Psychosocial Skills: Shame and doubt  Topic: Patients were active and engaged for group psychoeducation session. Counselors led a psychoeducation lesson on "healing from shame" utilizing a video from [employer] and group discussion. Pts were reflective and thoughtful in their discussion and were able to vocalize significant understanding of the power shame plays in active addiction.    Summary: Pt appeared tired and disengaged in group. He reported he was "sorry for coming high yesterday". His verbalizations were muffled and limited compared to previous sessions. 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 Counselor confronted him w/ the reality that his loved one's believe he will not have a  dealership to worry about if he does not do something about his addiction. Pt agreed but did not make strong eye contact w/ anyone in group. He slouched in his chair for most of group but did appear to stay awake. Towards end of session when asked how he felt, he reported he felt "anxious to leave, and that he wanted to miss [employer]".   UDS collected: No Results: Self reported positive for benzodiazepines and narcotics  AA/NA attended?: No  Sponsor?: No   Dorann Lodge, LPCA LCASA 11/11/2016 12:36 PM

## 2016-11-14 ENCOUNTER — Other Ambulatory Visit (HOSPITAL_COMMUNITY): Payer: BLUE CROSS/BLUE SHIELD | Admitting: Psychology

## 2016-11-14 ENCOUNTER — Telehealth (HOSPITAL_COMMUNITY): Payer: Self-pay | Admitting: Psychology

## 2016-11-14 DIAGNOSIS — F1120 Opioid dependence, uncomplicated: Secondary | ICD-10-CM | POA: Diagnosis not present

## 2016-11-14 DIAGNOSIS — F1320 Sedative, hypnotic or anxiolytic dependence, uncomplicated: Secondary | ICD-10-CM

## 2016-11-15 ENCOUNTER — Telehealth (HOSPITAL_COMMUNITY): Payer: Self-pay | Admitting: Licensed Clinical Social Worker

## 2016-11-15 ENCOUNTER — Encounter (HOSPITAL_COMMUNITY): Payer: Self-pay | Admitting: Licensed Clinical Social Worker

## 2016-11-15 ENCOUNTER — Encounter (HOSPITAL_COMMUNITY): Payer: Self-pay | Admitting: Psychology

## 2016-11-15 DIAGNOSIS — F1120 Opioid dependence, uncomplicated: Secondary | ICD-10-CM

## 2016-11-15 DIAGNOSIS — F1320 Sedative, hypnotic or anxiolytic dependence, uncomplicated: Secondary | ICD-10-CM

## 2016-11-15 DIAGNOSIS — F411 Generalized anxiety disorder: Secondary | ICD-10-CM

## 2016-11-15 NOTE — Telephone Encounter (Signed)
Phoned pt w/ no answer and no ability to leave VM. Then phoned mother and asked her to contact pt and let him know he needs to attend individual therapy as part of CDIOP today at 4pm w/ this Clinical research associate. Mother agreed to contact him and assured this Clinical research associate he would be in attendance.
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
## 2016-11-15 NOTE — Progress Notes (Signed)
CDIOP INDIVIDUAL SESSION  THERAPIST PROGRESS NOTE  Session Time: 4-5pm  Participation Level: Active  Behavioral Response: Casual and NeatAlertEuthymic  Type of Therapy: Individual Therapy  Treatment Goals addressed: Anxiety and Diagnosis: SUD  Interventions: Motivational Interviewing and Supportive  Summary: Nicholas Harrison is a 26 y.o. male who presents for weekly individual therapy as part of [place]. Pt is active and engaged in session and presents as coherent but admits he is "sweating from withdrawals from opiates and benzos". Counselor spends time helping pt process how the recent family session went. Counselor met w/ pt's parents yesterday and spent time in today's session asking how his parents are treating him since that meeting. Pt stated his mother is supportive and his dad says that he needs to go to inpatient rehab.   Counselor spends time discussing how Outpatient level of care is not meeting pt's needs and that pt's safety cannot be reasonably guaranteed. Pt admits this is correct and he might be tempted to use in the coming days. Counselor encouraged pt to weigh pros and cons of entering inpatient rehab for the second time in 1 year. Pt agrees but shows hesitation since he "wishes he was a one chip wonder".   Counselor encouraged pt to go home, speak w/ his mother about options he has to enter a higher level of care and attend CDIOP tomorrow for his last group. PT agreed and stated he feels "hopeful and relieved" after today's session.   Suicidal/Homicidal: Nowithout intent/plan  Therapist Response: Counselor used MI to help build pt motivation to change. Counselor used spirituality and pt's christian religion to help frame some of the "messages" pt is receiving about what he needs to do. Counselor used supportive techniques to reframe pt's stuckness as "emotional stress and pain" that he needs to be removed from in order to get better.   Plan: Return again for CDIOP tomorrow  11/16/16.  Diagnosis:    ICD-10-CM   1. Opioid use disorder, severe, dependence ([place]) F11.20   2. Benzodiazepine dependence, episodic ([facility]) F13.20   3. 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

## 2016-11-16 ENCOUNTER — Other Ambulatory Visit (HOSPITAL_BASED_OUTPATIENT_CLINIC_OR_DEPARTMENT_OTHER): Payer: BLUE CROSS/BLUE SHIELD | Admitting: Medical

## 2016-11-16 ENCOUNTER — Encounter (HOSPITAL_COMMUNITY): Payer: Self-pay | Admitting: Medical

## 2016-11-16 DIAGNOSIS — F1120 Opioid dependence, uncomplicated: Secondary | ICD-10-CM

## 2016-11-16 DIAGNOSIS — F1320 Sedative, hypnotic or anxiolytic dependence, uncomplicated: Secondary | ICD-10-CM | POA: Diagnosis not present

## 2016-11-16 DIAGNOSIS — Z9114 Patient's other noncompliance with medication regimen: Secondary | ICD-10-CM

## 2016-11-16 DIAGNOSIS — Z9111 Patient's noncompliance with dietary regimen: Secondary | ICD-10-CM | POA: Diagnosis not present

## 2016-11-16 NOTE — Progress Notes (Signed)
  [facility]   Nicholas BlossomDavid B Harrison [id number]  Date of Admission: 10/19/2016 Date of Discharge: 11/16/2016  Course of Treatment:UNSUCCESSFUL despite 3 seperate interventions and family session during treatment Pt was unable to abstain from opiates and refused to take Naltrexone which he lied about taking. He submitted 2 urines for UDS that were not his urine. He arrived at group intoxicated on one occasion He subsequently admitted he came under pressure from his father and mother. His father admits he "was" an alcoholic but stopped on his own and now claim he can drink "4 or 4 beers and be "okay"-He expressed in Family meeting his son needed to do the same.Mother attends Nicholas Harrison and tries to help son in appropriate ways.  Goals and Activities to Help Maintain Sobriety: 1. Stay away from triggers/old playmates and playgrounds with  use mind-altering chemicals. 2. Continue practicing Fair Fighting rules in interpersonal conflicts. 3. Continue alcohol and drug refusal skills and call on support systems. 4. Seek higher level care-possibly long term (3-12 MONTHS)  Referrals: RESIDENTIAL TREATMENT OF CHOICE   Aftercare services:NA 1. Attend AA/NA meetings ATt least once daily 2. Obtain a sponsor and a home group in AA/NA. 3. Return inpt treatment  Next appointment: NA  Plan of Action to Address Continuing Problems: Above    Client has participated in the development of this discharge plan and has received a copy of this completed plan  Maryjean MornCharles Sandrine Harrison  11/16/2016   [name], PA-C 11/16/2016

## 2016-11-17 ENCOUNTER — Other Ambulatory Visit (HOSPITAL_COMMUNITY): Payer: BLUE CROSS/BLUE SHIELD

## 2016-11-23 ENCOUNTER — Other Ambulatory Visit (HOSPITAL_COMMUNITY): Payer: BLUE CROSS/BLUE SHIELD

## 2016-11-24 ENCOUNTER — Other Ambulatory Visit (HOSPITAL_COMMUNITY): Payer: BLUE CROSS/BLUE SHIELD

## 2016-11-28 ENCOUNTER — Other Ambulatory Visit (HOSPITAL_COMMUNITY): Payer: BLUE CROSS/BLUE SHIELD

## 2016-11-30 ENCOUNTER — Other Ambulatory Visit (HOSPITAL_COMMUNITY): Payer: BLUE CROSS/BLUE SHIELD

## 2016-12-01 ENCOUNTER — Other Ambulatory Visit (HOSPITAL_COMMUNITY): Payer: BLUE CROSS/BLUE SHIELD

## 2016-12-05 ENCOUNTER — Other Ambulatory Visit (HOSPITAL_COMMUNITY): Payer: BLUE CROSS/BLUE SHIELD

## 2016-12-07 ENCOUNTER — Other Ambulatory Visit (HOSPITAL_COMMUNITY): Payer: BLUE CROSS/BLUE SHIELD

## 2016-12-08 ENCOUNTER — Other Ambulatory Visit (HOSPITAL_COMMUNITY): Payer: BLUE CROSS/BLUE SHIELD

## 2016-12-12 ENCOUNTER — Other Ambulatory Visit (HOSPITAL_COMMUNITY): Payer: BLUE CROSS/BLUE SHIELD

## 2016-12-14 ENCOUNTER — Other Ambulatory Visit (HOSPITAL_COMMUNITY): Payer: BLUE CROSS/BLUE SHIELD

## 2016-12-15 ENCOUNTER — Other Ambulatory Visit (HOSPITAL_COMMUNITY): Payer: BLUE CROSS/BLUE SHIELD

## 2016-12-19 ENCOUNTER — Other Ambulatory Visit (HOSPITAL_COMMUNITY): Payer: BLUE CROSS/BLUE SHIELD

## 2016-12-21 ENCOUNTER — Other Ambulatory Visit (HOSPITAL_COMMUNITY): Payer: BLUE CROSS/BLUE SHIELD

## 2016-12-22 ENCOUNTER — Other Ambulatory Visit (HOSPITAL_COMMUNITY): Payer: BLUE CROSS/BLUE SHIELD

## 2016-12-26 ENCOUNTER — Other Ambulatory Visit (HOSPITAL_COMMUNITY): Payer: BLUE CROSS/BLUE SHIELD

## 2016-12-28 ENCOUNTER — Other Ambulatory Visit (HOSPITAL_COMMUNITY): Payer: BLUE CROSS/BLUE SHIELD

## 2016-12-29 ENCOUNTER — Other Ambulatory Visit (HOSPITAL_COMMUNITY): Payer: BLUE CROSS/BLUE SHIELD

## 2017-01-02 ENCOUNTER — Other Ambulatory Visit (HOSPITAL_COMMUNITY): Payer: BLUE CROSS/BLUE SHIELD

## 2017-01-04 ENCOUNTER — Other Ambulatory Visit (HOSPITAL_COMMUNITY): Payer: BLUE CROSS/BLUE SHIELD

## 2017-01-05 ENCOUNTER — Other Ambulatory Visit (HOSPITAL_COMMUNITY): Payer: BLUE CROSS/BLUE SHIELD

## 2017-01-09 ENCOUNTER — Other Ambulatory Visit (HOSPITAL_COMMUNITY): Payer: BLUE CROSS/BLUE SHIELD

## 2017-01-11 ENCOUNTER — Other Ambulatory Visit (HOSPITAL_COMMUNITY): Payer: BLUE CROSS/BLUE SHIELD

## 2017-01-12 ENCOUNTER — Other Ambulatory Visit (HOSPITAL_COMMUNITY): Payer: BLUE CROSS/BLUE SHIELD

## 2017-01-16 ENCOUNTER — Other Ambulatory Visit (HOSPITAL_COMMUNITY): Payer: BLUE CROSS/BLUE SHIELD

## 2017-01-18 ENCOUNTER — Other Ambulatory Visit (HOSPITAL_COMMUNITY): Payer: BLUE CROSS/BLUE SHIELD

## 2017-06-14 DIAGNOSIS — F411 Generalized anxiety disorder: Secondary | ICD-10-CM | POA: Diagnosis not present

## 2017-06-15 ENCOUNTER — Emergency Department (HOSPITAL_COMMUNITY): Payer: BLUE CROSS/BLUE SHIELD

## 2017-06-15 ENCOUNTER — Emergency Department (HOSPITAL_COMMUNITY)
Admission: EM | Admit: 2017-06-15 | Discharge: 2017-06-15 | Disposition: A | Discharge status: Home / Self Care | Payer: BLUE CROSS/BLUE SHIELD | Attending: Emergency Medicine | Admitting: Emergency Medicine

## 2017-06-15 ENCOUNTER — Other Ambulatory Visit: Payer: Self-pay

## 2017-06-15 ENCOUNTER — Encounter (HOSPITAL_COMMUNITY): Payer: Self-pay

## 2017-06-15 DIAGNOSIS — R569 Unspecified convulsions: Secondary | ICD-10-CM | POA: Diagnosis not present

## 2017-06-15 DIAGNOSIS — R000 Tachycardia, unspecified: Secondary | ICD-10-CM | POA: Diagnosis not present

## 2017-06-15 DIAGNOSIS — Z79899 Other long term (current) drug therapy: Secondary | ICD-10-CM | POA: Diagnosis not present

## 2017-06-15 DIAGNOSIS — F17200 Nicotine dependence, unspecified, uncomplicated: Secondary | ICD-10-CM | POA: Insufficient documentation

## 2017-06-15 DIAGNOSIS — R4182 Altered mental status, unspecified: Secondary | ICD-10-CM | POA: Diagnosis not present

## 2017-06-15 DIAGNOSIS — R4020 Unspecified coma: Secondary | ICD-10-CM | POA: Diagnosis not present

## 2017-06-15 DIAGNOSIS — E876 Hypokalemia: Secondary | ICD-10-CM | POA: Insufficient documentation

## 2017-06-15 DIAGNOSIS — R4189 Other symptoms and signs involving cognitive functions and awareness: Secondary | ICD-10-CM

## 2017-06-15 LAB — RAPID URINE DRUG SCREEN, HOSP PERFORMED
AMPHETAMINES: NOT DETECTED
BENZODIAZEPINES: NOT DETECTED
Barbiturates: NOT DETECTED
Cocaine: NOT DETECTED
OPIATES: POSITIVE — AB
TETRAHYDROCANNABINOL: POSITIVE — AB

## 2017-06-15 LAB — COMPREHENSIVE METABOLIC PANEL
ALK PHOS: 62 U/L (ref 38–126)
ALT: 12 U/L — ABNORMAL LOW (ref 17–63)
ANION GAP: 20 — AB (ref 5–15)
AST: 28 U/L (ref 15–41)
Albumin: 5.1 g/dL — ABNORMAL HIGH (ref 3.5–5.0)
BUN: 11 mg/dL (ref 6–20)
CALCIUM: 9.7 mg/dL (ref 8.9–10.3)
CO2: 16 mmol/L — AB (ref 22–32)
Chloride: 101 mmol/L (ref 101–111)
Creatinine, Ser: 1.26 mg/dL — ABNORMAL HIGH (ref 0.61–1.24)
GFR calc non Af Amer: >60 mL/min (ref >60)
Glucose, Bld: 182 mg/dL — ABNORMAL HIGH (ref 65–99)
POTASSIUM: 2.3 mmol/L — AB (ref 3.5–5.1)
Sodium: 137 mmol/L (ref 135–145)
Total Bilirubin: 1.2 mg/dL (ref 0.3–1.2)
Total Protein: 8.4 g/dL — ABNORMAL HIGH (ref 6.5–8.1)

## 2017-06-15 LAB — BASIC METABOLIC PANEL
Anion gap: 9 (ref 5–15)
BUN: 11 mg/dL (ref 6–20)
CALCIUM: 8.4 mg/dL — AB (ref 8.9–10.3)
CHLORIDE: 106 mmol/L (ref 101–111)
CO2: 21 mmol/L — AB (ref 22–32)
CREATININE: 0.99 mg/dL (ref 0.61–1.24)
GFR calc Af Amer: >60 mL/min (ref >60)
GFR calc non Af Amer: >60 mL/min (ref >60)
Glucose, Bld: 94 mg/dL (ref 65–99)
Potassium: 2.9 mmol/L — ABNORMAL LOW (ref 3.5–5.1)
Sodium: 136 mmol/L (ref 135–145)

## 2017-06-15 LAB — CBC
HCT: 43.8 % (ref 39.0–52.0)
HEMOGLOBIN: 14.9 g/dL (ref 13.0–17.0)
MCH: 28.3 pg (ref 26.0–34.0)
MCHC: 34 g/dL (ref 30.0–36.0)
MCV: 83.1 fL (ref 78.0–100.0)
PLATELETS: 325 10*3/uL (ref 150–400)
RBC: 5.27 MIL/uL (ref 4.22–5.81)
RDW: 13.1 % (ref 11.5–15.5)
WBC: 7.4 10*3/uL (ref 4.0–10.5)

## 2017-06-15 LAB — ETHANOL: Alcohol, Ethyl (B): <10 mg/dL (ref <10)

## 2017-06-15 MED ORDER — POTASSIUM CHLORIDE CRYS ER 20 MEQ PO TBCR
40.0000 meq | EXTENDED_RELEASE_TABLET | Freq: Once | ORAL | Status: AC
  Administered 2017-06-15: 40 meq via ORAL
  Filled 2017-06-15: qty 2

## 2017-06-15 MED ORDER — SODIUM CHLORIDE 0.9 % IV BOLUS
1000.0000 mL | Freq: Once | INTRAVENOUS | Status: AC
  Administered 2017-06-15: 1000 mL via INTRAVENOUS

## 2017-06-15 NOTE — Discharge Instructions (Addendum)
Your evaluated in the emergency department for unresponsive episode.  You improved on your own and we do not have an obvious cause of this episode.  It was possibly a seizure or possibly a syncopal event.  It may also be related to your use of substances.  Your potassium was low here and to be given some extra doses to supplement this.  You should contact your counselor tomorrow and you should consider going in for medical detox.  Please return to the emergency department if any worsening symptoms.

## 2017-06-15 NOTE — ED Provider Notes (Signed)
[facility] EMERGENCY DEPARTMENT Provider Note   CSN: [id number] Arrival date & time: 06/15/17  1727     History   Chief Complaint Chief Complaint  Patient presents with  . Altered Mental Status    HPI Nicholas Harrison is a 27 y.o. male.  Patient has a history of polysubstance abuse.  He has been using oral narcotics and benzodiazepines.  He has been trying to self taper and last used Xanax about 2 or 3 days ago.  Today he was experiencing some opiate withdrawal symptoms and took a dose of Suboxone that he purchased.  He was found by bystanders and then please in a vehicle unresponsive.   police officer stated he was having some motor activity like a seizure with some eye rolling and then became more alert and combative.  He quickly cleared to the point where he is able to be transported in the police car and was answering questions.  On arrival here the patient is amnestic to the episode.  He is currently denying any medical complaints.  He states he had a prior seizure in the past but does not know what the precipitant was.  He denies any recent illnesses.  He denies any injection drug use.  He denies this was a suicide attempt.  The history is provided by the patient and the police.  Seizures   This is a new problem. The current episode started less than 1 hour ago. The problem has been resolved. There was 1 seizure. Duration: unknown - brief. Associated symptoms include confusion. Pertinent negatives include no headaches, no speech difficulty, no visual disturbance, no neck stiffness, no sore throat, no chest pain, no cough, no nausea, no vomiting, no diarrhea and no muscle weakness. Characteristics include rhythmic jerking and loss of consciousness. Characteristics do not include bowel incontinence or bit tongue. The episode was witnessed. There was no sensation of an aura present. The seizures did not continue in the ED. Focality: drugs/withdrawl. There has been no fever. There were no  medications administered prior to arrival.    History reviewed. No pertinent past medical history.  Patient Active Problem List   Diagnosis Date Noted  . 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 Polysubstance abuse (HCC) 10/19/2016  . Benzodiazepine dependence, episodic (HCC) 10/14/2016  . Moderate tetrahydrocannabinol (THC) dependence (HCC) 05/31/2016  . Opioid use disorder, severe, dependence ([facility]) 10/29/2014  . ADD (attention deficit disorder) 02/15/2012  . Well adult exam 09/02/2011  . Rash 09/02/2011  . Chronic anxiety 06/05/2011    Past Surgical History:  Procedure Laterality Date  . HAND SURGERY          Home Medications    Prior to Admission medications   Medication Sig Start Date End Date Taking? Authorizing Provider  atomoxetine (STRATTERA) 40 MG capsule Take 1 cap daily for 7 days then increase to 2 caps daily 10/24/16   Court Joy, PA-C  escitalopram (LEXAPRO) 20 MG tablet Take 1 tablet (20 mg total) by mouth daily. Take in PM 10/19/16   Court Joy, PA-C  ibuprofen (ADVIL,MOTRIN) 800 MG tablet Take 1 tablet (800 mg total) by mouth every 8 (eight) hours as needed. 09/14/16   Vickki Hearing, MD  propranolol (INDERAL) 10 MG tablet Take 1 tablet (10 mg total) by mouth 3 (three) times daily. 10/19/16   Court Joy, PA-C  QUEtiapine (SEROQUEL) 25 MG tablet Take 1-2 tabs at bedtime 10/19/16   Court Joy, PA-C  Family History Family History  Problem Relation Age of Onset  . Depression Mother        anxiety  . Diabetes Maternal Grandfather     Social History Social History   Tobacco Use  . Smoking status: Current Every Day Smoker  . Smokeless tobacco: Former Neurosurgeon    Types: Chew  . Tobacco comment: only experimented  Substance Use Topics  . Alcohol use: No  . Drug use: Yes     Allergies   Buspar [buspirone]   Review of Systems Review of Systems  Constitutional: Negative for chills and fever.    HENT: Negative for ear pain and sore throat.   Eyes: Negative for pain and visual disturbance.  Respiratory: Negative for cough and shortness of breath.   Cardiovascular: Negative for chest pain and palpitations.  Gastrointestinal: Negative for abdominal pain, bowel incontinence, diarrhea, nausea and vomiting.  Genitourinary: Negative for dysuria and hematuria.  Musculoskeletal: Negative for arthralgias and back pain.  Skin: Negative for color change and rash.  Neurological: Positive for seizures and loss of consciousness. Negative for syncope, speech difficulty and headaches.  Psychiatric/Behavioral: Positive for confusion.  All other systems reviewed and are negative.    Physical Exam Updated Vital Signs BP (!) 151/87 (BP Location: Right Arm)   Pulse (!) 137   Temp 98.1 F (36.7 C) (Oral)   Resp (!) 22   Ht 5\' 9"  (1.753 m)   Wt 68 kg (150 lb)   SpO2 98%   BMI 22.15 kg/m   Physical Exam  Constitutional: He is oriented to person, place, and time. He appears well-developed and well-nourished.  HENT:  Head: Normocephalic and atraumatic.  Eyes: Conjunctivae are normal.  Neck: Neck supple.  Cardiovascular: Regular rhythm. Tachycardia present.  No murmur heard. Pulmonary/Chest: Effort normal and breath sounds normal. No respiratory distress.  Abdominal: Soft. There is no tenderness.  Musculoskeletal: He exhibits no edema.  Neurological: He is alert and oriented to person, place, and time. He has normal strength. No cranial nerve deficit or sensory deficit. Coordination normal. GCS eye subscore is 4. GCS verbal subscore is 5. GCS motor subscore is 6.  Skin: Skin is warm and dry.  No obvious track marks.  Psychiatric: He has a normal mood and affect.  Nursing note and vitals reviewed.    ED Treatments / Results  Labs (all labs ordered are listed, but only abnormal results are displayed) Labs Reviewed  COMPREHENSIVE METABOLIC PANEL - Abnormal; Notable for the following  components:      Result Value   Potassium 2.3 (*)    CO2 16 (*)    Glucose, Bld 182 (*)    Creatinine, Ser 1.26 (*)    Total Protein 8.4 (*)    Albumin 5.1 (*)    ALT 12 (*)    Anion gap 20 (*)    All other components within normal limits  RAPID URINE DRUG SCREEN, HOSP PERFORMED - Abnormal; Notable for the following components:   Opiates POSITIVE (*)    Tetrahydrocannabinol POSITIVE (*)    All other components within normal limits  BASIC METABOLIC PANEL - Abnormal; Notable for the following components:   Potassium 2.9 (*)    CO2 21 (*)    Calcium 8.4 (*)    All other components within normal limits  ETHANOL  CBC    EKG EKG Interpretation  Date/Time:  Thursday June 15 2017 17:36:23 EDT Ventricular Rate:  120 PR Interval:    QRS Duration: 117 QT Interval:  311 QTC Calculation: 440 R Axis:   90 Text Interpretation:  Sinus tachycardia Consider right atrial enlargement Incomplete right bundle branch block no prior to compare with Confirmed by Meridee ScoreButler, Trelyn Vanderlinde [phone]([id number]) on 06/15/2017 5:56:21 PM   Radiology Ct Head Wo Contrast  Result Date: 06/15/2017 CLINICAL DATA:  Seizure with altered mental status EXAM: CT HEAD WITHOUT CONTRAST TECHNIQUE: Contiguous axial images were obtained from the base of the skull through the vertex without intravenous contrast. COMPARISON:  None. FINDINGS: Brain: The ventricles are normal in size and configuration. There is no intracranial mass, hemorrhage, extra-axial fluid collection, or midline shift. Gray-white compartments are normal. No evident acute infarct. Vascular: No hyperdense vessel.  No evident vascular calcification. Skull: Bony calvarium appears intact. Sinuses/Orbits: Visualized paranasal sinuses are clear. Visualized orbits appear symmetric bilaterally. Other: Visualized mastoid air cells are clear. IMPRESSION: Study within normal limits. Electronically Signed   By: Bretta BangWilliam  Woodruff III M.D.   On: 06/15/2017 20:08     Procedures Procedures (including critical care time)  Medications Ordered in ED Medications  sodium chloride 0.9 % bolus 1,000 mL (0 mLs Intravenous Stopped 06/15/17 1911)  potassium chloride SA (K-DUR,KLOR-CON) CR tablet 40 mEq (40 mEq Oral Given 06/15/17 1917)  sodium chloride 0.9 % bolus 1,000 mL (0 mLs Intravenous Stopped 06/15/17 2018)  potassium chloride SA (K-DUR,KLOR-CON) CR tablet 40 mEq (40 mEq Oral Given 06/15/17 2147)     Initial Impression / Assessment and Plan / ED Course  I have reviewed the triage vital signs and the nursing notes.  Pertinent labs & imaging results that were available during my care of the patient were reviewed by me and considered in my medical decision making (see chart for details).  Clinical Course as of Jun 18 1715  Thu Jun 15, 2017  1902 Patient story sounds more like seizure than anything.  It is possibly related to his psychiatric medications but also he had been abusing benzos and was tapering.  The fact that he return to her normal mental status without any reversal agent like Narcan makes me think that it was less likely to be an opiate overdose but is still possible.  Here he is improving and his heart rate has improved also.  On labs his potassium was 2.3 this may be due to shifts with a lactic acidosis of seizure.  We will give him some oral repletion and some fluids will be need to recheck.   [MB]  2203 Reviewed with patient and his mother the results of his testing.  I feel it likely is related to his substance use or possible withdrawal symptoms.  He has been awake and alert here and wishes for discharge.  He is Artie set up with counseling and I recommended that he follow-up with them.  He understands to return if any worsening symptoms.   [MB]    Clinical Course User Index [MB] Terrilee FilesButler, Narya Beavin C, MD    Final Clinical Impressions(s) / ED Diagnoses   Final diagnoses:  Unresponsive episode  Hypokalemia    ED Discharge Orders     None       Terrilee FilesButler, Cresencia Asmus C, MD 06/17/17 1718
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
## 2017-06-15 NOTE — ED Notes (Signed)
Date and time results received: 06/15/17 1900 (use smartphrase ".now" to insert current time)  Test: potassium Critical Value: 2.3  Name of Provider Notified: Dr. Charm BargesButler at [phone]  Orders Received? Or Actions Taken?: no/na

## 2017-06-22 DIAGNOSIS — F411 Generalized anxiety disorder: Secondary | ICD-10-CM | POA: Diagnosis not present

## 2017-07-12 DIAGNOSIS — F411 Generalized anxiety disorder: Secondary | ICD-10-CM | POA: Diagnosis not present

## 2017-07-19 DIAGNOSIS — F411 Generalized anxiety disorder: Secondary | ICD-10-CM | POA: Diagnosis not present

## 2017-08-02 ENCOUNTER — Emergency Department (HOSPITAL_COMMUNITY)
Admission: EM | Admit: 2017-08-02 | Discharge: 2017-08-02 | Disposition: A | Discharge status: Home / Self Care | Payer: BLUE CROSS/BLUE SHIELD | Attending: Emergency Medicine | Admitting: Emergency Medicine

## 2017-08-02 ENCOUNTER — Other Ambulatory Visit: Payer: Self-pay

## 2017-08-02 ENCOUNTER — Encounter (HOSPITAL_COMMUNITY): Payer: Self-pay | Admitting: Cardiology

## 2017-08-02 DIAGNOSIS — Z79899 Other long term (current) drug therapy: Secondary | ICD-10-CM | POA: Insufficient documentation

## 2017-08-02 DIAGNOSIS — F1910 Other psychoactive substance abuse, uncomplicated: Secondary | ICD-10-CM | POA: Insufficient documentation

## 2017-08-02 DIAGNOSIS — R569 Unspecified convulsions: Secondary | ICD-10-CM | POA: Insufficient documentation

## 2017-08-02 DIAGNOSIS — F1990 Other psychoactive substance use, unspecified, uncomplicated: Secondary | ICD-10-CM

## 2017-08-02 HISTORY — DX: Unspecified convulsions: R56.9

## 2017-08-02 LAB — COMPREHENSIVE METABOLIC PANEL
ALK PHOS: 64 U/L (ref 38–126)
AST: 31 U/L (ref 15–41)
Albumin: 4.9 g/dL (ref 3.5–5.0)
Anion gap: 17 — ABNORMAL HIGH (ref 5–15)
BILIRUBIN TOTAL: 0.9 mg/dL (ref 0.3–1.2)
BUN: 10 mg/dL (ref 6–20)
CALCIUM: 9.7 mg/dL (ref 8.9–10.3)
CO2: 20 mmol/L — ABNORMAL LOW (ref 22–32)
CREATININE: 1.09 mg/dL (ref 0.61–1.24)
Chloride: 99 mmol/L — ABNORMAL LOW (ref 101–111)
GFR calc Af Amer: >60 mL/min (ref >60)
Glucose, Bld: 136 mg/dL — ABNORMAL HIGH (ref 65–99)
Potassium: 2.9 mmol/L — ABNORMAL LOW (ref 3.5–5.1)
Sodium: 136 mmol/L (ref 135–145)
TOTAL PROTEIN: 8 g/dL (ref 6.5–8.1)

## 2017-08-02 LAB — CBC WITH DIFFERENTIAL/PLATELET
BASOS ABS: 0 10*3/uL (ref 0.0–0.1)
Basophils Relative: 0 %
Eosinophils Absolute: 0 10*3/uL (ref 0.0–0.7)
Eosinophils Relative: 0 %
HEMATOCRIT: 45.4 % (ref 39.0–52.0)
HEMOGLOBIN: 15.3 g/dL (ref 13.0–17.0)
LYMPHS PCT: 33 %
Lymphs Abs: 3.2 10*3/uL (ref 0.7–4.0)
MCH: 28.5 pg (ref 26.0–34.0)
MCHC: 33.7 g/dL (ref 30.0–36.0)
MCV: 84.7 fL (ref 78.0–100.0)
MONO ABS: 0.9 10*3/uL (ref 0.1–1.0)
Monocytes Relative: 9 %
Neutro Abs: 5.6 10*3/uL (ref 1.7–7.7)
Neutrophils Relative %: 58 %
Platelets: 297 10*3/uL (ref 150–400)
RBC: 5.36 MIL/uL (ref 4.22–5.81)
RDW: 13.3 % (ref 11.5–15.5)
WBC: 9.7 10*3/uL (ref 4.0–10.5)

## 2017-08-02 LAB — RAPID URINE DRUG SCREEN, HOSP PERFORMED
Amphetamines: NOT DETECTED
Barbiturates: NOT DETECTED
Benzodiazepines: POSITIVE — AB
Cocaine: NOT DETECTED
OPIATES: POSITIVE — AB
Tetrahydrocannabinol: POSITIVE — AB

## 2017-08-02 MED ORDER — POTASSIUM CHLORIDE CRYS ER 20 MEQ PO TBCR
40.0000 meq | EXTENDED_RELEASE_TABLET | Freq: Once | ORAL | Status: AC
  Administered 2017-08-02: 40 meq via ORAL
  Filled 2017-08-02: qty 2

## 2017-08-02 MED ORDER — SODIUM CHLORIDE 0.9 % IV BOLUS
1000.0000 mL | Freq: Once | INTRAVENOUS | Status: AC
  Administered 2017-08-02: 1000 mL via INTRAVENOUS

## 2017-08-02 NOTE — ED Triage Notes (Signed)
EMS riding down road and seen pt having seizure on lawnmower, pt went unresponsive,  narcan was given by EMS. Pt states he is alert and unaware of what he took today.

## 2017-08-02 NOTE — ED Provider Notes (Signed)
[facility] EMERGENCY DEPARTMENT Provider Note   CSN: [id number] Arrival date & time: 08/02/17  1716     History   Chief Complaint Chief Complaint  Patient presents with  . Seizures    HPI Nicholas Harrison is a 27 y.o. male.  Level 5 caveat secondary to altered mental status.  Patient was noted by police to be on a 0 turn mower unresponsive going in circles in the road.  Somewhere along the way the patient slumped off the mower which stopped and EMS found him unresponsive.  They gave him Narcan with improvement in his mental status to the point where he is alert but confused.  Patient currently here is awake not oriented to time or place answer some questions well and others talks nonsense.  He denies any drugs or medical problems or medications.  The history is provided by the patient and the EMS personnel.  Altered Mental Status   This is a new problem. The current episode started less than 1 hour ago. The problem has been gradually improving. Associated symptoms include confusion and unresponsiveness. Risk factors include illicit drug use.    History reviewed. No pertinent past medical history.  Patient Active Problem List   Diagnosis Date Noted  . 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 Polysubstance abuse (HCC) 10/19/2016  . Benzodiazepine dependence, episodic (HCC) 10/14/2016  . Moderate tetrahydrocannabinol (THC) dependence (HCC) 05/31/2016  . Opioid use disorder, severe, dependence (HCC) 10/29/2014  . ADD (attention deficit disorder) 02/15/2012  . Well adult exam 09/02/2011  . Rash 09/02/2011  . Chronic anxiety 06/05/2011    Past Surgical History:  Procedure Laterality Date  . HAND SURGERY          Home Medications    Prior to Admission medications   Medication Sig Start Date End Date Taking? Authorizing Provider  atomoxetine (STRATTERA) 40 MG capsule Take 1 cap daily for 7 days then increase to 2 caps daily Patient not  taking: Reported on 06/15/2017 10/24/16   Court Joy, PA-C  escitalopram (LEXAPRO) 20 MG tablet Take 1 tablet (20 mg total) by mouth daily. Take in PM 10/19/16   Court Joy, PA-C  ibuprofen (ADVIL,MOTRIN) 800 MG tablet Take 1 tablet (800 mg total) by mouth every 8 (eight) hours as needed. Patient not taking: Reported on 06/15/2017 09/14/16   Vickki Hearing, MD  propranolol (INDERAL) 10 MG tablet Take 1 tablet (10 mg total) by mouth 3 (three) times daily. Patient not taking: Reported on 06/15/2017 10/19/16   Court Joy, PA-C  QUEtiapine (SEROQUEL) 25 MG tablet Take 1-2 tabs at bedtime Patient not taking: Reported on 06/15/2017 10/19/16   Court Joy, PA-C    Family History Family History  Problem Relation Age of Onset  . Depression Mother        anxiety  . Diabetes Maternal Grandfather     Social History Social History   Tobacco Use  . Smoking status: Never Smoker  . Smokeless tobacco: Former Neurosurgeon    Types: Chew  . Tobacco comment: only experimented  Substance Use Topics  . Alcohol use: No  . Drug use: Yes     Allergies   Buspar [buspirone]   Review of Systems Review of Systems  Unable to perform ROS: Mental status change  Psychiatric/Behavioral: Positive for confusion.     Physical Exam Updated Vital Signs Ht  (1.803 m)   Wt 68 kg (150 lb)   BMI  20.92 kg/m   Physical Exam  Constitutional: He appears well-developed and well-nourished.  HENT:  Head: Normocephalic and atraumatic.  Eyes: Conjunctivae are normal.  Neck: Neck supple.  Cardiovascular: Regular rhythm. Tachycardia present.  Pulmonary/Chest: Effort normal.  Neurological: He is alert. He has normal strength. He is disoriented (time and place). No cranial nerve deficit or sensory deficit. GCS eye subscore is 4. GCS verbal subscore is 5. GCS motor subscore is 6.  Skin: Skin is warm and dry.  Psychiatric: He has a normal mood and affect.  Nursing note and vitals reviewed.    ED  Treatments / Results  Labs (all labs ordered are listed, but only abnormal results are displayed) Labs Reviewed  COMPREHENSIVE METABOLIC PANEL - Abnormal; Notable for the following components:      Result Value   Potassium 2.9 (*)    Chloride 99 (*)    CO2 20 (*)    Glucose, Bld 136 (*)    ALT <5 (*)    Anion gap 17 (*)    All other components within normal limits  RAPID URINE DRUG SCREEN, HOSP PERFORMED - Abnormal; Notable for the following components:   Opiates POSITIVE (*)    Benzodiazepines POSITIVE (*)    Tetrahydrocannabinol POSITIVE (*)    All other components within normal limits  CBC WITH DIFFERENTIAL/PLATELET    EKG EKG Interpretation  Date/Time:  Wednesday Aug 02 2017 17:20:30 EDT Ventricular Rate:  134 PR Interval:    QRS Duration: 109 QT Interval:  285 QTC Calculation: 426 R Axis:   82 Text Interpretation:  Sinus tachycardia LAE, consider biatrial enlargement RSR' in V1 or V2, right VCD or RVH similar to prior ecg 3/19 Confirmed by Meridee Score [phone]) on 08/02/2017 5:28:26 PM   Radiology No results found.  Procedures Procedures (including critical care time)  Medications Ordered in ED Medications  sodium chloride 0.9 % bolus 1,000 mL (0 mLs Intravenous Stopped 08/02/17 1849)  potassium chloride SA (K-DUR,KLOR-CON) CR tablet 40 mEq (40 mEq Oral Given 08/02/17 1856)     Initial Impression / Assessment and Plan / ED Course  I have reviewed the triage vital signs and the nursing notes.  Pertinent labs & imaging results that were available during my care of the patient were reviewed by me and considered in my medical decision making (see chart for details).  Clinical Course as of Aug 04 1017  Wed Aug 02, 2017  1727 Patient is here after period of unresponsiveness and now altered.  He had improvement in his level of consciousness with Narcan.  On review of his prior notes he has been seen here for polysubstance issues.  Also any obvious track marks on  him.  His pupils are dilated he is oriented only to self.  He is getting some lab work and IV fluids and will continue to observe him.   [MB]  1749 Reevaluated-remains tachycardic although seems very comfortably sitting there in bed.  IV fluids infusing.   [MB]  1847 Reevaluated patient his tachycardia is resolved.  He is alert oriented still unable to explain what happened today.  Father is here now questioning if we did a drug screen.  I informed him yes and that its pending.   [MB]  1955 I reviewed the patient's results with him and he said it was okay to also review them with his father.  Let him know that I think this is likely related to substance use and the patient did not confirm or deny  this.  I offered him substance use resources but his dad said they have tried that before and he never listens.  I do think having a follow-up with neurology if he would do it would be reasonable because of 2 unresponsive episodes that possibly her seizures.  I instructed to him that he not drive until he is followed up with the neurologist.   [MB]    Clinical Course User Index [MB] Terrilee Files, MD     Final Clinical Impressions(s) / ED Diagnoses   Final diagnoses:  Seizure-like activity [facility])  Substance use disorder    ED Discharge Orders    None       Terrilee Files, MD 08/04/17 1021

## 2017-08-02 NOTE — Discharge Instructions (Signed)
You were evaluated in the emergency department for an unresponsive episode.  Your lab work showed your potassium was low and that you were positive for opiates benzodiazepines and THC.  We are recommending to you to follow-up with neurology and consider detox or help with substance abuse.  You should not operate a motor vehicle until you follow-up with neurology.  Please return if any worsening symptoms.

## 2017-08-02 NOTE — ED Notes (Signed)
Pt states that he took "a bunch of opiates" yesterday

## 2017-08-09 DIAGNOSIS — F411 Generalized anxiety disorder: Secondary | ICD-10-CM | POA: Diagnosis not present

## 2017-08-10 DIAGNOSIS — Z79899 Other long term (current) drug therapy: Secondary | ICD-10-CM | POA: Diagnosis not present

## 2017-08-10 DIAGNOSIS — F1920 Other psychoactive substance dependence, uncomplicated: Secondary | ICD-10-CM | POA: Diagnosis not present

## 2017-08-10 DIAGNOSIS — F1123 Opioid dependence with withdrawal: Secondary | ICD-10-CM | POA: Diagnosis not present

## 2017-08-10 DIAGNOSIS — F331 Major depressive disorder, recurrent, moderate: Secondary | ICD-10-CM | POA: Diagnosis not present

## 2017-08-15 DIAGNOSIS — Z79899 Other long term (current) drug therapy: Secondary | ICD-10-CM | POA: Diagnosis not present

## 2017-08-15 DIAGNOSIS — F1320 Sedative, hypnotic or anxiolytic dependence, uncomplicated: Secondary | ICD-10-CM | POA: Diagnosis not present

## 2017-08-15 DIAGNOSIS — F1120 Opioid dependence, uncomplicated: Secondary | ICD-10-CM | POA: Diagnosis not present

## 2017-08-15 DIAGNOSIS — F411 Generalized anxiety disorder: Secondary | ICD-10-CM | POA: Diagnosis not present

## 2017-08-22 DIAGNOSIS — F332 Major depressive disorder, recurrent severe without psychotic features: Secondary | ICD-10-CM | POA: Diagnosis not present

## 2017-08-22 DIAGNOSIS — Z79899 Other long term (current) drug therapy: Secondary | ICD-10-CM | POA: Diagnosis not present

## 2017-08-22 DIAGNOSIS — F1120 Opioid dependence, uncomplicated: Secondary | ICD-10-CM | POA: Diagnosis not present

## 2017-08-31 DIAGNOSIS — Z79899 Other long term (current) drug therapy: Secondary | ICD-10-CM | POA: Diagnosis not present

## 2017-08-31 DIAGNOSIS — F1320 Sedative, hypnotic or anxiolytic dependence, uncomplicated: Secondary | ICD-10-CM | POA: Diagnosis not present

## 2017-08-31 DIAGNOSIS — F1120 Opioid dependence, uncomplicated: Secondary | ICD-10-CM | POA: Diagnosis not present

## 2017-09-06 DIAGNOSIS — F411 Generalized anxiety disorder: Secondary | ICD-10-CM | POA: Diagnosis not present

## 2017-09-13 DIAGNOSIS — Z79899 Other long term (current) drug therapy: Secondary | ICD-10-CM | POA: Diagnosis not present

## 2017-09-13 DIAGNOSIS — F1520 Other stimulant dependence, uncomplicated: Secondary | ICD-10-CM | POA: Diagnosis not present

## 2017-09-13 DIAGNOSIS — F1120 Opioid dependence, uncomplicated: Secondary | ICD-10-CM | POA: Diagnosis not present

## 2017-09-14 DIAGNOSIS — F411 Generalized anxiety disorder: Secondary | ICD-10-CM | POA: Diagnosis not present

## 2017-09-27 DIAGNOSIS — F411 Generalized anxiety disorder: Secondary | ICD-10-CM | POA: Diagnosis not present

## 2017-10-05 DIAGNOSIS — F411 Generalized anxiety disorder: Secondary | ICD-10-CM | POA: Diagnosis not present

## 2017-10-12 DIAGNOSIS — Z79899 Other long term (current) drug therapy: Secondary | ICD-10-CM | POA: Diagnosis not present

## 2017-10-26 DIAGNOSIS — F411 Generalized anxiety disorder: Secondary | ICD-10-CM | POA: Diagnosis not present

## 2017-10-26 DIAGNOSIS — F1520 Other stimulant dependence, uncomplicated: Secondary | ICD-10-CM | POA: Diagnosis not present

## 2017-10-26 DIAGNOSIS — Z79899 Other long term (current) drug therapy: Secondary | ICD-10-CM | POA: Diagnosis not present

## 2017-10-26 DIAGNOSIS — F1120 Opioid dependence, uncomplicated: Secondary | ICD-10-CM | POA: Diagnosis not present

## 2017-10-30 DIAGNOSIS — F1120 Opioid dependence, uncomplicated: Secondary | ICD-10-CM | POA: Diagnosis not present

## 2017-10-30 DIAGNOSIS — F1320 Sedative, hypnotic or anxiolytic dependence, uncomplicated: Secondary | ICD-10-CM | POA: Diagnosis not present

## 2017-10-30 DIAGNOSIS — Z79899 Other long term (current) drug therapy: Secondary | ICD-10-CM | POA: Diagnosis not present

## 2017-11-01 DIAGNOSIS — F411 Generalized anxiety disorder: Secondary | ICD-10-CM | POA: Diagnosis not present

## 2017-11-07 ENCOUNTER — Encounter (HOSPITAL_COMMUNITY): Payer: Self-pay | Admitting: *Deleted

## 2017-11-07 ENCOUNTER — Emergency Department (HOSPITAL_COMMUNITY): Payer: BLUE CROSS/BLUE SHIELD

## 2017-11-07 ENCOUNTER — Other Ambulatory Visit: Payer: Self-pay

## 2017-11-07 ENCOUNTER — Inpatient Hospital Stay (HOSPITAL_COMMUNITY)
Admission: AD | Admit: 2017-11-07 | Discharge: 2017-11-13 | DRG: 885 | Disposition: A | Discharge status: Home / Self Care | Payer: BLUE CROSS/BLUE SHIELD | Source: Intra-hospital | Attending: Psychiatry | Admitting: Psychiatry

## 2017-11-07 ENCOUNTER — Emergency Department (HOSPITAL_COMMUNITY)
Admission: EM | Admit: 2017-11-07 | Discharge: 2017-11-07 | Disposition: A | Discharge status: Home / Self Care | Payer: BLUE CROSS/BLUE SHIELD | Attending: Emergency Medicine | Admitting: Emergency Medicine

## 2017-11-07 ENCOUNTER — Encounter (HOSPITAL_COMMUNITY): Payer: Self-pay

## 2017-11-07 DIAGNOSIS — F1220 Cannabis dependence, uncomplicated: Secondary | ICD-10-CM | POA: Diagnosis present

## 2017-11-07 DIAGNOSIS — F1320 Sedative, hypnotic or anxiolytic dependence, uncomplicated: Secondary | ICD-10-CM | POA: Insufficient documentation

## 2017-11-07 DIAGNOSIS — F332 Major depressive disorder, recurrent severe without psychotic features: Secondary | ICD-10-CM | POA: Diagnosis not present

## 2017-11-07 DIAGNOSIS — R443 Hallucinations, unspecified: Secondary | ICD-10-CM

## 2017-11-07 DIAGNOSIS — F1120 Opioid dependence, uncomplicated: Secondary | ICD-10-CM | POA: Insufficient documentation

## 2017-11-07 DIAGNOSIS — Z87891 Personal history of nicotine dependence: Secondary | ICD-10-CM | POA: Diagnosis not present

## 2017-11-07 DIAGNOSIS — G9389 Other specified disorders of brain: Secondary | ICD-10-CM | POA: Diagnosis not present

## 2017-11-07 DIAGNOSIS — Z811 Family history of alcohol abuse and dependence: Secondary | ICD-10-CM | POA: Diagnosis not present

## 2017-11-07 DIAGNOSIS — F1123 Opioid dependence with withdrawal: Secondary | ICD-10-CM | POA: Diagnosis present

## 2017-11-07 DIAGNOSIS — F13239 Sedative, hypnotic or anxiolytic dependence with withdrawal, unspecified: Secondary | ICD-10-CM | POA: Diagnosis present

## 2017-11-07 DIAGNOSIS — F329 Major depressive disorder, single episode, unspecified: Secondary | ICD-10-CM | POA: Diagnosis not present

## 2017-11-07 DIAGNOSIS — Z008 Encounter for other general examination: Secondary | ICD-10-CM | POA: Insufficient documentation

## 2017-11-07 DIAGNOSIS — I10 Essential (primary) hypertension: Secondary | ICD-10-CM | POA: Diagnosis present

## 2017-11-07 DIAGNOSIS — F1910 Other psychoactive substance abuse, uncomplicated: Secondary | ICD-10-CM

## 2017-11-07 DIAGNOSIS — G4700 Insomnia, unspecified: Secondary | ICD-10-CM | POA: Diagnosis present

## 2017-11-07 DIAGNOSIS — R410 Disorientation, unspecified: Secondary | ICD-10-CM | POA: Diagnosis not present

## 2017-11-07 DIAGNOSIS — F1193 Opioid use, unspecified with withdrawal: Secondary | ICD-10-CM

## 2017-11-07 DIAGNOSIS — Z818 Family history of other mental and behavioral disorders: Secondary | ICD-10-CM

## 2017-11-07 DIAGNOSIS — F419 Anxiety disorder, unspecified: Secondary | ICD-10-CM | POA: Diagnosis not present

## 2017-11-07 LAB — URINALYSIS, ROUTINE W REFLEX MICROSCOPIC
BACTERIA UA: NONE SEEN
BILIRUBIN URINE: NEGATIVE
Glucose, UA: NEGATIVE mg/dL
HGB URINE DIPSTICK: NEGATIVE
KETONES UR: 80 mg/dL — AB
LEUKOCYTES UA: NEGATIVE
NITRITE: NEGATIVE
PH: 8 (ref 5.0–8.0)
Protein, ur: 100 mg/dL — AB
Specific Gravity, Urine: 1.027 (ref 1.005–1.030)

## 2017-11-07 LAB — COMPREHENSIVE METABOLIC PANEL
ALT: 12 U/L (ref 0–44)
AST: 14 U/L — AB (ref 15–41)
Albumin: 5.5 g/dL — ABNORMAL HIGH (ref 3.5–5.0)
Alkaline Phosphatase: 61 U/L (ref 38–126)
Anion gap: 10 (ref 5–15)
BUN: 11 mg/dL (ref 6–20)
CO2: 29 mmol/L (ref 22–32)
Calcium: 10.2 mg/dL (ref 8.9–10.3)
Chloride: 104 mmol/L (ref 98–111)
Creatinine, Ser: 0.65 mg/dL (ref 0.61–1.24)
GFR calc Af Amer: >60 mL/min (ref >60)
Glucose, Bld: 131 mg/dL — ABNORMAL HIGH (ref 70–99)
Potassium: 4.4 mmol/L (ref 3.5–5.1)
Sodium: 143 mmol/L (ref 135–145)
TOTAL PROTEIN: 8.4 g/dL — AB (ref 6.5–8.1)
Total Bilirubin: 1.7 mg/dL — ABNORMAL HIGH (ref 0.3–1.2)

## 2017-11-07 LAB — RAPID URINE DRUG SCREEN, HOSP PERFORMED
Amphetamines: NOT DETECTED
Barbiturates: NOT DETECTED
Benzodiazepines: POSITIVE — AB
Cocaine: NOT DETECTED
Opiates: NOT DETECTED
Tetrahydrocannabinol: POSITIVE — AB

## 2017-11-07 LAB — ACETAMINOPHEN LEVEL

## 2017-11-07 LAB — CBC WITH DIFFERENTIAL/PLATELET
Basophils Absolute: 0 10*3/uL (ref 0.0–0.1)
Basophils Relative: 0 %
EOS ABS: 0 10*3/uL (ref 0.0–0.7)
Eosinophils Relative: 0 %
HCT: 44 % (ref 39.0–52.0)
HEMOGLOBIN: 15.2 g/dL (ref 13.0–17.0)
LYMPHS ABS: 0.9 10*3/uL (ref 0.7–4.0)
Lymphocytes Relative: 13 %
MCH: 29.5 pg (ref 26.0–34.0)
MCHC: 34.5 g/dL (ref 30.0–36.0)
MCV: 85.3 fL (ref 78.0–100.0)
Monocytes Absolute: 0.5 10*3/uL (ref 0.1–1.0)
Monocytes Relative: 7 %
NEUTROS PCT: 80 %
Neutro Abs: 5.2 10*3/uL (ref 1.7–7.7)
Platelets: 214 10*3/uL (ref 150–400)
RBC: 5.16 MIL/uL (ref 4.22–5.81)
RDW: 13.9 % (ref 11.5–15.5)
WBC: 6.6 10*3/uL (ref 4.0–10.5)

## 2017-11-07 LAB — ETHANOL

## 2017-11-07 LAB — SALICYLATE LEVEL: Salicylate Lvl: <7 mg/dL (ref 2.8–30.0)

## 2017-11-07 MED ORDER — CHLORDIAZEPOXIDE HCL 25 MG PO CAPS
25.0000 mg | ORAL_CAPSULE | Freq: Once | ORAL | Status: AC
  Administered 2017-11-07: 25 mg via ORAL
  Filled 2017-11-07: qty 1

## 2017-11-07 MED ORDER — SODIUM CHLORIDE 0.9 % IV BOLUS
1000.0000 mL | Freq: Once | INTRAVENOUS | Status: AC
  Administered 2017-11-07: 1000 mL via INTRAVENOUS

## 2017-11-07 MED ORDER — CLONIDINE HCL 0.1 MG PO TABS
0.1000 mg | ORAL_TABLET | ORAL | Status: AC
  Administered 2017-11-10 – 2017-11-12 (×4): 0.1 mg via ORAL
  Filled 2017-11-07 (×4): qty 1

## 2017-11-07 MED ORDER — HYDROXYZINE HCL 25 MG PO TABS
25.0000 mg | ORAL_TABLET | Freq: Four times a day (QID) | ORAL | Status: AC | PRN
  Administered 2017-11-07 – 2017-11-09 (×2): 25 mg via ORAL
  Filled 2017-11-07 (×2): qty 1

## 2017-11-07 MED ORDER — ADULT MULTIVITAMIN W/MINERALS CH
1.0000 | ORAL_TABLET | Freq: Every day | ORAL | Status: DC
  Administered 2017-11-08 – 2017-11-13 (×6): 1 via ORAL
  Filled 2017-11-07 (×7): qty 1

## 2017-11-07 MED ORDER — LORAZEPAM 1 MG PO TABS: 0.0000 mg | ORAL_TABLET | Freq: Four times a day (QID) | ORAL | Status: DC

## 2017-11-07 MED ORDER — CLONIDINE HCL 0.1 MG PO TABS
0.1000 mg | ORAL_TABLET | Freq: Every day | ORAL | Status: DC
  Administered 2017-11-13: 0.1 mg via ORAL
  Filled 2017-11-07 (×2): qty 1

## 2017-11-07 MED ORDER — CLONIDINE HCL 0.1 MG PO TABS
0.1000 mg | ORAL_TABLET | Freq: Four times a day (QID) | ORAL | Status: AC
  Administered 2017-11-07 – 2017-11-10 (×9): 0.1 mg via ORAL
  Filled 2017-11-07 (×14): qty 1

## 2017-11-07 MED ORDER — CHLORDIAZEPOXIDE HCL 25 MG PO CAPS: 25.0000 mg | ORAL_CAPSULE | Freq: Four times a day (QID) | ORAL | Status: AC | PRN

## 2017-11-07 MED ORDER — CHLORDIAZEPOXIDE HCL 25 MG PO CAPS
25.0000 mg | ORAL_CAPSULE | Freq: Every day | ORAL | Status: AC
  Administered 2017-11-12: 25 mg via ORAL
  Filled 2017-11-07: qty 1

## 2017-11-07 MED ORDER — NAPROXEN 500 MG PO TABS
500.0000 mg | ORAL_TABLET | Freq: Two times a day (BID) | ORAL | Status: AC | PRN
  Administered 2017-11-08 – 2017-11-09 (×2): 500 mg via ORAL
  Filled 2017-11-07 (×3): qty 1

## 2017-11-07 MED ORDER — TRAZODONE HCL 50 MG PO TABS
50.0000 mg | ORAL_TABLET | Freq: Every evening | ORAL | Status: DC | PRN
  Administered 2017-11-07 – 2017-11-08 (×2): 50 mg via ORAL
  Filled 2017-11-07 (×6): qty 1

## 2017-11-07 MED ORDER — ONDANSETRON 4 MG PO TBDP: 4.0000 mg | ORAL_TABLET | Freq: Four times a day (QID) | ORAL | Status: AC | PRN

## 2017-11-07 MED ORDER — CHLORDIAZEPOXIDE HCL 25 MG PO CAPS
25.0000 mg | ORAL_CAPSULE | Freq: Three times a day (TID) | ORAL | Status: AC
  Administered 2017-11-09 – 2017-11-10 (×3): 25 mg via ORAL
  Filled 2017-11-07 (×2): qty 1

## 2017-11-07 MED ORDER — THIAMINE HCL 100 MG/ML IJ SOLN: 100.0000 mg | Freq: Every day | INTRAMUSCULAR | Status: DC

## 2017-11-07 MED ORDER — DICYCLOMINE HCL 20 MG PO TABS: 20.0000 mg | ORAL_TABLET | Freq: Four times a day (QID) | ORAL | Status: AC | PRN

## 2017-11-07 MED ORDER — CHLORDIAZEPOXIDE HCL 25 MG PO CAPS
25.0000 mg | ORAL_CAPSULE | ORAL | Status: AC
  Administered 2017-11-10 – 2017-11-11 (×2): 25 mg via ORAL
  Filled 2017-11-07 (×2): qty 1

## 2017-11-07 MED ORDER — CHLORDIAZEPOXIDE HCL 25 MG PO CAPS
25.0000 mg | ORAL_CAPSULE | Freq: Four times a day (QID) | ORAL | Status: AC
  Administered 2017-11-07 – 2017-11-08 (×5): 25 mg via ORAL
  Filled 2017-11-07 (×6): qty 1

## 2017-11-07 MED ORDER — LORAZEPAM 1 MG PO TABS: 0.0000 mg | ORAL_TABLET | Freq: Two times a day (BID) | ORAL | Status: DC

## 2017-11-07 MED ORDER — VITAMIN B-1 100 MG PO TABS: 100.0000 mg | ORAL_TABLET | Freq: Every day | ORAL | Status: DC

## 2017-11-07 MED ORDER — LORAZEPAM 2 MG/ML IJ SOLN: 0.0000 mg | Freq: Four times a day (QID) | INTRAMUSCULAR | Status: DC

## 2017-11-07 MED ORDER — VITAMIN B-1 100 MG PO TABS
100.0000 mg | ORAL_TABLET | Freq: Every day | ORAL | Status: DC
  Administered 2017-11-08 – 2017-11-13 (×6): 100 mg via ORAL
  Filled 2017-11-07 (×7): qty 1

## 2017-11-07 MED ORDER — METHOCARBAMOL 500 MG PO TABS: 500.0000 mg | ORAL_TABLET | Freq: Three times a day (TID) | ORAL | Status: AC | PRN

## 2017-11-07 MED ORDER — HYDROXYZINE HCL 25 MG PO TABS
25.0000 mg | ORAL_TABLET | ORAL | Status: DC | PRN
  Filled 2017-11-07: qty 1

## 2017-11-07 MED ORDER — LOPERAMIDE HCL 2 MG PO CAPS
2.0000 mg | ORAL_CAPSULE | ORAL | Status: AC | PRN
  Administered 2017-11-10: 2 mg via ORAL
  Filled 2017-11-07: qty 1

## 2017-11-07 MED ORDER — LORAZEPAM 2 MG/ML IJ SOLN: 0.0000 mg | Freq: Two times a day (BID) | INTRAMUSCULAR | Status: DC

## 2017-11-07 NOTE — ED Notes (Signed)
Pt reports he is not currently having any hallucinations and last xanax use was this past Saturday.  ciwa score is zero at this time.  Request medication for mild anxiety,  edp placed orders for hydroxyzine.

## 2017-11-07 NOTE — Progress Notes (Signed)
Nicholas Harrison is a 27 year old male pt admitted on voluntary basis. On admission, he presents as tremulous, diaphoretic and some mild confusion noted. He spoke about how he has been abusing xanax and oxycodone and reports that he needs to get clean. He reports that his last xanax was on Saturday which was 3 days ago. He does report some depression and anxiety but denies SI and is able to contract for safety in the hospital. He was unsure of the month and year, attempted to throw his clothes in the garbage and was unable to tell the time when looking at the clock. He reports that he was in a treatment facility sometime last year for substance abuse and reports that he has been abusing xanax for years. He was able to complete admission process but did have difficulties signing admission paperwork. He was escorted to his room, oriented to the milieu and safety maintained.

## 2017-11-07 NOTE — Tx Team (Signed)
Initial Treatment Plan 11/07/2017 10:43 PM Nicholas BlossomDavid B Eveland ZOX:[id number]    PATIENT STRESSORS: Substance abuse   PATIENT STRENGTHS: Ability for insight Capable of independent living General fund of knowledge   PATIENT IDENTIFIED PROBLEMS: Depression Substance Abuse Suicidal thoughts "I need to get clean"                     DISCHARGE CRITERIA:  Ability to meet basic life and health needs Improved stabilization in mood, thinking, and/or behavior Verbal commitment to aftercare and medication compliance Withdrawal symptoms are absent or subacute and managed without 24-hour nursing intervention  PRELIMINARY DISCHARGE PLAN: Attend aftercare/continuing care group Return to previous living arrangement  PATIENT/FAMILY INVOLVEMENT: This treatment plan has been presented to and reviewed with the patient, Nicholas BlossomDavid B Bryant, and/or family member, .  The patient and family have been given the opportunity to ask questions and make suggestions.  Travis Mastel, [place], [place] 11/07/2017, 10:43 PM

## 2017-11-07 NOTE — ED Triage Notes (Signed)
Pt is hallucinating and has been like this for 2 days. Pt admits to taking drugs on Saturday. Took Zubsolve as well as other substances, but does not know the name of it. Per dad, pt was very sweaty and disoriented yesterday as well.

## 2017-11-07 NOTE — ED Notes (Signed)
Pt states he takes illegal drugs, but cannot verbalize what types of drugs he takes

## 2017-11-07 NOTE — Progress Notes (Signed)
[facility], [facility] Shuvon Rankin, NP is the accepting provider.   Dr. Jama Flavorsobos is the attending provider.   Call report to [phone]   [facility].    Pt is voluntary and can be transported by [facility].  [facility].   Wells GuilesSarah Iraida Cragin, LCSW, LCAS Disposition CSW [facility]/TTS [phone]

## 2017-11-07 NOTE — ED Notes (Signed)
[facility] per [facility] transport [facility], ambualted without any difficulties, no distress noted.

## 2017-11-07 NOTE — Progress Notes (Signed)
Received report and received orders for patient   Pt was medication and given verbal support    Pt is presently safe and adjusting well

## 2017-11-07 NOTE — BH Assessment (Addendum)
Tele Assessment Note   Patient Name: Nicholas BlossomDavid B Murley MRN: [id number] Referring Physician: Pilar PlateBero Location of Patient:  [facility]  Nicholas BlossomDavid B Holsworth is an [age] y.o. male. Pt reports daily use of both xanax and oxycodone, as well as daily use of marijuana.  Pt having significant withdrawal symptoms including visual hallucinations, confusion, and disorientation.  Pt has been in residential treatment twice in past 2 years but only maintained sobriety for less than a month once released.  Pt reports ongoing depression but denies SI/HI.  [facility] outpt [facility] health services but would like residential treatment.    Diagnosis: Major Depressive Disorder, Benzo and opiate use disorder  Past Medical History:  Past Medical History:  Diagnosis Date  . Seizures (HCC)     Past Surgical History:  Procedure Laterality Date  . HAND SURGERY      Family History:  Family History  Problem Relation Age of Onset  . Depression Mother        anxiety  . Diabetes Maternal Grandfather     Social History:  reports that he has never smoked. He has quit using smokeless tobacco.  His smokeless tobacco use included chew. He reports that he has current or past drug history. He reports that he does not drink alcohol.  Additional Social History:  Alcohol / Drug Use Pain Medications: yes Prescriptions: yes-xanax Over the Counter: na History of alcohol / drug use?: Yes Withdrawal Symptoms: Sweats, Fever / Chills Substance #1 Name of Substance 1: xanax 1 - Amount (size/oz): 4-6 mg 1 - Frequency: daily 1 - Duration: 4 months 1 - Last Use / Amount: 8/17 Substance #2 Name of Substance 2: oxycodone 2 - Amount (size/oz): 60-100mg  2 - Frequency: daily 2 - Duration: 4 years 2 - Last Use / Amount: 8/17 Substance #3 Name of Substance 3: Marijuana 3 - Age of First Use: 20 3 - Amount (size/oz): one blunt 3 - Frequency: daily 3 - Duration:  4-5 years 3 - Last Use / Amount: 8/17  CIWA: CIWA-Ar BP: (!) 167/91 Pulse Rate: 74 COWS: Clinical Opiate Withdrawal Scale (COWS) Resting Pulse Rate: Pulse Rate 80 or below Sweating: Subjective report of chills or flushing Restlessness: Able to sit still Pupil Size: Pupils pinned or normal size for room light Bone or Joint Aches: Mild diffuse discomfort Runny Nose or Tearing: Nasal stuffiness or unusually moist eyes GI Upset: No GI symptoms Tremor: No tremor Yawning: No yawning Anxiety or Irritability: Patient reports increasing irritability or anxiousness Gooseflesh Skin: Skin is smooth COWS Total Score: 4  Allergies:  Allergies  Allergen Reactions  . Buspar [Buspirone] Nausea Only    Dizziness (also)    Home Medications:  (Not in a hospital admission)  OB/GYN Status:  No LMP for male patient.  General Assessment Data Is this an Initial Assessment or a Re-assessment for this encounter?: Initial Assessment Marital status: Single Living Arrangements: Alone Can pt return to current living arrangement?: Yes Admission Status: Voluntary Is patient capable of signing voluntary admission?: Yes Referral Source: Self/Family/Friend Insurance type: BCBS     Crisis Care Plan Living Arrangements: Alone Legal Guardian: (na) Name of Psychiatrist: [facility] Name of Therapist: [facility]     Risk to self with the past 6 months Suicidal Ideation: No Has patient been a risk to self within the past 6 months prior to admission? : No Suicidal Intent: No Has patient had any suicidal intent within the past 6 months prior  to admission? : No Is patient at risk for suicide?: No Suicidal Plan?: No Has patient had any suicidal plan within the past 6 months prior to admission? : No Access to Means: No What has been your use of drugs/alcohol within the last 12 months?: current signficant benzo and opiate use, marijuana Previous Attempts/Gestures:  No Intentional Self Injurious Behavior: None Family Suicide History: No Recent stressful life event(s): Other (Comment)(addiction) Persecutory voices/beliefs?: No Depression: Yes Depression Symptoms: Insomnia, Guilt Substance abuse history and/or treatment for substance abuse?: Yes Suicide prevention information given to non-admitted patients: Not applicable  Risk to Others within the past 6 months Homicidal Ideation: No Does patient have any lifetime risk of violence toward others beyond the six months prior to admission? : No Thoughts of Harm to Others: No Current Homicidal Intent: No Current Homicidal Plan: No Access to Homicidal Means: No History of harm to others?: No Assessment of Violence: None Noted Does patient have access to weapons?: Yes (Comment)(several handguns--dad will secure) Criminal Charges Pending?: Yes Describe Pending Criminal Charges: Driving without license Does patient have a court date: Yes Court Date: 11/15/17 Is patient on probation?: No  Psychosis Hallucinations: Visual(earlier today) Delusions: None noted  Mental Status Report Appearance/Hygiene: Unremarkable Eye Contact: Good Motor Activity: Unremarkable Speech: Other (Comment)(thought blocking) Level of Consciousness: Alert Mood: Pleasant Affect: Appropriate to circumstance Anxiety Level: Minimal Thought Processes: Relevant, Thought Blocking Judgement: Unimpaired Orientation: Person, Place, Situation Obsessive Compulsive Thoughts/Behaviors: None  Cognitive Functioning Concentration: Decreased Memory: Recent Impaired, Remote Impaired Is patient IDD: No Is patient DD?: No Insight: Poor Impulse Control: Poor Appetite: Fair Have you had any weight changes? : Loss Amount of the weight change? (lbs): 50 lbs(in past year) Sleep: Decreased Total Hours of Sleep: (unsure) Vegetative Symptoms: None  ADLScreening [facility] Assessment Services) Patient's cognitive ability adequate to safely  complete daily activities?: Yes Patient able to express need for assistance with ADLs?: Yes Independently performs ADLs?: Yes (appropriate for developmental age)  Prior Inpatient Therapy Prior Inpatient Therapy: Yes Prior Therapy Dates: 2018, 2017 [facility]Provider[facility] [facility] Reason for Treatment: residential substance abuse treatment  Prior Outpatient Therapy Prior Outpatient Therapy: Yes Prior Therapy Dates: current [facility] Reason for Treatment: psych/meds Does patient have an ACCT team?: No Does patient have Intensive In-House Services?  : No Does patient have [facility] services? : No Does patient have P4CC services?: No  ADL Screening (condition at time of admission) Patient's cognitive ability adequate to safely complete daily activities?: Yes Patient able to express need for assistance with ADLs?: Yes Independently performs ADLs?: Yes (appropriate for developmental age)       Abuse/Neglect Assessment (Assessment to be complete while patient is alone) Abuse/Neglect Assessment Can Be Completed: Yes Physical Abuse: Denies Verbal Abuse: Denies Sexual Abuse: Denies Exploitation of patient/patient's resources: Denies Self-Neglect: Denies     Merchant navy officerAdvance Directives (For Healthcare) Does Patient Have a Medical Advance Directive?: No Would patient like information on creating a medical advance directive?: No - Patient declined    Additional Information 1:1 In Past 12 Months?: No CIRT Risk: No Elopement Risk: No Does patient have medical clearance?: Yes     Disposition: TTS spoke with Shuvon Rankin, NP, who took part in assessment and recommends inpatient detox at this time. Pt unable to remain safe due to current mental status and requires inpt treatment and further psychiatric evaluation. [facility] Disposition Initial Assessment Completed for this Encounter:  Yes Disposition of Patient: Admit  Type of inpatient treatment program: Adult  This service was provided via telemedicine using a 2-way, interactive audio and video technology.  Names of all persons participating in this telemedicine service and their role in this encounter. Name: Daleen Squibb Role: TTS  Name: Sharyne Richters [facility] Role: patient  Name: Duard Brady Role: father  Name:  Role:     Lorri Frederick 11/07/2017 4:12 PM

## 2017-11-07 NOTE — ED Notes (Signed)
[facility].  Accepting is Shavon Rankin, Np and Attending is Dr. Jama Flavorsobos to rm 300-1.  To be there at 9pm.  Info forwarded to [place] and EDP.

## 2017-11-07 NOTE — ED Provider Notes (Signed)
[facility] Emergency Department Provider Note MRN:  [id number]  Arrival date & time: 11/07/17     Chief Complaint   Hallucinations   History of Present Illness   Nicholas BlossomDavid B Harrison is a 27 y.o. year-old male with a history of polysubstance abuse presenting to the ED with chief complaint of hallucinations.  Both of history obtained from patient's father.  Patient has been abusing opioids and benzodiazepines for some time.  Wants to quit, last used 2 days ago.  Has been increasingly confused since this time, which is abnormal for him.  Patient and patient's family has observed him withdrawal multiple times in the past, and he has never acted like this.  Yesterday was actively hallucinating, wiping imaginary objects off the wall, responding to things that were not really there.  Slept throughout the night, and father thought he would be better today but continues to be confused.  Denies head trauma, no recent fevers, no chest pain or shortness of breath, no abdominal pain, no numbness or weakness in the arms or legs.  No IV drug use, only pills.  Review of Systems  A complete 10 system review of systems was obtained and all systems are negative except as noted in the HPI and PMH.   Patient's Health History    Past Medical History:  Diagnosis Date  . Seizures (HCC)     Past Surgical History:  Procedure Laterality Date  . HAND SURGERY      Family History  Problem Relation Age of Onset  . Depression Mother        anxiety  . Diabetes Maternal Grandfather     Social History   Socioeconomic History  . Marital status: Single    Spouse name: Not on file  . Number of children: Not on file  . Years of education: Not on file  . Highest education level: Not on file  Occupational History  . Not on file  Social Needs  . Financial resource strain: Not on file  . Food insecurity:    Worry: Not on file    Inability: Not on file  . Transportation needs:    Medical: Not  on file    Non-medical: Not on file  Tobacco Use  . Smoking status: Never Smoker  . Smokeless tobacco: Former NeurosurgeonUser    Types: Chew  . Tobacco comment: only experimented  Substance and Sexual Activity  . Alcohol use: No  . Drug use: Yes  . Sexual activity: Not on file  Lifestyle  . Physical activity:    Days per week: Not on file    Minutes per session: Not on file  . Stress: Not on file  Relationships  . Social connections:    Talks on phone: Not on file    Gets together: Not on file    Attends religious service: Not on file    Active member of club or organization: Not on file    Attends meetings of clubs or organizations: Not on file    Relationship status: Not on file  . Intimate partner violence:    Fear of current or ex partner: Not on file    Emotionally abused: Not on file    Physically abused: Not on file    Forced sexual activity: Not on file  Other Topics Concern  . Not on file  Social History Narrative  . Not on file     Physical Exam  Vital Signs and Nursing Notes reviewed Vitals:  11/07/17 1307 11/07/17 1617  BP: (!) 167/91 139/90  Pulse: 74 (!) 59  Resp: 12 19  Temp: 98 F (36.7 C)   SpO2: 100% 100%    CONSTITUTIONAL: Well-appearing, NAD NEURO:  Alert and oriented x 3, no focal deficits, moderately confused, slow thought processing, lack of articulation EYES:  eyes equal and reactive ENT/NECK:  no LAD, no JVD CARDIO: Regular rate, well-perfused, normal S1 and S2 PULM:  CTAB no wheezing or rhonchi GI/GU:  normal bowel sounds, non-distended, non-tender MSK/SPINE:  No gross deformities, no edema SKIN:  no rash, atraumatic PSYCH:  Appropriate speech and behavior  Diagnostic and Interventional Summary    EKG Interpretation  Date/Time:    Ventricular Rate:    PR Interval:    QRS Duration:   QT Interval:    QTC Calculation:   R Axis:     Text Interpretation:        Labs Reviewed  COMPREHENSIVE METABOLIC PANEL - Abnormal; Notable for the  following components:      Result Value   Glucose, Bld 131 (*)    Total Protein 8.4 (*)    Albumin 5.5 (*)    AST 14 (*)    Total Bilirubin 1.7 (*)    All other components within normal limits  ACETAMINOPHEN LEVEL - Abnormal; Notable for the following components:   Acetaminophen (Tylenol), Serum <10 (*)    All other components within normal limits  URINALYSIS, ROUTINE W REFLEX MICROSCOPIC - Abnormal; Notable for the following components:   Ketones, ur 80 (*)    Protein, ur 100 (*)    All other components within normal limits  ETHANOL  CBC WITH DIFFERENTIAL/PLATELET  SALICYLATE LEVEL  RAPID URINE DRUG SCREEN, HOSP PERFORMED    CT HEAD WO CONTRAST  Final Result      Medications  LORazepam (ATIVAN) injection 0-4 mg (has no administration in time range)    Or  LORazepam (ATIVAN) tablet 0-4 mg (has no administration in time range)  LORazepam (ATIVAN) injection 0-4 mg (has no administration in time range)    Or  LORazepam (ATIVAN) tablet 0-4 mg (has no administration in time range)  thiamine (VITAMIN B-1) tablet 100 mg (has no administration in time range)    Or  thiamine (B-1) injection 100 mg (has no administration in time range)  sodium chloride 0.9 % bolus 1,000 mL (0 mLs Intravenous Stopped 11/07/17 1521)     Procedures Critical Care  ED Course and Medical Decision Making  I have reviewed the triage vital signs and the nursing notes.  Pertinent labs & imaging results that were available during my care of the patient were reviewed by me and considered in my medical decision making (see below for details). Clinical Course as of Nov 07 1616  Tue Nov 07, 2017  1339 Hallucinations yesterday, continued altered mental status today in this 27 year old male with history of polysubstance abuse.  Vital signs stable, well-appearing but clearly altered, moderately confused, no focal neurological deficits, denies trauma, denies IV drug use, no fevers.  Most likely polysubstance use  inducing mild psychosis, but will evaluate with labs, CT head today.  Anticipating admission, either to medical floor versus behavioral health.   [MB]  1340 Denies suicidal ideation, currently here voluntarily.  No objective evidence of benzodiazepine withdrawal currently.   [MB]    Clinical Course User Index [MB] Sabas SousBero, Gennifer Potenza M, MD    CT head unremarkable, labs largely unremarkable.  Medically cleared, TTS consulted, recommending inpatient management.  CIWA  ordered, though continues to not exhibit signs of benzo withdrawal.  Awaiting placement.  Elmer Sow. Pilar Plate, MD [facility] Emergency Medicine [facility] [email or web address]  Final Clinical Impressions(s) / ED Diagnoses     ICD-10-CM   1. Polysubstance abuse (HCC) F19.10   2. Hallucinations R44.3   3. Confusion R41.0   4. Opioid withdrawal [facility]) F11.23     ED Discharge Orders    None         Sabas Sous, MD 11/07/17 [phone]

## 2017-11-08 ENCOUNTER — Encounter (HOSPITAL_COMMUNITY): Payer: Self-pay | Admitting: Behavioral Health

## 2017-11-08 DIAGNOSIS — F332 Major depressive disorder, recurrent severe without psychotic features: Principal | ICD-10-CM

## 2017-11-08 MED ORDER — ESCITALOPRAM OXALATE 10 MG PO TABS
10.0000 mg | ORAL_TABLET | Freq: Every day | ORAL | Status: DC
  Administered 2017-11-09 – 2017-11-13 (×5): 10 mg via ORAL
  Filled 2017-11-08 (×6): qty 1

## 2017-11-08 MED ORDER — NICOTINE 21 MG/24HR TD PT24
21.0000 mg | MEDICATED_PATCH | Freq: Every day | TRANSDERMAL | Status: DC
  Administered 2017-11-08 – 2017-11-13 (×6): 21 mg via TRANSDERMAL
  Filled 2017-11-08 (×6): qty 1

## 2017-11-08 NOTE — Tx Team (Addendum)
Interdisciplinary Treatment and Diagnostic Plan Update  11/08/2017 Time of Session: 0830AM Nicholas Harrison MRN: [id number]  Principal Diagnosis: MDD, recurrent, severe  Secondary Diagnoses: Active Problems:   MDD (major depressive disorder), recurrent episode, severe (HCC)   Current Medications:  Current Facility-Administered Medications  Medication Dose Route Frequency Provider Last Rate Last Dose  . chlordiazePOXIDE (LIBRIUM) capsule 25 mg  25 mg Oral Q6H PRN Rankin, Shuvon B, NP      . chlordiazePOXIDE (LIBRIUM) capsule 25 mg  25 mg Oral QID Rankin, Shuvon B, NP   25 mg at 11/08/17 0833   Followed by  . [START ON 11/09/2017] chlordiazePOXIDE (LIBRIUM) capsule 25 mg  25 mg Oral TID Rankin, Shuvon B, NP       Followed by  . [START ON 11/10/2017] chlordiazePOXIDE (LIBRIUM) capsule 25 mg  25 mg Oral BH-qamhs Rankin, Shuvon B, NP       Followed by  . [START ON 11/12/2017] chlordiazePOXIDE (LIBRIUM) capsule 25 mg  25 mg Oral Daily Rankin, Shuvon B, NP      . cloNIDine (CATAPRES) tablet 0.1 mg  0.1 mg Oral QID Lindon Romp A, NP   0.1 mg at 11/08/17 8882   Followed by  . [START ON 11/10/2017] cloNIDine (CATAPRES) tablet 0.1 mg  0.1 mg Oral BH-qamhs Rozetta Nunnery, NP       Followed by  . [START ON 11/13/2017] cloNIDine (CATAPRES) tablet 0.1 mg  0.1 mg Oral QAC breakfast Lindon Romp A, NP      . dicyclomine (BENTYL) tablet 20 mg  20 mg Oral Q6H PRN Lindon Romp A, NP      . hydrOXYzine (ATARAX/VISTARIL) tablet 25 mg  25 mg Oral Q6H PRN Rankin, Shuvon B, NP   25 mg at 11/07/17 2226  . loperamide (IMODIUM) capsule 2-4 mg  2-4 mg Oral PRN Rankin, Shuvon B, NP      . methocarbamol (ROBAXIN) tablet 500 mg  500 mg Oral Q8H PRN Lindon Romp A, NP      . multivitamin with minerals tablet 1 tablet  1 tablet Oral Daily Rankin, Shuvon B, NP   1 tablet at 11/08/17 8003  . naproxen (NAPROSYN) tablet 500 mg  500 mg Oral BID PRN Lindon Romp A, NP      . ondansetron (ZOFRAN-ODT) disintegrating tablet 4 mg   4 mg Oral Q6H PRN Rankin, Shuvon B, NP      . thiamine (VITAMIN B-1) tablet 100 mg  100 mg Oral Daily Rankin, Shuvon B, NP   100 mg at 11/08/17 0833  . traZODone (DESYREL) tablet 50 mg  50 mg Oral QHS,MR X 1 Lindon Romp A, NP   50 mg at 11/07/17 2225   PTA Medications: Medications Prior to Admission  Medication Sig Dispense Refill Last Dose  . ALPRAZolam (XANAX) 1 MG tablet Take 1 tablet by mouth 2 (two) times daily.  0 Past Week at Unknown time  . atomoxetine (STRATTERA) 40 MG capsule Take 1 cap daily for 7 days then increase to 2 caps daily 60 capsule 1 Past Month at Unknown time  . azithromycin (ZITHROMAX) 500 MG tablet Take 2 tablets by mouth once.  0 Completed Course at Unknown time  . cloNIDine (CATAPRES) 0.1 MG tablet Take 1 tablet by mouth 3 (three) times daily.  0 Past Week at Unknown time  . diazepam (VALIUM) 5 MG tablet Take 1 tablet by mouth daily.  0 Past Week at Unknown time  . escitalopram (LEXAPRO) 20 MG tablet Take 1  tablet (20 mg total) by mouth daily. Take in PM 90 tablet 1 Past Week at Unknown time  . ibuprofen (ADVIL,MOTRIN) 800 MG tablet Take 1 tablet (800 mg total) by mouth every 8 (eight) hours as needed. (Patient not taking: Reported on 06/15/2017) 90 tablet 1 unknown  . propranolol (INDERAL) 10 MG tablet Take 1 tablet (10 mg total) by mouth 3 (three) times daily. 90 tablet 0 Past Week at Unknown time  . QUEtiapine (SEROQUEL) 25 MG tablet Take 1-2 tabs at bedtime (Patient taking differently: 100 mg. Take 1-2 tabs at bedtime) 60 tablet 2 Past Week at Unknown time  . ZUBSOLV 11.4-2.9 MG SUBL Take 0.5 tablets by mouth 3 (three) times daily.  0 Past Week at Unknown time    Patient Stressors: Substance abuse  Patient Strengths: Ability for insight Capable of independent living General fund of knowledge  Treatment Modalities: Medication Management, Group therapy, Case management,  1 to 1 session with clinician, Psychoeducation, Recreational therapy.   Physician  Treatment Plan for Primary Diagnosis: MDD, recurrent, severe  Medication Management: Evaluate patient's response, side effects, and tolerance of medication regimen.  Therapeutic Interventions: 1 to 1 sessions, Unit Group sessions and Medication administration.  Evaluation of Outcomes: Not Met  Physician Treatment Plan for Secondary Diagnosis: Active Problems:   MDD (major depressive disorder), recurrent episode, severe (HCC)   Medication Management: Evaluate patient's response, side effects, and tolerance of medication regimen.  Therapeutic Interventions: 1 to 1 sessions, Unit Group sessions and Medication administration.  Evaluation of Outcomes: Not Met   RN Treatment Plan for Primary Diagnosis: MDD, recurrent, severe Long Term Goal(s): Knowledge of disease and therapeutic regimen to maintain health will improve  Short Term Goals: Ability to remain free from injury will improve, Ability to verbalize frustration and anger appropriately will improve, Ability to participate in decision making will improve and Ability to disclose and discuss suicidal ideas  Medication Management: RN will administer medications as ordered by provider, will assess and evaluate patient's response and provide education to patient for prescribed medication. RN will report any adverse and/or side effects to prescribing provider.  Therapeutic Interventions: 1 on 1 counseling sessions, Psychoeducation, Medication administration, Evaluate responses to treatment, Monitor vital signs and CBGs as ordered, Perform/monitor CIWA, COWS, AIMS and Fall Risk screenings as ordered, Perform wound care treatments as ordered.  Evaluation of Outcomes: Not Met   LCSW Treatment Plan for Primary Diagnosis: MDD, recurrent, severe  Long Term Goal(s): Safe transition to appropriate next level of care at discharge, Engage patient in therapeutic group addressing interpersonal concerns.  Short Term Goals: Engage patient in aftercare  planning with referrals and resources, Facilitate acceptance of mental health diagnosis and concerns and Identify triggers associated with mental health/substance abuse issues  Therapeutic Interventions: Assess for all discharge needs, 1 to 1 time with Social worker, Explore available resources and support systems, Assess for adequacy in community support network, Educate family and significant other(s) on suicide prevention, Complete Psychosocial Assessment, Interpersonal group therapy.  Evaluation of Outcomes: Not Met   Progress in Treatment: Attending groups: No. New to unit. Continuing to assess.  Participating in groups: No. Taking medication as prescribed: Yes. Toleration medication: Yes. Family/Significant other contact made: No, will contact:  family member if pt consents to collateral contact.  Patient understands diagnosis: Yes. Discussing patient identified problems/goals with staff: Yes. Medical problems stabilized or resolved: Yes. Denies suicidal/homicidal ideation: Yes. Issues/concerns per patient self-inventory: No. Other: n/a   New problem(s) identified: No, Describe:  n/a  New Short Term/Long Term Goal(s): detox, medication management for mood stabilization; elimination of SI thoughts; development of comprehensive mental wellness/sobriety plan.   Patient Goals:  "I need to get clean and go to residential treatment."   Discharge Plan or Barriers: CSW assessing for appropriate referrals. Pt goes to [facility] for outpatient services but is interested in residential referral. Pt to be referred to [facility] and [facility]. [place] pamphlet, Mobile Crisis information, and AA/NA information provided to patient for additional community support and resources.   Reason for Continuation of Hospitalization: Anxiety Depression Medication stabilization Withdrawal symptoms  Estimated Length of Stay: Monday, 11/13/17  Attendees: Patient:  11/08/2017 8:38 AM  Physician: Dr. Parke Poisson MD; Dr. Nancy Fetter MD 11/08/2017 8:38 AM  Nursing: Rise Paganini RN; Legrand Como RN 11/08/2017 8:38 AM  RN Care Manager:x 11/08/2017 8:38 AM  Social Worker: Janice Norrie LCSW 11/08/2017 8:38 AM  Recreational Therapist: x 11/08/2017 8:38 AM  Other: Lindell Spar NP; Benjamine Mola NP 11/08/2017 8:38 AM  Other:  11/08/2017 8:38 AM  Other: 11/08/2017 8:38 AM    Scribe for Treatment Team: Avelina Laine, LCSW 11/08/2017 8:38 AM

## 2017-11-08 NOTE — Progress Notes (Signed)
Recreation Therapy Notes  Date: 9.21.19 Time: 0930 Location: [address] Dayroom  Group Topic: Stress Management  Goal Area(s) Addresses:  Patient will verbalize importance of using healthy stress management.  Patient will identify positive emotions associated with healthy stress management.   Intervention: Stress Management  Activity : Guided Imagery.  LRT introduced the stress management technique of guided imagery.  LRT read a script that allowed patients to envision being on the beach.  Patients were to follow along as LRT read script.  Education:  Stress Management, Discharge Planning.   Education Outcome: Acknowledges edcuation/In group clarification offered/Needs additional education  Clinical Observations/Feedback: Pt did not attend group.    Caroll RancherMarjette Shreyansh Tiffany, LRT/CTRS         Lillia AbedLindsay, Eura Radabaugh A 11/08/2017 10:56 AM
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
## 2017-11-08 NOTE — Progress Notes (Addendum)
Pt has phone interview with Jesusita Okaan at [facility] in [place] at 2:15PM today. [phone]--[phone]--pt was instructed to call in order to complete phone assessment. Referral also made to [facility], however pt's first choice is [facility].  Iana Buzan S. Alan RipperHolloway, MSW, LCSW Clinical Social Worker 11/08/2017 2:39 PM

## 2017-11-08 NOTE — Progress Notes (Signed)
Patient attended NA group meeting. 

## 2017-11-08 NOTE — H&P (Addendum)
Psychiatric Admission Assessment Adult  Patient Identification: Nicholas Harrison MRN:  [id number] Date of Evaluation:  11/08/2017 Chief Complaint:  mdd  Principal Diagnosis: MDD (major depressive disorder), recurrent severe, without psychosis ([place]) Diagnosis:   Patient Active Problem List   Diagnosis Date Noted  . MDD (major depressive disorder), recurrent severe, without psychosis ([place]) [F33.2] 11/07/2017  . 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 Polysubstance abuse ([place]) [F19.10] 10/19/2016  . Benzodiazepine dependence, episodic ([place]) [F13.20] 10/14/2016  . Moderate tetrahydrocannabinol (THC) dependence ([place]) [F12.20] 05/31/2016  . Opioid use disorder, severe, dependence ([place]) [F11.20] 10/29/2014  . ADD (attention deficit disorder) [F98.8] 02/15/2012  . Well adult exam [Z00.00] 09/02/2011  . Rash [R21] 09/02/2011  . Chronic anxiety [F41.9] 06/05/2011   ID: Nicholas Harrison is a 27 year old male single who lives alone. He has no kids and is currently employed working in [employer].   HPI: Below information from behavioral health assessment has been reviewed by me and I agreed with the findings:Nicholas Harrison is an 27 y.o. male. Pt reports daily use of both xanax and oxycodone, as well as daily use of marijuana.  Pt having significant withdrawal symptoms including visual hallucinations, confusion, and disorientation.  Pt has been in residential treatment twice in past 2 years but only maintained sobriety for less than a month once released.  Pt reports ongoing depression but denies SI/HI. Currently seen at [facility] for outpt mental health services but would like residential treatment.    Evaluation on the unit: Nicholas Harrison is a 27 year old male who was admitted to the unit following transfer from [facility] ED. Patient has a significant substance abuse history and was evaluated in the ED after abusing opioids and benzodiazepines. He reports that  he has been abusing both drugs for the past 4 years. Reports his drug use is daily and states, "every day, whatever I can get my hands on." Patient states he may use 4-6 Xanax/day. Same with pain medications." He also reports daily use of marijuana although denies alcohol use/abuse or other substance abuse issues. Denies IV drug use and reports snorting both drugs.  He reports he has been evaluated at [facility] as well as the ED multiple times for his substance abuse. States he has had severe withdrawal symptoms secondary to his abuse as well as seizures. He reports prior to his admission he recalls feeling confused and experiencing  hallucinations although he was unable to describe the hallucinations. Reports he does recall wiping imaginary objects off the wall. He denies AVH hallucinations at this time.   Patient reports he has been a residential treatment twice ([facility] in [place] 1 year ago; [facility] [place] prior to that admission). He reports his longest sobriety as 1-2 months. He currently receives outpt mental health services and medication management at [facility] and therapy at [facility]. Reports current medications as Xanax, Zubsolv, Seroquel and Lexapro although there are several other medication noted under home medications in the chart. He denies history of physical, sexual or emotional abuse as well as trauma related disorders or PTSD. Denies homicidal ideations. Endorses a history of depression and anxiety and endorse feeling guilty about his drug use.   Patient continues to appear disoriented at this time and he does endorse some withdrawal symptoms. He denies SI, HI and again defies AVH. He does not appear internally preoccupied. He does state he is hopeful for residential treatment following discharge. He is contracting  for safety on the unit.     Associated Signs/Symptoms: Depression Symptoms:  feelings of worthlessness/guilt, anxiety, (Hypo) Manic  Symptoms:  none Anxiety Symptoms:  Excessive Worry, Psychotic Symptoms:  denies at this time PTSD Symptoms: NA Total Time spent with patient: 45 minutes  Past Psychiatric History: Polysubstance abuse, depression, anxiety.  Pt has been in residential treatment twice in past 2 year. Currently seen at [facility] for outpt mental health services and therapy at [facility].   Is the patient at risk to self? Yes.    Has the patient been a risk to self in the past 6 months? Yes.    Has the patient been a risk to self within the distant past? Yes.    Is the patient a risk to others? No.  Has the patient been a risk to others in the past 6 months? No.  Has the patient been a risk to others within the distant past? No.    Alcohol Screening: 1. How often do you have a drink containing alcohol?: Never 2. How many drinks containing alcohol do you have on a typical day when you are drinking?: 1 or 2 3. How often do you have six or more drinks on one occasion?: Never AUDIT-C Score: 0 4. How often during the last year have you found that you were not able to stop drinking once you had started?: Never 5. How often during the last year have you failed to do what was normally expected from you becasue of drinking?: Never 6. How often during the last year have you needed a first drink in the morning to get yourself going after a heavy drinking session?: Never 7. How often during the last year have you had a feeling of guilt of remorse after drinking?: Never 8. How often during the last year have you been unable to remember what happened the night before because you had been drinking?: Never 9. Have you or someone else been injured as a result of your drinking?: No 10. Has a relative or friend or a doctor or another health worker been concerned about your drinking or suggested you cut down?: No Alcohol Use Disorder Identification Test Final Score (AUDIT): 0 Intervention/Follow-up: AUDIT  Score <7 follow-up not indicated Substance Abuse History in the last 12 months:  Yes.   Consequences of Substance Abuse: seizures, withdrawal symtpoms, tremors  Previous Psychotropic Medications: Yes  Psychological Evaluations: No  Past Medical History:  Past Medical History:  Diagnosis Date  . Seizures ([place])     Past Surgical History:  Procedure Laterality Date  . HAND SURGERY     Family History:  Family History  Problem Relation Age of Onset  . Depression Mother        anxiety  . Diabetes Maternal Grandfather    Family Psychiatric  History: Father history of alcohol abuse.  Tobacco Screening: Have you used any form of tobacco in the last 30 days? (Cigarettes, Smokeless Tobacco, Cigars, and/or Pipes): No Social History:  Social History   Substance and Sexual Activity  Alcohol Use No     Social History   Substance and Sexual Activity  Drug Use Yes  . Types: Benzodiazepines, Oxycodone    Additional Social History: Marital status: Single Are you sexually active?: Yes("I was with my girlfriend.") What is your sexual orientation?: Heterosexual  Has your sexual activity been affected by drugs, alcohol, medication, or emotional stress?: Yes Does patient have children?: No  Allergies:   Allergies  Allergen Reactions  . Buspar [Buspirone] Nausea Only    Dizziness (also)   Lab Results:  Results for orders placed or performed during the hospital encounter of 11/07/17 (from the past 48 hour(s))  Comprehensive metabolic panel     Status: Abnormal   Collection Time: 11/07/17  1:48 PM  Result Value Ref Range   Sodium 143 135 - 145 mmol/L   Potassium 4.4 3.5 - 5.1 mmol/L   Chloride 104 98 - 111 mmol/L   CO2 29 22 - 32 mmol/L   Glucose, Bld 131 (H) 70 - 99 mg/dL   BUN 11 6 - 20 mg/dL   Creatinine, Ser 0.65 0.61 - 1.24 mg/dL   Calcium 10.2 8.9 - 10.3 mg/dL   Total Protein 8.4 (H) 6.5 - 8.1 g/dL   Albumin 5.5 (H) 3.5 - 5.0 g/dL   AST 14  (L) 15 - 41 U/L   ALT 12 0 - 44 U/L   Alkaline Phosphatase 61 38 - 126 U/L   Total Bilirubin 1.7 (H) 0.3 - 1.2 mg/dL   GFR calc non Af Amer >60 >60 mL/min   GFR calc Af Amer >60 >60 mL/min    Comment: (NOTE) The eGFR has been calculated using the CKD EPI equation. This calculation has not been validated in all clinical situations. eGFR's persistently <60 mL/min signify possible Chronic Kidney Disease.    Anion gap 10 5 - 15    Comment: Performed at [facility], [address] [place], [place] [address]  CBC with Diff     Status: None   Collection Time: 11/07/17  1:48 PM  Result Value Ref Range   WBC 6.6 4.0 - 10.5 K/uL   RBC 5.16 4.22 - 5.81 MIL/uL   Hemoglobin 15.2 13.0 - 17.0 g/dL   HCT 44.0 39.0 - 52.0 %   MCV 85.3 78.0 - 100.0 fL   MCH 29.5 26.0 - 34.0 pg   MCHC 34.5 30.0 - 36.0 g/dL   RDW 13.9 11.5 - 15.5 %   Platelets 214 150 - 400 K/uL   Neutrophils Relative % 80 %   Neutro Abs 5.2 1.7 - 7.7 K/uL   Lymphocytes Relative 13 %   Lymphs Abs 0.9 0.7 - 4.0 K/uL   Monocytes Relative 7 %   Monocytes Absolute 0.5 0.1 - 1.0 K/uL   Eosinophils Relative 0 %   Eosinophils Absolute 0.0 0.0 - 0.7 K/uL   Basophils Relative 0 %   Basophils Absolute 0.0 0.0 - 0.1 K/uL    Comment: Performed at [facility], [address] [place], [place] [address]  Ethanol     Status: None   Collection Time: 11/07/17  1:57 PM  Result Value Ref Range   Alcohol, Ethyl (B) <10 <10 mg/dL    Comment: (NOTE) Lowest detectable limit for serum alcohol is 10 mg/dL. For medical purposes only. Performed at [facility], [address], [place],  [address]   Acetaminophen level     Status: Abnormal   Collection Time: 11/07/17  1:57 PM  Result Value Ref Range   Acetaminophen (Tylenol), Serum <10 (L) 10 - 30 ug/mL    Comment: (NOTE) Therapeutic concentrations vary significantly. A range of 10-30 ug/mL  may be an effective concentration for many patients. However, some  are best treated at  concentrations outside of this range. Acetaminophen concentrations >150 ug/mL at 4 hours after ingestion  and >50 ug/mL at 12 hours after ingestion are often associated with  toxic reactions. Performed at [facility], [address], [place], [place] [address]   Salicylate level     Status: None   Collection Time: 11/07/17  1:57 PM  Result Value Ref Range   Salicylate Lvl <2.4 2.8 - 30.0 mg/dL    Comment: Performed at [facility], [address], [place], [place] [address]  Urine rapid drug screen (hosp performed)     Status: Abnormal   Collection Time: 11/07/17  4:00 PM  Result Value Ref Range   Opiates NONE DETECTED NONE DETECTED   Cocaine NONE DETECTED NONE DETECTED   Benzodiazepines POSITIVE (A) NONE DETECTED   Amphetamines NONE DETECTED NONE DETECTED   Tetrahydrocannabinol POSITIVE (A) NONE DETECTED   Barbiturates NONE DETECTED NONE DETECTED    Comment: (NOTE) DRUG SCREEN FOR MEDICAL PURPOSES ONLY.  IF CONFIRMATION IS NEEDED FOR ANY PURPOSE, NOTIFY LAB WITHIN 5 DAYS. LOWEST DETECTABLE LIMITS FOR URINE DRUG SCREEN Drug Class                     Cutoff (ng/mL) Amphetamine and metabolites    1000 Barbiturate and metabolites    200 Benzodiazepine                 144 Tricyclics and metabolites     300 Opiates and metabolites        300 Cocaine and metabolites        300 THC                            50 Performed at [facility] [place], [place] [address]   Urinalysis, Routine w reflex microscopic     Status: Abnormal   Collection Time: 11/07/17  4:00 PM  Result Value Ref Range   Color, Urine YELLOW YELLOW   APPearance CLEAR CLEAR   Specific Gravity, Urine 1.027 1.005 - 1.030   pH 8.0 5.0 - 8.0   Glucose, UA NEGATIVE NEGATIVE mg/dL   Hgb urine dipstick NEGATIVE NEGATIVE   Bilirubin Urine NEGATIVE NEGATIVE   Ketones, ur 80 (A) NEGATIVE mg/dL   Protein, ur 100 (A) NEGATIVE mg/dL   Nitrite NEGATIVE NEGATIVE   Leukocytes, UA NEGATIVE NEGATIVE   RBC / HPF  0-5 0 - 5 RBC/hpf   WBC, UA 0-5 0 - 5 WBC/hpf   Bacteria, UA NONE SEEN NONE SEEN   Mucus PRESENT     Comment: Performed at [facility], [address] [place], [place] [address]    Blood Alcohol level:  Lab Results  Component Value Date   ETH <10 11/07/2017   ETH <10 19/50/9326    Metabolic Disorder Labs:  No results found for: HGBA1C, MPG No results found for: PROLACTIN Lab Results  Component Value Date   CHOL 162 08/30/2011   TRIG 328.0 (H) 08/30/2011   HDL 51.00 08/30/2011   CHOLHDL 3 08/30/2011   VLDL 65.6 (H) 08/30/2011    Current Medications: Current Facility-Administered Medications  Medication Dose Route Frequency Provider Last Rate Last Dose  . chlordiazePOXIDE (LIBRIUM) capsule 25 mg  25 mg Oral Q6H PRN Rankin, Shuvon B, NP      . chlordiazePOXIDE (LIBRIUM) capsule 25 mg  25 mg Oral QID Rankin, Shuvon B, NP   25 mg at 11/08/17 0833   Followed by  . [START ON 11/09/2017] chlordiazePOXIDE (LIBRIUM) capsule 25 mg  25 mg Oral TID Rankin, Shuvon B, NP       Followed by  . [  START ON 11/10/2017] chlordiazePOXIDE (LIBRIUM) capsule 25 mg  25 mg Oral BH-qamhs Rankin, Shuvon B, NP       Followed by  . [START ON 11/12/2017] chlordiazePOXIDE (LIBRIUM) capsule 25 mg  25 mg Oral Daily Rankin, Shuvon B, NP      . cloNIDine (CATAPRES) tablet 0.1 mg  0.1 mg Oral QID Lindon Romp A, NP   0.1 mg at 11/08/17 7858   Followed by  . [START ON 11/10/2017] cloNIDine (CATAPRES) tablet 0.1 mg  0.1 mg Oral BH-qamhs Rozetta Nunnery, NP       Followed by  . [START ON 11/13/2017] cloNIDine (CATAPRES) tablet 0.1 mg  0.1 mg Oral QAC breakfast Lindon Romp A, NP      . dicyclomine (BENTYL) tablet 20 mg  20 mg Oral Q6H PRN Lindon Romp A, NP      . hydrOXYzine (ATARAX/VISTARIL) tablet 25 mg  25 mg Oral Q6H PRN Rankin, Shuvon B, NP   25 mg at 11/07/17 2226  . loperamide (IMODIUM) capsule 2-4 mg  2-4 mg Oral PRN Rankin, Shuvon B, NP      . methocarbamol (ROBAXIN) tablet 500 mg  500 mg Oral Q8H PRN Lindon Romp A, NP      . multivitamin with minerals tablet 1 tablet  1 tablet Oral Daily Rankin, Shuvon B, NP   1 tablet at 11/08/17 8502  . naproxen (NAPROSYN) tablet 500 mg  500 mg Oral BID PRN Lindon Romp A, NP      . ondansetron (ZOFRAN-ODT) disintegrating tablet 4 mg  4 mg Oral Q6H PRN Rankin, Shuvon B, NP      . thiamine (VITAMIN B-1) tablet 100 mg  100 mg Oral Daily Rankin, Shuvon B, NP   100 mg at 11/08/17 0833  . traZODone (DESYREL) tablet 50 mg  50 mg Oral QHS,MR X 1 Lindon Romp A, NP   50 mg at 11/07/17 2225   PTA Medications: Medications Prior to Admission  Medication Sig Dispense Refill Last Dose  . ALPRAZolam (XANAX) 1 MG tablet Take 1 tablet by mouth 2 (two) times daily.  0 Past Week at Unknown time  . atomoxetine (STRATTERA) 40 MG capsule Take 1 cap daily for 7 days then increase to 2 caps daily 60 capsule 1 Past Month at Unknown time  . azithromycin (ZITHROMAX) 500 MG tablet Take 2 tablets by mouth once.  0 Completed Course at Unknown time  . cloNIDine (CATAPRES) 0.1 MG tablet Take 1 tablet by mouth 3 (three) times daily.  0 Past Week at Unknown time  . diazepam (VALIUM) 5 MG tablet Take 1 tablet by mouth daily.  0 Past Week at Unknown time  . escitalopram (LEXAPRO) 20 MG tablet Take 1 tablet (20 mg total) by mouth daily. Take in PM 90 tablet 1 Past Week at Unknown time  . ibuprofen (ADVIL,MOTRIN) 800 MG tablet Take 1 tablet (800 mg total) by mouth every 8 (eight) hours as needed. (Patient not taking: Reported on 06/15/2017) 90 tablet 1 unknown  . propranolol (INDERAL) 10 MG tablet Take 1 tablet (10 mg total) by mouth 3 (three) times daily. 90 tablet 0 Past Week at Unknown time  . QUEtiapine (SEROQUEL) 25 MG tablet Take 1-2 tabs at bedtime (Patient taking differently: 100 mg. Take 1-2 tabs at bedtime) 60 tablet 2 Past Week at Unknown time  . ZUBSOLV 11.4-2.9 MG SUBL Take 0.5 tablets by mouth 3 (three) times daily.  0 Past Week at Unknown time    Musculoskeletal: Strength &  Muscle  Tone: within normal limits Gait & Station: normal Patient leans: N/A  Psychiatric Specialty Exam: Physical Exam  Nursing note and vitals reviewed. Constitutional: He is oriented to person, place, and time.  Neurological: He is alert and oriented to person, place, and time.    Review of Systems  Psychiatric/Behavioral: Positive for depression and substance abuse. Negative for hallucinations, memory loss and suicidal ideas. The patient is nervous/anxious and has insomnia.   All other systems reviewed and are negative.   Blood pressure (!) 153/94, pulse 66, temperature 98.5 F (36.9 C), temperature source Oral, resp. rate 16, height 5' 8.5" (1.74 m), weight 70.8 kg.Body mass index is 23.37 kg/m.  General Appearance: Casual  Eye Contact:  Good  Speech:  Clear and Coherent and Normal Rate  Volume:  Normal  Mood:  Anxious  Affect:  Congruent  Thought Process:  Coherent, Goal Directed, Linear and Descriptions of Associations: Intact  Orientation:  Full (Time, Place, and Person)  Thought Content:  Logical  Suicidal Thoughts:  No  Homicidal Thoughts:  No  Memory:  Immediate;   Fair Recent;   Fair  Judgement:  Impaired  Insight:  Fair  Psychomotor Activity:  Normal  Concentration:  Concentration: Fair and Attention Span: Fair  Recall:  [employer] of Knowledge:  Fair  Language:  Good  Akathisia:  Negative  Handed:  Right  AIMS (if indicated):     Assets:  Communication Skills Desire for Improvement Resilience Social Support  ADL's:  Intact  Cognition:  WNL  Sleep:       Treatment Plan Summary: Daily contact with patient to assess and evaluate symptoms and progress in treatment  Treatment Plan/Recommendations: 1. Admit for crisis management and stabilization, estimated length of stay 3-5 days.  2. Medication management to reduce current symptoms to base line and improve the patient's overall level of functioning: See Md's SRATreatment plan.? 3. Treat health problems as  indicated.  4. Develop treatment plan to decrease risk of relapse upon discharge and the need for readmission.  5. Psycho-social education regarding relapse prevention and self care.  6. Health care follow up as needed for medical problems.  7. Review, reconcile, and reinstate any pertinent home medications for other health issues where appropriate. 8. Call for consults with hospitalist for any additional specialty patient care services as needed. 9. Begin Clonidine detox protocol for withdrawal symptoms. 10. Labs: UDS positive for benzos and THC. Ethanol negative. Ordered TSH, HgbA1c and lipid panel.    Physician Treatment Plan for Primary Diagnosis: MDD (major depressive disorder), recurrent severe, without psychosis ([place]) Long Term Goal(s): Improvement in symptoms so as ready for discharge  Short Term Goals: Ability to verbalize feelings will improve, Ability to identify and develop effective coping behaviors will improve, Compliance with prescribed medications will improve and Ability to identify triggers associated with substance abuse/mental health issues will improve  Physician Treatment Plan for Secondary Diagnosis: Principal Problem:   MDD (major depressive disorder), recurrent severe, without psychosis ([place]) Active Problems:   Opioid use disorder, severe, dependence ([place])   Benzodiazepine dependence, episodic ([place])  Long Term Goal(s): Improvement in symptoms so as ready for discharge  Short Term Goals: Ability to verbalize feelings will improve, Ability to demonstrate self-control will improve and Ability to identify and develop effective coping behaviors will improve  I certify that inpatient services furnished can reasonably be expected to improve the patient's condition.    Mordecai Maes, NP 8/21/201910:45 AM   I have discussed case with  NP and have met with patient  Agree with NP note and assessment  27, single, no children, lives alone, employed.   Presented to the  hospital with family requesting help with substance abuse . Family reported that patient appeared to be in withdrawal, hallucinating, confused prior to admission.  Patient reports he last took Xanax about 3-4  days prior.  History of substance abuse- reports he was abusing benzodiazepines ( Alprazolam, up to 6 mgrs daily) and opiates. Reports he had been abusing opiate analgesics and more recently had been prescribed Buprenorphine , which he states was to gradually " detox off the pills".  Reports he was taking more Buprenorphine than prescribed and sometimes " trading them". Patient reports " my Xanax addiction has gotten worse" over recent months.  Admission UDS positive for BZDs and for Cannabis, negative for opiates . States last took Buprenorphine 3 days ago.  Reports history of depression, anxiety. Has never attempted suicide . Reports history of panic attacks, and some social phobia symptoms. States he has been on Lexapro.  History of substance use disorder- describes opiate and benzodiazepines as substances of choice . Denies IVDA. Denies alcohol abuse . States he has had WDL seizures x 2 when discontinuing BZD in the past .  Denies medical illnesses, states allergic to Buspirone- reports vomiting.   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  Plan - inpatient admission Based on history of severe BZD WDL, including hallucinations, seizures in the past, prefer standing detox protocol over PRN He is currently on standing Librium detox protocol and  Opiate detox protocol. Restart Lexapro 10 mgrs QDAY.

## 2017-11-08 NOTE — [facility] Suicide Risk Assessment (Signed)
[facility]   Nursing information obtained from:  Patient Demographic factors:  Male, Adolescent or young adult, Caucasian, Living alone Current Mental Status:  NA Loss Factors:  NA Historical Factors:  Family history of mental illness or substance abuse Risk Reduction Factors:  Positive coping skills or problem solving skills  Total Time spent with patient: 45 minutes Principal Problem: MDD (major depressive disorder), recurrent severe, without psychosis (HCC) Diagnosis:   Patient Active Problem List   Diagnosis Date Noted  . MDD (major depressive disorder), recurrent severe, without psychosis (HCC) [F33.2] 11/07/2017  . 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 Polysubstance abuse (HCC) [F19.10] 10/19/2016  . 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 Moderate tetrahydrocannabinol (THC) dependence (HCC) [F12.20] 05/31/2016  . Opioid use disorder, severe, dependence (HCC) [F11.20] 10/29/2014  . ADD (attention deficit disorder) [F98.8] 02/15/2012  . Well adult exam [Z00.00] 09/02/2011  . Rash [R21] 09/02/2011  . Chronic anxiety [F41.9] 06/05/2011   Subjective Data:   Continued Clinical Symptoms:  Alcohol Use Disorder Identification Test Final Score (AUDIT): 0 The "Alcohol Use Disorders Identification Test", Guidelines for Use in Primary Care, Second Edition.  World [employer] writer[employer] [facility](WHO). Score between 0-7:  no or low risk or alcohol related problems. Score between 8-15:  moderate risk of alcohol related problems. Score between 16-19:  high risk of alcohol related problems. Score 20 or above:  warrants further diagnostic evaluation for alcohol dependence and treatment.   CLINICAL FACTORS:  27, single, no children, lives alone, employed.   Presented to the hospital with family requesting help with substance abuse . Family reported that patient appeared to be in withdrawal,  hallucinating, confused prior to admission.  Patient reports he last took Xanax about 3-4  days prior.  History of substance abuse- reports he was abusing benzodiazepines ( Alprazolam, up to 6 mgrs daily) and opiates. Reports he had been abusing opiate analgesics and more recently had been prescribed Buprenorphine , which he states was to gradually " detox off the pills".  Reports he was taking more Buprenorphine than prescribed and sometimes " trading them". Patient reports " my Xanax addiction has gotten worse" over recent months.  Admission UDS positive for BZDs and for Cannabis, negative for opiates . States last took Buprenorphine 3 days ago.  Reports history of depression, anxiety. Has never attempted suicide . Reports history of panic attacks, and some social phobia symptoms. States he has been on Lexapro.  History of substance use disorder- describes opiate and benzodiazepines as substances of choice . Denies IVDA. Denies alcohol abuse . States he has had WDL seizures x 2 when discontinuing BZD in the past .  Denies medical illnesses, states allergic to Buspirone- reports vomiting.   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  Plan - inpatient admission Based on history of severe BZD WDL, including hallucinations, seizures in the past, prefer standing detox protocol over PRN He is currently on standing Librium detox protocol and  Opiate detox protocol. Restart Lexapro 10 mgrs QDAY.  Musculoskeletal: Strength & Muscle Tone: within normal limits Gait & Station: normal Patient leans: N/A  Psychiatric Specialty Exam: Physical Exam  ROS mild headache, no visual disturbances, no chest pain, no dyspnea, no diaphoresis, no rash, endorses mild nausea, (+) diarrhea, no vomiting   Blood pressure 133/81, pulse 97, temperature 98.3 F (36.8 C), temperature source Oral, resp. rate 16, height 5' 8.5" (1.74 m),  weight 70.8 kg.Body mass index  is 23.37 kg/m.  General Appearance: Well Groomed  Eye Contact:  Good  Speech:  Normal Rate  Volume:  Normal  Mood:  reports mood is improving , states he feels better   Affect:  Appropriate and reactive, vaguely anxious  Thought Process:  Linear and Descriptions of Associations: Intact  Orientation:  Other:  fully alert and attentive  Thought Content:  no hallucinations, no delusions   Suicidal Thoughts:  No denies suicidal or self injurious ideations, no homicidal or violent ideations  Homicidal Thoughts:  No  Memory:  recent and remote grossly intact   Judgement:  Fair  Insight:  Fair  Psychomotor Activity:  not currently in any acute distress, no psychomotor agitation or restlessness - no distal tremors or diaphoresis noted   Concentration:  Concentration: Good and Attention Span: Good  Recall:  Good  Fund of Knowledge:  Good  Language:  Good  Akathisia:  Negative  Handed:  Right  AIMS (if indicated):     Assets:  Desire for Improvement Resilience  ADL's:  Intact  Cognition:  WNL  Sleep:         COGNITIVE FEATURES THAT CONTRIBUTE TO RISK:  Closed-mindedness and Loss of executive function    SUICIDE RISK:   Moderate:  Frequent suicidal ideation with limited intensity, and duration, some specificity in terms of plans, no associated intent, good self-control, limited dysphoria/symptomatology, some risk factors present, and identifiable protective factors, including available and accessible social support.  PLAN OF CARE: Patient will be admitted to inpatient psychiatric unit for stabilization and safety. Will provide and encourage milieu participation. Provide medication management and maked adjustments as needed. Will also provide medication management to address risk of WDL symptoms. Will follow daily.    I certify that inpatient services furnished can reasonably be expected to improve the patient's condition.   Craige CottaFernando A Rami Budhu, MD 11/08/2017, 1:41 PM

## 2017-11-08 NOTE — [facility] Group Notes (Signed)
LCSW Group Therapy Note  11/08/2017 1:15pm  Type of Therapy/Topic:  Group Therapy:  Balance in Life  Participation Level:  Did Not Attend--pt meeting with provider during group. Did not attend.   Description of Group:    This group will address the concept of balance and how it feels and looks when one is unbalanced. Patients will be encouraged to process areas in their lives that are out of balance and identify reasons for remaining unbalanced. Facilitators will guide patients in utilizing problem-solving interventions to address and correct the stressor making their life unbalanced. Understanding and applying boundaries will be explored and addressed for obtaining and maintaining a balanced life. Patients will be encouraged to explore ways to assertively make their unbalanced needs known to significant others in their lives, using other group members and facilitator for support and feedback.  Therapeutic Goals: 1. Patient will identify two or more emotions or situations they have that consume much of in their lives. 2. Patient will identify signs/triggers that life has become out of balance:  3. Patient will identify two ways to set boundaries in order to achieve balance in their lives:  4. Patient will demonstrate ability to communicate their needs through discussion and/or role plays  Summary of Patient Progress:   x   Therapeutic Modalities:   Cognitive Behavioral Therapy Solution-Focused Therapy Assertiveness Training  Rona RavensHeather S Sabine Tenenbaum, KentuckyLCSW 11/08/2017 3:53 PM

## 2017-11-09 ENCOUNTER — Encounter (HOSPITAL_COMMUNITY): Payer: Self-pay | Admitting: Behavioral Health

## 2017-11-09 DIAGNOSIS — Z818 Family history of other mental and behavioral disorders: Secondary | ICD-10-CM

## 2017-11-09 DIAGNOSIS — Z811 Family history of alcohol abuse and dependence: Secondary | ICD-10-CM

## 2017-11-09 DIAGNOSIS — F419 Anxiety disorder, unspecified: Secondary | ICD-10-CM

## 2017-11-09 DIAGNOSIS — G4700 Insomnia, unspecified: Secondary | ICD-10-CM

## 2017-11-09 LAB — LIPID PANEL
CHOLESTEROL: 152 mg/dL (ref 0–200)
HDL: 36 mg/dL — ABNORMAL LOW (ref >40)
LDL CALC: 100 mg/dL — AB (ref 0–99)
TRIGLYCERIDES: 79 mg/dL (ref <150)
Total CHOL/HDL Ratio: 4.2 RATIO
VLDL: 16 mg/dL (ref 0–40)

## 2017-11-09 LAB — HEMOGLOBIN A1C
HEMOGLOBIN A1C: 4.7 % — AB (ref 4.8–5.6)
Mean Plasma Glucose: 88.19 mg/dL

## 2017-11-09 LAB — TSH: TSH: 4.489 u[IU]/mL (ref 0.350–4.500)

## 2017-11-09 MED ORDER — ACETAMINOPHEN 325 MG PO TABS
ORAL_TABLET | ORAL | Status: AC
  Filled 2017-11-09: qty 2

## 2017-11-09 MED ORDER — ACETAMINOPHEN 325 MG PO TABS
650.0000 mg | ORAL_TABLET | Freq: Four times a day (QID) | ORAL | Status: DC | PRN
  Administered 2017-11-09 – 2017-11-12 (×3): 650 mg via ORAL
  Filled 2017-11-09 (×2): qty 2

## 2017-11-09 MED ORDER — QUETIAPINE FUMARATE 50 MG PO TABS
50.0000 mg | ORAL_TABLET | Freq: Every day | ORAL | Status: DC
  Administered 2017-11-09 – 2017-11-10 (×2): 50 mg via ORAL
  Filled 2017-11-09 (×3): qty 1

## 2017-11-09 NOTE — [facility] Group Notes (Signed)
LCSW Group Therapy Note   11/09/2017 1:15pm   Type of Therapy and Topic:  Group Therapy:  Overcoming Obstacles   Participation Level:  Did Not Attend--pt invited. Chose to remain in bed. Pt reports that he did not get sleep last night.    Description of Group:    In this group patients will be encouraged to explore what they see as obstacles to their own wellness and recovery. They will be guided to discuss their thoughts, feelings, and behaviors related to these obstacles. The group will process together ways to cope with barriers, with attention given to specific choices patients can make. Each patient will be challenged to identify changes they are motivated to make in order to overcome their obstacles. This group will be process-oriented, with patients participating in exploration of their own experiences as well as giving and receiving support and challenge from other group members.   Therapeutic Goals: 1. Patient will identify personal and current obstacles as they relate to admission. 2. Patient will identify barriers that currently interfere with their wellness or overcoming obstacles.  3. Patient will identify feelings, thought process and behaviors related to these barriers. 4. Patient will identify two changes they are willing to make to overcome these obstacles:      Summary of Patient Progress   x   Therapeutic Modalities:   Cognitive Behavioral Therapy Solution Focused Therapy Motivational Interviewing Relapse Prevention Therapy  Rona RavensHeather S Kalilah Barua, LCSW 11/09/2017 12:59 PM

## 2017-11-09 NOTE — Progress Notes (Signed)
7a-7p Shift:  D:  Pt complained of not sleeping well last night, and slept late this morning.  He complained of a headache twice today but no other somatic complaints.  He is observed interacting appropriately with his peers.   A:  Support, education, and encouragement provided as appropriate to situation.  Medications administered per MD order.  Level 3 checks continued for safety.   R:  Pt receptive to measures; Safety maintained.

## 2017-11-09 NOTE — Progress Notes (Addendum)
CS[facility]he requested clinical information to be faxed to: [phone] and states that he can accept patient pending review of this information. Pt has been instructed to contact Jesusita OkaDan today to discuss travel information and payment for flight.  Kaid Seeberger S. Alan RipperHolloway, MSW, LCSW Clinical Social Worker 11/09/2017 2:18 PM

## 2017-11-09 NOTE — [facility] Suicide Risk Assessment (Signed)
[facility] INPATIENT:  Family/Significant Other Suicide Prevention Education  Suicide Prevention Education:  Education Completed; Duard BradyDavid Sorrels (pt's father) [phone] has been identified by the patient as the family member/significant other with whom the patient will be residing, and identified as the person(s) who will aid the patient in the event of a mental health crisis (suicidal ideations/suicide attempt).  With written consent from the patient, the family member/significant other has been provided the following suicide prevention education, prior to the and/or following the discharge of the patient.  The suicide prevention education provided includes the following:  Suicide risk factors  Suicide prevention and interventions  National Suicide Hotline telephone number  [facility] assessment telephone number  [facility] Assistance 911  [facility] and/or [facility]idential Mobile Crisis [facility] telephone number  Request made of family/significant other to:  Remove weapons (e.g., guns, rifles, knives), all items previously/currently identified as safety concern.    Remove drugs/medications (over-the-counter, prescriptions, illicit drugs), all items previously/currently identified as a safety concern.  The family member/significant other verbalizes understanding of the suicide prevention education information provided.  The family member/significant other agrees to remove the items of safety concern listed above.  Rona RavensHeather S Kveon Casanas LCSW 11/09/2017, 2:28 PM

## 2017-11-09 NOTE — Progress Notes (Signed)
Psychoeducational Group Note  Date:  11/09/2017 Time:  2307  Group Topic/Focus:  Wrap-Up Group:   The focus of this group is to help patients review their daily goal of treatment and discuss progress on daily workbooks.  Participation Level: Did Not Attend  Participation Quality:  Not Applicable  Affect:  Not Applicable  Cognitive:  Not Applicable  Insight:  Not Applicable  Engagement in Group: Not Applicable  Additional Comments:  The patient did not attend group as he remained in his bedroom.   Hazle CocaGOODMAN, Talia Hoheisel S 11/09/2017, 11:07 PM

## 2017-11-09 NOTE — Progress Notes (Signed)
D    Pt reports feeling some better but still having some withdrawal symptoms   His behavior is appropriate and he interacts well with others    Pt said the AA meeting tonight was helpful  A    Verbal support given    Medications administered and effectiveness monitored   Q 15 min checks   Monitor for withdrawal symptoms and educate on same R  Pt remains safe and receptive to verbal support

## 2017-11-09 NOTE — Progress Notes (Addendum)
[facility] MD Progress Note  11/09/2017 9:27 AM Nicholas Harrison  MRN:  [id number]  Subjective: " I would feel much better if I was able to get some sleep. My sleep was terrible last night."   Evaluation on the unit: Face to face evaluation completed, case discussed with treatment team and chart reviewed. Tome is a 27 year old male who was admitted to the unit following transfer from [facility] ED. Patient has a significant substance abuse history and was evaluated in the ED after abusing opioids and benzodiazepines. Prior to his admission, he presented to the ED with significant withdrawal symptoms including visual hallucinations, confusion, and disorientation. During the initial assessment on the unit, patient continued to appear disoriented and he continued to endorse withdrawal symptoms although he denies AVH or other psychosis.   On assessment patient is alert and oriented x4, calm and cooperative.  His mood is depressed and his affect is congruent. He rates current level of depression as 4/10 with 10 being the most severe. He will start Lexapro 10 mg po daily for depression management. He endorses he continues to have some withdrawal symptoms (headache mostly) and he remains of Librium detox protocol. He has a significant substance abuse history and has had in the past, severe BZD WDL, including hallucinations, seizures. No seizure activity has been observed or reported thus far during his hospital course. He denies SI, HI or AVH and does not appear internally preoccupied. He endorses poor sleeping pattern following two days on Trazadone. He reports using Seroquel in the past which was effective in managing his sleep. He denies concerns with appetite.Denies somatic complaints. He is attending and participation in unit activities including  AA meeting He had an interview yesterday with treatment facility in [place] and he is waiting to here back from them. He remains open to outpatient treatment for his substance  abuse. At this time, he is contracting for safety on the unit.     Principal Problem: MDD (major depressive disorder), recurrent severe, without psychosis (HCC) Diagnosis:   Patient Active Problem List   Diagnosis Date Noted  . MDD (major depressive disorder), recurrent severe, without psychosis (HCC) [F33.2] 11/07/2017  . 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 Polysubstance abuse (HCC) [F19.10] 10/19/2016  . 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 Moderate tetrahydrocannabinol (THC) dependence (HCC) [F12.20] 05/31/2016  . Opioid use disorder, severe, dependence (HCC) [F11.20] 10/29/2014  . ADD (attention deficit disorder) [F98.8] 02/15/2012  . Well adult exam [Z00.00] 09/02/2011  . Rash [R21] 09/02/2011  . Chronic anxiety [F41.9] 06/05/2011   Total Time spent with patient: 20 minutes  Past Psychiatric History: Polysubstance abuse, depression, anxiety. Pt has been in residential treatment twice in past 2 year. [facility] for outpt mental healthservices and therapy at [employer].  Past Medical History:  Past Medical History:  Diagnosis Date  . Seizures (HCC)     Past Surgical History:  Procedure Laterality Date  . HAND SURGERY     Family History:  Family History  Problem Relation Age of Onset  . Depression Mother        anxiety  . Diabetes Maternal Grandfather    Family Psychiatric  History: Father history of alcohol abuse. Social History:  Social History   Substance and Sexual Activity  Alcohol Use No     Social History   Substance and Sexual Activity  Drug Use Yes  . Types: Benzodiazepines, Oxycodone  Social History   Socioeconomic History  . Marital status: Single    Spouse name: Not on file  . Number of children: Not on file  . Years of education: Not on file  . Highest education level: Not on file  Occupational History  . Not on  file  Social Needs  . Financial resource strain: Not on file  . Food insecurity:    Worry: Not on file    Inability: Not on file  . Transportation needs:    Medical: Not on file    Non-medical: Not on file  Tobacco Use  . Smoking status: Never Smoker  . Smokeless tobacco: Former Neurosurgeon    Types: Chew  . Tobacco comment: only experimented  Substance and Sexual Activity  . Alcohol use: No  . Drug use: Yes    Types: Benzodiazepines, Oxycodone  . Sexual activity: Not Currently  Lifestyle  . Physical activity:    Days per week: Not on file    Minutes per session: Not on file  . Stress: Not on file  Relationships  . Social connections:    Talks on phone: Not on file    Gets together: Not on file    Attends religious service: Not on file    Active member of club or organization: Not on file    Attends meetings of clubs or organizations: Not on file    Relationship status: Not on file  Other Topics Concern  . Not on file  Social History Narrative  . Not on file   Additional Social History:                         Sleep: Poor  Appetite:  Fair  Current Medications: Current Facility-Administered Medications  Medication Dose Route Frequency Provider Last Rate Last Dose  . chlordiazePOXIDE (LIBRIUM) capsule 25 mg  25 mg Oral Q6H PRN Rankin, Shuvon B, NP      . chlordiazePOXIDE (LIBRIUM) capsule 25 mg  25 mg Oral QID Rankin, Shuvon B, NP   25 mg at 11/08/17 2137   Followed by  . chlordiazePOXIDE (LIBRIUM) capsule 25 mg  25 mg Oral TID Rankin, Shuvon B, NP       Followed by  . [START ON 11/10/2017] chlordiazePOXIDE (LIBRIUM) capsule 25 mg  25 mg Oral BH-qamhs Rankin, Shuvon B, NP       Followed by  . [START ON 11/12/2017] chlordiazePOXIDE (LIBRIUM) capsule 25 mg  25 mg Oral Daily Rankin, Shuvon B, NP      . cloNIDine (CATAPRES) tablet 0.1 mg  0.1 mg Oral QID Nira Conn A, NP   0.1 mg at 11/08/17 2137   Followed by  . [START ON 11/10/2017] cloNIDine (CATAPRES) tablet  0.1 mg  0.1 mg Oral BH-qamhs Jackelyn Poling, NP       Followed by  . [START ON 11/13/2017] cloNIDine (CATAPRES) tablet 0.1 mg  0.1 mg Oral QAC breakfast Nira Conn A, NP      . dicyclomine (BENTYL) tablet 20 mg  20 mg Oral Q6H PRN Nira Conn A, NP      . escitalopram (LEXAPRO) tablet 10 mg  10 mg Oral Daily Rishav Rockefeller A, MD      . hydrOXYzine (ATARAX/VISTARIL) tablet 25 mg  25 mg Oral Q6H PRN Rankin, Shuvon B, NP   25 mg at 11/07/17 2226  . loperamide (IMODIUM) capsule 2-4 mg  2-4 mg Oral PRN Rankin, Shuvon B, NP      .  methocarbamol (ROBAXIN) tablet 500 mg  500 mg Oral Q8H PRN Nira ConnBerry, Jason A, NP      . multivitamin with minerals tablet 1 tablet  1 tablet Oral Daily Rankin, Shuvon B, NP   1 tablet at 11/08/17 0833  . naproxen (NAPROSYN) tablet 500 mg  500 mg Oral BID PRN Nira ConnBerry, Jason A, NP   500 mg at 11/08/17 2137  . nicotine (NICODERM CQ - dosed in mg/24 hours) patch 21 mg  21 mg Transdermal Daily Chalise Pe, Rockey SituFernando A, MD   21 mg at 11/08/17 1405  . ondansetron (ZOFRAN-ODT) disintegrating tablet 4 mg  4 mg Oral Q6H PRN Rankin, Shuvon B, NP      . thiamine (VITAMIN B-1) tablet 100 mg  100 mg Oral Daily Rankin, Shuvon B, NP   100 mg at 11/08/17 0833  . traZODone (DESYREL) tablet 50 mg  50 mg Oral QHS,MR X 1 Nira ConnBerry, Jason A, NP   50 mg at 11/08/17 2137    Lab Results:  Results for orders placed or performed during the hospital encounter of 11/07/17 (from the past 48 hour(s))  Lipid panel     Status: Abnormal   Collection Time: 11/09/17  6:26 AM  Result Value Ref Range   Cholesterol 152 0 - 200 mg/dL   Triglycerides 79 <782<150 mg/dL   HDL 36 (L) >95>40 mg/dL   Total CHOL/HDL Ratio 4.2 RATIO   VLDL 16 0 - 40 mg/dL   LDL Cholesterol 621100 (H) 0 - 99 mg/dL    Comment:        Total Cholesterol/HDL:CHD Risk Coronary Heart Disease Risk Table                     Men   Women  1/2 Average Risk   3.4   3.3  Average Risk       5.0   4.4  2 X Average Risk   9.6   7.1  3 X Average Risk  23.4   11.0         Use the calculated Patient Ratio above and the CHD Risk Table to determine the patient's CHD Risk.        ATP III CLASSIFICATION (LDL):  <100     mg/dL   Optimal  [id number]  mg/dL   Near or Above                    Optimal  130-159  mg/dL   Borderline  [id number]  mg/dL   High  >952>190     mg/dL   Very High Performed at [facility], [address] [place], [place] [address]     Blood Alcohol level:  Lab Results  Component Value Date   ETH <10 11/07/2017   ETH <10 06/15/2017    Metabolic Disorder Labs: No results found for: HGBA1C, MPG No results found for: PROLACTIN Lab Results  Component Value Date   CHOL 152 11/09/2017   TRIG 79 11/09/2017   HDL 36 (L) 11/09/2017   CHOLHDL 4.2 11/09/2017   VLDL 16 11/09/2017   LDLCALC 100 (H) 11/09/2017    Physical Findings: AIMS: Facial and Oral Movements Muscles of Facial Expression: None, normal Lips and Perioral Area: None, normal Jaw: None, normal Tongue: None, normal,Extremity Movements Upper (arms, wrists, hands, fingers): None, normal Lower (legs, knees, ankles, toes): None, normal, Trunk Movements Neck, shoulders, hips: None, normal, Overall Severity Severity of abnormal movements (highest score from questions above): None, normal  Incapacitation due to abnormal movements: None, normal Patient's awareness of abnormal movements (rate only patient's report): No Awareness, Dental Status Current problems with teeth and/or dentures?: No Does patient usually wear dentures?: No  CIWA:  CIWA-Ar Total: 8 COWS:  COWS Total Score: 5  Musculoskeletal: Strength & Muscle Tone: within normal limits Gait & Station: normal Patient leans: N/A  Psychiatric Specialty Exam: Physical Exam  Nursing note and vitals reviewed. Constitutional: He is oriented to person, place, and time.  Neurological: He is alert and oriented to person, place, and time.    Review of Systems  Psychiatric/Behavioral: Positive for  depression and substance abuse. Negative for hallucinations, memory loss and suicidal ideas. The patient is nervous/anxious and has insomnia.   All other systems reviewed and are negative.   Blood pressure 109/73, pulse 78, temperature 98.3 F (36.8 C), temperature source Oral, resp. rate 16, height 5' 8.5" (1.74 m), weight 70.8 kg.Body mass index is 23.37 kg/m.  General Appearance: Fairly Groomed  Eye Contact:  Good  Speech:  Clear and Coherent and Normal Rate  Volume:  Normal  Mood:  Depressed  Affect:  Congruent  Thought Process:  Coherent, Goal Directed, Linear and Descriptions of Associations: Intact  Orientation:  Full (Time, Place, and Person)  Thought Content:  Logical  Suicidal Thoughts:  No  Homicidal Thoughts:  No  Memory:  Immediate;   Fair Recent;   Fair  Judgement:  Fair  Insight:  Fair  Psychomotor Activity:  Normal  Concentration:  Concentration: Fair and Attention Span: Fair  Recall:  [employer] of Knowledge:  Fair  Language:  Good  Akathisia:  Negative  Handed:  Right  AIMS (if indicated):     Assets:  Communication Skills Desire for Improvement Resilience Social Support  ADL's:  Intact  Cognition:  WNL  Sleep:  Number of Hours: 6.25     Treatment Plan Summary: Daily contact with patient to assess and evaluate symptoms and progress in treatment   BZD/Opiate WDL- Continued Librium detox protocol and  Opiate detox protocol.  MDD recurrent severe without psychosis-Restart Lexapro 10 mgrs QDAY.  Insomnia: Discontinued Trazodone as patient reports it is ineffective. Started Seroquel 50 mgm po daily at bedtime for insomnia and to augment Lexapro for depression.   Other:  Safety: Will conitnue 15 minute observation for safety checks. Patient is able to contract for safety on the unit at this time  Labs: UDS positive for benzos and THC. Ethanol negative. TSH and HgbA1c in process. Lipid panel HDL 36 LDL 100 otherwise normal.   Continue to develop  treatment plan to decrease risk of relapse upon discharge and to reduce the need for readmission.  Psycho-social education regarding relapse prevention and self care.  Health care follow up as needed for medical problems.  Continue to attend and participate in therapy.     Denzil Magnuson, NP 11/09/2017, 9:27 AM    .Agree with NP Progress Note

## 2017-11-10 ENCOUNTER — Encounter (HOSPITAL_COMMUNITY): Payer: Self-pay | Admitting: Behavioral Health

## 2017-11-10 NOTE — Progress Notes (Signed)
  [facility] Adult Case Management Discharge Plan :  Will you be returning to the same living situation after discharge:  No. [facility] in [place].  At discharge, do you have transportation home?: Yes,  family member will pick him up at 8:30AM on Monday, 8/26 and will take him to the airport.  Do you have the ability to pay for your medications: Yes,  [employer] insurance  Release of information consent forms completed and submitted to medical records by CSW.   Patient to Follow up at: Follow-up Information    [facility] Follow up on 11/13/2017.   Why:  You have been accepted for admission for Monday evening 11/13/17. Please plan to discharge from the hospital Monday morning around 8:30AM. Thank you.  Contact information: [address] [place], [place] [id number] Phone: [phone] Fax: [phone]          Next level of care provider has access to [facility] Link:no  Safety Planning and Suicide Prevention discussed: Yes,  SPE completed with pt's father and with pt. SPI pamphlet and Mobile Crisis information provided.   Have you used any form of tobacco in the last 30 days? (Cigarettes, Smokeless Tobacco, Cigars, and/or Pipes): No  Has patient been referred to the Quitline?: N/A patient is not a smoker  Patient has been referred for addiction treatment: Yes  Rona RavensHeather S Alix Lahmann, LCSW 11/10/2017, 2:54 PM

## 2017-11-10 NOTE — Progress Notes (Addendum)
D Pt is seen [facility] on the [address] today. HE tolerates this quite well. HE makes good eye contact. He laughs and jokes frequetly with this nurse, saying " yes mamm". He is dressed appropriately. He dmonstrates forward thinking as evidenced by hs conversation about his problems / feelings / current ideation, .    A HE completed his daily assessment and on this he wrote he denied SI today  and he rated his depression, hopelessness and anxiety "5/5/5/",respectively. He got quite anxious when he was expectivng his mother to call and said " that's how I usually feel when I talk to her". He deniees active w9ithdrawal symptoms.    R Safety is in place.

## 2017-11-10 NOTE — Progress Notes (Addendum)
[facility] MD Progress Note  11/10/2017 10:08 AM DEVANSH RIESE  MRN:  [id number]  Subjective: " The Seroquel work a little better for my sleep but I still woke up. My mood is better and I am going to [place] for continued treatment when I leave here."    Evaluation on the unit: Face to face evaluation completed, case discussed with treatment team and chart reviewed. Nicholas Harrison is a 27 year old male who was admitted to the unit following transfer from [facility] ED. Patient has a significant substance abuse history and was evaluated in the ED after abusing opioids and benzodiazepines. Prior to his admission, he presented to the ED with significant withdrawal symptoms including visual hallucinations, confusion, and disorientation. During the initial assessment on the unit, patient continued to appear disoriented and he continued to endorse withdrawal symptoms although he denies AVH or other psychosis.    As of today, patient is presenting with much improvement in mood. He rates current level of depression as 2/10 and denies any significant nero vegatative symptoms. He denies SI, HI or AVH and does not appear internally preoccupied. He is not in an active withdrawal state and denies withdrawal symptoms. He is attending and participating in unit activities.  He endorse slight improvement in sleeping pattern with administration of Seroquel although endorses he continues to have some difficulties with staying asleep. Endorses slightly decrease in appetite although he did eat breakfast this morning without any difficulty. His interviewed was held with [facility] in [place] and clinical information has been faxed. Patient may be accepted pending review of information. He remains open to this discharge plan.. At this time, he is contracting for safety on the unit.     Principal Problem: MDD (major depressive disorder), recurrent severe, without psychosis (HCC) Diagnosis:   Patient Active Problem List   Diagnosis  Date Noted  . MDD (major depressive disorder), recurrent severe, without psychosis (HCC) [F33.2] 11/07/2017  . 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 Polysubstance abuse (HCC) [F19.10] 10/19/2016  . 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 Moderate tetrahydrocannabinol (THC) dependence (HCC) [F12.20] 05/31/2016  . Opioid use disorder, severe, dependence (HCC) [F11.20] 10/29/2014  . ADD (attention deficit disorder) [F98.8] 02/15/2012  . Well adult exam [Z00.00] 09/02/2011  . Rash [R21] 09/02/2011  . Chronic anxiety [F41.9] 06/05/2011   Total Time spent with patient: 20 minutes  Past Psychiatric History: Polysubstance abuse, depression, anxiety. Pt has been in residential treatment twice in past 2 year. Currently seen at [facility] for outpt mental healthservices and therapy at [employer].  Past Medical History:  Past Medical History:  Diagnosis Date  . Seizures (HCC)     Past Surgical History:  Procedure Laterality Date  . HAND SURGERY     Family History:  Family History  Problem Relation Age of Onset  . Depression Mother        anxiety  . Diabetes Maternal Grandfather    Family Psychiatric  History: Father history of alcohol abuse. Social History:  Social History   Substance and Sexual Activity  Alcohol Use No     Social History   Substance and Sexual Activity  Drug Use Yes  . Types: Benzodiazepines, Oxycodone    Social History   Socioeconomic History  . Marital status: Single    Spouse name: Not on file  . Number of children: Not on file  . Years of education: Not on file  . Highest education  level: Not on file  Occupational History  . Not on file  Social Needs  . Financial resource strain: Not on file  . Food insecurity:    Worry: Not on file    Inability: Not on file  . Transportation needs:    Medical: Not on file    Non-medical: Not on file  Tobacco  Use  . Smoking status: Never Smoker  . Smokeless tobacco: Former NeurosurgeonUser    Types: Chew  . Tobacco comment: only experimented  Substance and Sexual Activity  . Alcohol use: No  . Drug use: Yes    Types: Benzodiazepines, Oxycodone  . Sexual activity: Not Currently  Lifestyle  . Physical activity:    Days per week: Not on file    Minutes per session: Not on file  . Stress: Not on file  Relationships  . Social connections:    Talks on phone: Not on file    Gets together: Not on file    Attends religious service: Not on file    Active member of club or organization: Not on file    Attends meetings of clubs or organizations: Not on file    Relationship status: Not on file  Other Topics Concern  . Not on file  Social History Narrative  . Not on file   Additional Social History:                         Sleep: Poor  Appetite:  Fair  Current Medications: Current Facility-Administered Medications  Medication Dose Route Frequency Provider Last Rate Last Dose  . acetaminophen (TYLENOL) tablet 650 mg  650 mg Oral Q6H PRN Wanya Bangura, Rockey SituFernando A, MD   650 mg at 11/09/17 1822  . chlordiazePOXIDE (LIBRIUM) capsule 25 mg  25 mg Oral Q6H PRN Rankin, Shuvon B, NP      . chlordiazePOXIDE (LIBRIUM) capsule 25 mg  25 mg Oral BH-qamhs Rankin, Shuvon B, NP       Followed by  . [START ON 11/12/2017] chlordiazePOXIDE (LIBRIUM) capsule 25 mg  25 mg Oral Daily Rankin, Shuvon B, NP      . cloNIDine (CATAPRES) tablet 0.1 mg  0.1 mg Oral QID Nira ConnBerry, Jason A, NP   0.1 mg at 11/10/17 [id number]   Followed by  . cloNIDine (CATAPRES) tablet 0.1 mg  0.1 mg Oral BH-qamhs Jackelyn PolingBerry, Jason A, NP       Followed by  . [START ON 11/13/2017] cloNIDine (CATAPRES) tablet 0.1 mg  0.1 mg Oral QAC breakfast Nira ConnBerry, Jason A, NP      . dicyclomine (BENTYL) tablet 20 mg  20 mg Oral Q6H PRN Nira ConnBerry, Jason A, NP      . escitalopram (LEXAPRO) tablet 10 mg  10 mg Oral Daily Donalee Gaumond, Rockey SituFernando A, MD   10 mg at 11/10/17 0824  . hydrOXYzine  (ATARAX/VISTARIL) tablet 25 mg  25 mg Oral Q6H PRN Rankin, Shuvon B, NP   25 mg at 11/09/17 2116  . loperamide (IMODIUM) capsule 2-4 mg  2-4 mg Oral PRN Rankin, Shuvon B, NP      . methocarbamol (ROBAXIN) tablet 500 mg  500 mg Oral Q8H PRN Nira ConnBerry, Jason A, NP      . multivitamin with minerals tablet 1 tablet  1 tablet Oral Daily Rankin, Shuvon B, NP   1 tablet at 11/10/17 0824  . naproxen (NAPROSYN) tablet 500 mg  500 mg Oral BID PRN Jackelyn PolingBerry, Jason A, NP   500 mg at 11/09/17  1142  . nicotine (NICODERM CQ - dosed in mg/24 hours) patch 21 mg  21 mg Transdermal Daily Ilo Beamon, Rockey Situ, MD   21 mg at 11/10/17 0823  . ondansetron (ZOFRAN-ODT) disintegrating tablet 4 mg  4 mg Oral Q6H PRN Rankin, Shuvon B, NP      . QUEtiapine (SEROQUEL) tablet 50 mg  50 mg Oral QHS Denzil Magnuson, NP   50 mg at 11/09/17 2116  . thiamine (VITAMIN B-1) tablet 100 mg  100 mg Oral Daily Rankin, Shuvon B, NP   100 mg at 11/10/17 4098    Lab Results:  Results for orders placed or performed during the hospital encounter of 11/07/17 (from the past 48 hour(s))  Hemoglobin A1c     Status: Abnormal   Collection Time: 11/09/17  6:26 AM  Result Value Ref Range   Hgb A1c MFr Bld 4.7 (L) 4.8 - 5.6 %    Comment: (NOTE) Pre diabetes:          5.7%-6.4% Diabetes:              >6.4% Glycemic control for   <7.0% adults with diabetes    Mean Plasma Glucose 88.19 mg/dL    Comment: Performed at [facility], [address], [place], [place] [address]  Lipid panel     Status: Abnormal   Collection Time: 11/09/17  6:26 AM  Result Value Ref Range   Cholesterol 152 0 - 200 mg/dL   Triglycerides 79 <782 mg/dL   HDL 36 (L) >95 mg/dL   Total CHOL/HDL Ratio 4.2 RATIO   VLDL 16 0 - 40 mg/dL   LDL Cholesterol 621 (H) 0 - 99 mg/dL    Comment:        Total Cholesterol/HDL:CHD Risk Coronary Heart Disease Risk Table                     Men   Women  1/2 Average Risk   3.4   3.3  Average Risk       5.0   4.4  2 X Average Risk   9.6    7.1  3 X Average Risk  23.4   11.0        Use the calculated Patient Ratio above and the CHD Risk Table to determine the patient's CHD Risk.        ATP III CLASSIFICATION (LDL):  <100     mg/dL   Optimal  308-657  mg/dL   Near or Above                    Optimal  130-159  mg/dL   Borderline  846-962  mg/dL   High  >952     mg/dL   Very High Performed at [facility], [address], [place], [place] [address]   TSH     Status: None   Collection Time: 11/09/17  6:30 PM  Result Value Ref Range   TSH 4.489 0.350 - 4.500 uIU/mL    Comment: Performed by a 3rd Generation assay with a functional sensitivity of <=0.01 uIU/mL. Performed at [facility], [address], [place], [place] [address]     Blood Alcohol level:  Lab Results  Component Value Date   [facility] <10 11/07/2017   ETH <10 06/15/2017    Metabolic Disorder Labs: Lab Results  Component Value Date   HGBA1C 4.7 (L) 11/09/2017   MPG 88.19 11/09/2017   No results  found for: PROLACTIN Lab Results  Component Value Date   CHOL 152 11/09/2017   TRIG 79 11/09/2017   HDL 36 (L) 11/09/2017   CHOLHDL 4.2 11/09/2017   VLDL 16 11/09/2017   LDLCALC 100 (H) 11/09/2017    Physical Findings: AIMS: Facial and Oral Movements Muscles of Facial Expression: None, normal Lips and Perioral Area: None, normal Jaw: None, normal Tongue: None, normal,Extremity Movements Upper (arms, wrists, hands, fingers): None, normal Lower (legs, knees, ankles, toes): None, normal, Trunk Movements Neck, shoulders, hips: None, normal, Overall Severity Severity of abnormal movements (highest score from questions above): None, normal Incapacitation due to abnormal movements: None, normal Patient's awareness of abnormal movements (rate only patient's report): No Awareness, Dental Status Current problems with teeth and/or dentures?: No Does patient usually wear dentures?: No  CIWA:  CIWA-Ar Total: 1 COWS:   COWS Total Score: 0  Musculoskeletal: Strength & Muscle Tone: within normal limits Gait & Station: normal Patient leans: N/A  Psychiatric Specialty Exam: Physical Exam  Nursing note and vitals reviewed. Constitutional: He is oriented to person, place, and time.  Neurological: He is alert and oriented to person, place, and time.    Review of Systems  Psychiatric/Behavioral: Positive for depression and substance abuse. Negative for hallucinations, memory loss and suicidal ideas. The patient is nervous/anxious and has insomnia.   All other systems reviewed and are negative.   Blood pressure 106/70, pulse 77, temperature 98.2 F (36.8 C), temperature source Oral, resp. rate 18, height 5' 8.5" (1.74 m), weight 70.8 kg.Body mass index is 23.37 kg/m.  General Appearance: Fairly Groomed  Eye Contact:  Good  Speech:  Clear and Coherent and Normal Rate  Volume:  Normal  Mood:  Depressed yet continues to improve   Affect:  Congruent  Thought Process:  Coherent, Goal Directed, Linear and Descriptions of Associations: Intact  Orientation:  Full (Time, Place, and Person)  Thought Content:  Logical  Suicidal Thoughts:  No  Homicidal Thoughts:  No  Memory:  Immediate;   Fair Recent;   Fair  Judgement:  Fair  Insight:  Fair  Psychomotor Activity:  Normal  Concentration:  Concentration: Fair and Attention Span: Fair  Recall:  [employer] of Knowledge:  Fair  Language:  Good  Akathisia:  Negative  Handed:  Right  AIMS (if indicated):     Assets:  Communication Skills Desire for Improvement Resilience Social Support  ADL's:  Intact  Cognition:  WNL  Sleep:  Number of Hours: 6.75     Treatment Plan Summary: Reviewed current treatment plan. Will continue the following plan without adjustments at this time.  Daily contact with patient to assess and evaluate symptoms and progress in treatment   BZD/Opiate WDL- Continued Librium detox protocol and  Opiate detox protocol.  MDD  recurrent severe without psychosis-Continue Lexapro 10 mgrs QDAY.  Insomnia: Continue Seroquel 50 mg po daily at bedtime for insomnia and to augment Lexapro for depression/mood. Patient has only had one dose of the medication. Will give another dose and if he continues to have issues with sleep, will titrate as appropriate.    Other:  Safety: Will conitnue 15 minute observation for safety checks. Patient is able to contract for safety on the unit at this time  Labs: UDS positive for benzos and THC. Ethanol negative. TSH and HgbA1c in process. Lipid panel HDL 36 LDL 100 otherwise normal.   Continue to develop treatment plan to decrease risk of relapse upon discharge and to reduce the need  for readmission.  Psycho-social education regarding relapse prevention and self care.  Health care follow up as needed for medical problems.  Continue to attend and participate in therapy.   CSW to continue to work on discharge plan  Denzil Magnuson, NP 11/10/2017, 10:08 AM    Patient ID: Nicholas Harrison, male   DOB: 1991/03/09, 27 y.o.   MRN: [id number] .Marland KitchenAgree with NP Progress Note

## 2017-11-10 NOTE — Progress Notes (Signed)
Recreation Therapy Notes  Date: 8.23.19 Time: 0930 Location: [address] Dayroom  Group Topic: Stress Management  Goal Area(s) Addresses:  Patient will verbalize importance of using healthy stress management.  Patient will identify positive emotions associated with healthy stress management.   Intervention: Stress Management  Activity :  Progressive Muscle Relaxation.  LRT introduced the stress management technique of progressive muscle relaxation.  LRT read a script on tensing then relaxing each muscle group individually.  Patients were to follow along as LRT read the script to engage in the activity.  Education:  Stress Management, Discharge Planning.   Education Outcome: Acknowledges edcuation/In group clarification offered/Needs additional education  Clinical Observations/Feedback: Patient did not attend group.    Caroll RancherMarjette Shaylynne Lunt, LRT/CTRS         Caroll RancherLindsay, Andres Escandon A 11/10/2017 12:13 PM

## 2017-11-11 MED ORDER — LOPERAMIDE HCL 2 MG PO CAPS
ORAL_CAPSULE | ORAL | Status: AC
  Filled 2017-11-11: qty 1

## 2017-11-11 MED ORDER — LOPERAMIDE HCL 2 MG PO CAPS
2.0000 mg | ORAL_CAPSULE | ORAL | Status: DC | PRN
  Administered 2017-11-11: 2 mg via ORAL
  Administered 2017-11-12: 4 mg via ORAL
  Filled 2017-11-11: qty 2

## 2017-11-11 MED ORDER — QUETIAPINE FUMARATE 25 MG PO TABS
75.0000 mg | ORAL_TABLET | Freq: Every day | ORAL | Status: DC
  Administered 2017-11-11 – 2017-11-12 (×2): 75 mg via ORAL
  Filled 2017-11-11 (×3): qty 3

## 2017-11-11 NOTE — Progress Notes (Signed)
D: Patient observed up and interactive in milieu. Patient states he has had a good day and [place] [facility] [place] [facility] [place] [facility] Patient observed to be slightly tremulous and markedly anxious. "I think I'm just overthinking things. Just feeling a little nervous." Patient's affect anxious with congruent mood. Pleasant and cooperative.  Denies pain, physical complaints other than those already stated. COWS is a "5" and BP slightly elevated. "It's up but I think it's just my anxiety."   A: Medicated per orders, no prns requested or required. Medication education provided. Informed seroquel was increased to 75mg . Level III obs in place for safety. Emotional support offered. Patient encouraged to complete Suicide Safety Plan before discharge. Encouraged to attend and participate in unit programming.     R: Patient verbalizes understanding of POC. On reassess, patient reported moderate relief from medications. "I think I can rest now." Patient denies SI/HI/AVH and remains safe on level III obs. Will continue to monitor throughout the night.

## 2017-11-11 NOTE — Progress Notes (Signed)
[facility] MD Progress Note  11/11/2017 9:25 AM Nicholas Harrison  MRN:  [id number]  Subjective: patient reports overall improvement and is currently future oriented, planning on going to [facility] rehab setting in [place] on Monday. Denies medication side effects. Does report some ongoing/residual insomnia.    Objective: I have reviewed chart notes and have met with patient.  27 year old male, history of opiate and BZD Use Disorder, presented to hospital due with family requesting treatment for substance abuse, reporting symptoms of BZD withdrawal . At this time patient reports he is " doing a lot better" and does not endorse significant WDL symptoms at this time, other than some loose stools. He presents calm and in no acute distress . His vitals are stable. States he has been able to eat his meals without difficulty and denies vomiting. He is future oriented, and expecting to go to a rehab program in [place] early next week. As above, reports residual insomnia, states he wakes up often during night.  Has been visible on unit, interacting appropriately with peers- staff has noted patient presents  vaguely anxious at times. Denies medication side effects.    Principal Problem: MDD (major depressive disorder), recurrent severe, without psychosis ([place]) Diagnosis:   Patient Active Problem List   Diagnosis Date Noted  . MDD (major depressive disorder), recurrent severe, without psychosis ([place]) [F33.2] 11/07/2017  . 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 Polysubstance abuse ([place]) [F19.10] 10/19/2016  . Benzodiazepine dependence, episodic ([place]) [F13.20] 10/14/2016  . Moderate tetrahydrocannabinol (THC) dependence ([place]) [F12.20] 05/31/2016  . Opioid use disorder, severe, dependence ([place]) [F11.20] 10/29/2014  . ADD (attention deficit disorder) [F98.8] 02/15/2012  . Well adult exam [Z00.00] 09/02/2011  . Rash [R21] 09/02/2011  . Chronic anxiety  [F41.9] 06/05/2011   Total Time spent with patient: 15 minutes  Past Psychiatric History: Polysubstance abuse, depression, anxiety. Pt has been in residential treatment twice in past 2 year. Currently seen at [facility] for outpt mental healthservices and therapy at [employer].  Past Medical History:  Past Medical History:  Diagnosis Date  . Seizures ([place])     Past Surgical History:  Procedure Laterality Date  . HAND SURGERY     Family History:  Family History  Problem Relation Age of Onset  . Depression Mother        anxiety  . Diabetes Maternal Grandfather    Family Psychiatric  History: Father history of alcohol abuse. Social History:  Social History   Substance and Sexual Activity  Alcohol Use No     Social History   Substance and Sexual Activity  Drug Use Yes  . Types: Benzodiazepines, Oxycodone    Social History   Socioeconomic History  . Marital status: Single    Spouse name: Not on file  . Number of children: Not on file  . Years of education: Not on file  . Highest education level: Not on file  Occupational History  . Not on file  Social Needs  . Financial resource strain: Not on file  . Food insecurity:    Worry: Not on file    Inability: Not on file  . Transportation needs:    Medical: Not on file    Non-medical: Not on file  Tobacco Use  . Smoking status: Never Smoker  . Smokeless tobacco: Former Systems developer    Types: Chew  . Tobacco comment: only experimented  Substance and Sexual Activity  . Alcohol use: No  .  Drug use: Yes    Types: Benzodiazepines, Oxycodone  . Sexual activity: Not Currently  Lifestyle  . Physical activity:    Days per week: Not on file    Minutes per session: Not on file  . Stress: Not on file  Relationships  . Social connections:    Talks on phone: Not on file    Gets together: Not on file    Attends religious service: Not on file    Active member of club or organization: Not on file     Attends meetings of clubs or organizations: Not on file    Relationship status: Not on file  Other Topics Concern  . Not on file  Social History Narrative  . Not on file   Additional Social History:                         Sleep: Fair  Appetite:  improving   Current Medications: Current Facility-Administered Medications  Medication Dose Route Frequency Provider Last Rate Last Dose  . acetaminophen (TYLENOL) tablet 650 mg  650 mg Oral Q6H PRN Abbi Mancini, Myer Peer, MD   650 mg at 11/11/17 0915  . [START ON 11/12/2017] chlordiazePOXIDE (LIBRIUM) capsule 25 mg  25 mg Oral Daily Rankin, Shuvon B, NP      . cloNIDine (CATAPRES) tablet 0.1 mg  0.1 mg Oral BH-qamhs Lindon Romp A, NP   0.1 mg at 11/11/17 0911   Followed by  . [START ON 11/13/2017] cloNIDine (CATAPRES) tablet 0.1 mg  0.1 mg Oral QAC breakfast Lindon Romp A, NP      . dicyclomine (BENTYL) tablet 20 mg  20 mg Oral Q6H PRN Lindon Romp A, NP      . escitalopram (LEXAPRO) tablet 10 mg  10 mg Oral Daily Tallia Moehring, Myer Peer, MD   10 mg at 11/11/17 0910  . methocarbamol (ROBAXIN) tablet 500 mg  500 mg Oral Q8H PRN Lindon Romp A, NP      . multivitamin with minerals tablet 1 tablet  1 tablet Oral Daily Rankin, Shuvon B, NP   1 tablet at 11/11/17 0910  . naproxen (NAPROSYN) tablet 500 mg  500 mg Oral BID PRN Lindon Romp A, NP   500 mg at 11/09/17 1142  . nicotine (NICODERM CQ - dosed in mg/24 hours) patch 21 mg  21 mg Transdermal Daily Joesiah Lonon, Myer Peer, MD   21 mg at 11/11/17 0910  . QUEtiapine (SEROQUEL) tablet 50 mg  50 mg Oral QHS Mordecai Maes, NP   50 mg at 11/10/17 2125  . thiamine (VITAMIN B-1) tablet 100 mg  100 mg Oral Daily Rankin, Shuvon B, NP   100 mg at 11/11/17 4196    Lab Results:  Results for orders placed or performed during the hospital encounter of 11/07/17 (from the past 48 hour(s))  TSH     Status: None   Collection Time: 11/09/17  6:30 PM  Result Value Ref Range   TSH 4.489 0.350 - 4.500 uIU/mL     Comment: Performed by a 3rd Generation assay with a functional sensitivity of <=0.01 uIU/mL. Performed at [facility], [place] [address], [place], [place] [address]     Blood Alcohol level:  Lab Results  Component Value Date   [facility] <10 11/07/2017   ETH <10 98/92/1194    Metabolic Disorder Labs: Lab Results  Component Value Date   HGBA1C 4.7 (L) 11/09/2017   MPG 88.19 11/09/2017   No results  found for: PROLACTIN Lab Results  Component Value Date   CHOL 152 11/09/2017   TRIG 79 11/09/2017   HDL 36 (L) 11/09/2017   CHOLHDL 4.2 11/09/2017   VLDL 16 11/09/2017   LDLCALC 100 (H) 11/09/2017    Physical Findings: AIMS: Facial and Oral Movements Muscles of Facial Expression: None, normal Lips and Perioral Area: None, normal Jaw: None, normal Tongue: None, normal,Extremity Movements Upper (arms, wrists, hands, fingers): None, normal Lower (legs, knees, ankles, toes): None, normal, Trunk Movements Neck, shoulders, hips: None, normal, Overall Severity Severity of abnormal movements (highest score from questions above): None, normal Incapacitation due to abnormal movements: None, normal Patient's awareness of abnormal movements (rate only patient's report): No Awareness, Dental Status Current problems with teeth and/or dentures?: No Does patient usually wear dentures?: No  CIWA:  CIWA-Ar Total: 2 COWS:  COWS Total Score: 8  Musculoskeletal: Strength & Muscle Tone: within normal limits Gait & Station: normal Patient leans: N/A  Psychiatric Specialty Exam: Physical Exam  Nursing note and vitals reviewed. Constitutional: He is oriented to person, place, and time.  Neurological: He is alert and oriented to person, place, and time.    Review of Systems  Psychiatric/Behavioral: Positive for depression and substance abuse. Negative for hallucinations, memory loss and suicidal ideas. The patient is nervous/anxious and has insomnia.   All other systems reviewed  and are negative. reports insomnia, mild loose stools , no vomiting,  no fever, no chills   Blood pressure 125/86, pulse 84, temperature 98.2 F (36.8 C), temperature source Oral, resp. rate 18, height 5' 8.5" (1.74 m), weight 70.8 kg.Body mass index is 23.37 kg/m.  General Appearance: Fairly Groomed  Eye Contact:  Good  Speech:  Normal Rate  Volume:  Normal  Mood:  improving mood    Affect:  Full Range and mildly anxious   Thought Process:  Linear and Descriptions of Associations: Intact  Orientation:  Full (Time, Place, and Person)  Thought Content: no hallucinations, no delusions  Suicidal Thoughts:  No denies SI or self injurious ideations  Homicidal Thoughts:  No  Memory:  recent and remote grossly intact   Judgement:  Other:  improving   Insight:  Fair and improving   Psychomotor Activity:  Normal  Concentration:  Concentration: Good and Attention Span: Good  Recall:  Good  Fund of Knowledge:  Good  Language:  Good  Akathisia:  Negative  Handed:  Right  AIMS (if indicated):     Assets:  Communication Skills Desire for Improvement Resilience Social Support  ADL's:  Intact  Cognition:  WNL  Sleep:  Number of Hours: 6.5    Assessment - patient presents with improving mood, range of affect , and currently reports feeling a lot better than he did on admission. Reports improving /resolving withdrawal symptoms, does not appear to be in any acute distress , vitals are stable, and is tolerating PO well . Denies SI and is future oriented . He reports residual insomnia. Tolerating medications well . Treatment Plan Summary: Treatment plan reviewed as below today 8/24   Reviewed current treatment plan. Will continue the following plan without adjustments at this time.  Daily contact with patient to assess and evaluate symptoms and progress in treatment  Continue to encourage group /milieu participation to work on coping skills and symptom reduction Continue to encourage efforts to  work on sobriety and relapse prevention Treatment Team working on disposition planning - at this time plan is to enter a residential rehab setting in  [place] early next week. Continue Lexapro 10 mgrs QDAY for depression and anxiety Increase Seroquel to 75 mgrs QHS for mood, insomnia  Completing Librium detox protocol to address BZD withdrawal Completing Catapress detox protocol to address opiate withdrawal  Jenne Campus, MD 11/11/2017, 9:25 AM    Patient ID: Laurier Nancy, male   DOB: May 28, 1990, 27 y.o.   MRN: [id number]

## 2017-11-11 NOTE — Plan of Care (Signed)
  Problem: Education: Goal: Knowledge of [facility] Education information/materials will improve Outcome: Completed/Met Goal: Verbalization of understanding the information provided will improve Outcome: Completed/Met Patient verbalizing understanding of information, education provided.    Problem: Activity: Goal: Interest or engagement in activities will improve Outcome: Completed/Met Patient interacting with peers, visible in the milieu. Attended AA this evening.

## 2017-11-11 NOTE — [facility] Group Notes (Signed)
[facility] Group Notes:  (Nursing/MHT/Case Management/Adjunct)  Date:  11/11/2017  Time:  1:39 PM  Type of Therapy:  Psychoeducational Skills  Participation Level:  Active  Participation Quality:  Appropriate  Affect:  Appropriate  Cognitive:  Appropriate  Insight:  Appropriate  Engagement in Group:  Engaged  Modes of Intervention:  Problem-solving  Summary of Progress/Problems: Pt attended Psychoeducational group with top topic anger management.   Jacquelyne BalintForrest, Nikkita Adeyemi Shanta 11/11/2017, 1:39 PM

## 2017-11-11 NOTE — Progress Notes (Signed)
Patient did attend the evening speaker AA meeting.  

## 2017-11-11 NOTE — Progress Notes (Signed)
D: Patient observed up and around milieu. Interacting with peers. Attended AA tonight. Patient states "my withdrawal is much better. Just some loose stools is all." Patient observed to be anxious, tremulous, restless. COWS scored at a "8" with VSS.  Patient's affect animated, anxious with congruent mood.  Denies pain.   A: Medicated per orders, prn loperamide given for diarrhea. Medication education provided. Level III obs in place for safety. Emotional support offered. Patient encouraged to complete Suicide Safety Plan before discharge. Encouraged to attend and participate in unit programming.   R: Patient verbalizes understanding of POC. On reassess, patient reports relief. Patient denies SI/HI/AVH and remains safe on level III obs. Will continue to monitor throughout the night.

## 2017-11-11 NOTE — Plan of Care (Signed)
  Problem: Education: Goal: Emotional status will improve Outcome: Progressing   

## 2017-11-11 NOTE — Progress Notes (Signed)
D Pt is observed OOB UAL on the 300 hall today. HE tolerates this very well. He smiles at this writer frequently,  He is observed socializing with his peers in the 300 hall  HE endorses a flat, anxious affect. He is attending his groups.     A He completed his daily assessment and on this he wrote he denied SI today and he rated his depression, hopelessness and anxiety " 4/3/4", respectively.He was quite engaged in his Life SKills group today, he shared his personal feelings with his peers and he is actively trying to learn healtheir ways of coping.     R Safety is in place.

## 2017-11-11 NOTE — [facility] Group Notes (Signed)
LCSW Group Therapy Note  11/11/2017   10:00--11:00am   Type of Therapy and Topic:  Group Therapy: Anger Cues and Responses  Participation Level:  Active   Description of Group:   In this group, patients learned how to recognize the physical, cognitive, emotional, and behavioral responses they have to anger-provoking situations.  They identified a recent time they became angry and how they reacted.  They analyzed how their reaction was possibly beneficial and how it was possibly unhelpful.  The group discussed a variety of healthier coping skills that could help with such a situation in the future.  Deep breathing was practiced briefly.  Therapeutic Goals: 1. Patients will remember their last incident of anger and how they felt emotionally and physically, what their thoughts were at the time, and how they behaved. 2. Patients will identify how their behavior at that time worked for them, as well as how it worked against them. 3. Patients will explore possible new behaviors to use in future anger situations. 4. Patients will learn that anger itself is normal and cannot be eliminated, and that healthier reactions can assist with resolving conflict rather than worsening situations.  Summary of Patient Progress:  The patient shared that his most recent anger moment happened while he was in the main hospital and described feeling angry because he was in the hospital. He stated that he when he is angry and there is no resolution he has a tendency to say "F" it and used. He recognizes that this pattern of thinking has lead to him remaining in active addictions. He plans on going to rehab and make the positive changes that are needed for him to go forward.  Therapeutic Modalities:   Cognitive Behavioral Therapy  Evorn Gongonnie D Jaydalynn Olivero

## 2017-11-12 MED ORDER — ESCITALOPRAM OXALATE 10 MG PO TABS: 10.0000 mg | ORAL_TABLET | Freq: Every day | ORAL | 0 refills | Status: DC

## 2017-11-12 MED ORDER — QUETIAPINE FUMARATE 25 MG PO TABS: 75.0000 mg | ORAL_TABLET | Freq: Every day | ORAL | 0 refills | Status: DC

## 2017-11-12 MED ORDER — NICOTINE 21 MG/24HR TD PT24: 21.0000 mg | MEDICATED_PATCH | Freq: Every day | TRANSDERMAL | 0 refills | Status: DC

## 2017-11-12 NOTE — [facility] Group Notes (Signed)
[facility] LCSW Group Therapy Note  Date/Time:  11/12/2017 10:00-11:00AM  Type of Therapy and Topic:  Group Therapy:  Healthy and Unhealthy Supports  Participation Level:  Did Not Attend   Description of Group:  Patients in this group were introduced to the idea of adding a variety of healthy supports to address the various needs in their lives.Patients discussed what additional healthy supports could be helpful in their recovery and wellness after discharge in order to prevent future hospitalizations.   An emphasis was placed on using counselor, doctor, therapy groups, 12-step groups, and problem-specific support groups to expand supports.  They also worked as a group on developing a specific plan for several patients to deal with unhealthy supports through boundary-setting, psychoeducation with loved ones, and even termination of relationships.   Therapeutic Goals:   1)  discuss importance of adding supports to stay well once out of the hospital  2)  compare healthy versus unhealthy supports and identify some examples of each  3)  generate ideas and descriptions of healthy supports that can be added  4)  offer mutual support about how to address unhealthy supports  5)  encourage active participation in and adherence to discharge plan    Summary of Patient Progress:   Did not attend   Therapeutic Modalities:   Motivational Interviewing Brief Solution-Focused Therapy  Nicholas Harrison D [name]         

## 2017-11-12 NOTE — [facility] Group Notes (Signed)
[facility] Group Notes:  (Nursing/MHT/Case Management/Adjunct)  Date:  11/12/2017  Time:  1:31 PM  Type of Therapy:  Psychoeducational Skills  Participation Level:  Active  Participation Quality:  Appropriate  Affect:  Appropriate  Cognitive:  Appropriate  Insight:  Appropriate  Engagement in Group:  Engaged  Modes of Intervention:  Problem-solving  Summary of Progress/Problems: Pt attended Psychoeducational group with top topic healthy support systems.   Jacquelyne BalintForrest, Dorothe Elmore Shanta 11/12/2017, 1:31 PM

## 2017-11-12 NOTE — Plan of Care (Signed)
  Problem: Education: Goal: Emotional status will improve Outcome: Progressing   Problem: Activity: Goal: Sleeping patterns will improve Outcome: Progressing   

## 2017-11-12 NOTE — Discharge Summary (Addendum)
Physician Discharge Summary Note  Patient:  Nicholas Harrison is an 27 y.o., male MRN:  [id number] DOB:  15-Dec-1990 Patient phone:  [phone] (home)  Patient address:   [address] [place] [address],  Total Time spent with patient: 20 minutes  Date of Admission:  11/07/2017 Date of Discharge: 11/13/2017  Reason for Admission:  Per assessment note:  Nicholas Harrison is a 27 year old male single who lives alone. He has no kids and is currently employed working in Research scientist (life sciences) sales:Nicholas Harrison an 27 y.o.male.Pt reports daily use of both xanax and oxycodone, as well as daily use of marijuana. Pt having significant withdrawal symptoms including visual hallucinations, confusion, and disorientation. Pt has been in residential treatment twice in past 2 years but only maintained sobriety for less than a month once released. Pt reports ongoingdepression but denies SI/HI. Currently seen at [facility] for outpt mental healthservices but would like residential treatment.  Principal Problem: MDD (major depressive disorder), recurrent severe, without psychosis [facility]) Discharge Diagnoses: Patient Active Problem List   Diagnosis Date Noted  . MDD (major depressive disorder), recurrent severe, without psychosis (HCC) [F33.2] 11/07/2017  . 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 Polysubstance abuse (HCC) [F19.10] 10/19/2016  . 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 Moderate tetrahydrocannabinol (THC) dependence (HCC) [F12.20] 05/31/2016  . Opioid use disorder, severe, dependence (HCC) [F11.20] 10/29/2014  . ADD (attention deficit disorder) [F98.8] 02/15/2012  . Well adult exam [Z00.00] 09/02/2011  . Rash [R21] 09/02/2011  . Chronic anxiety [F41.9] 06/05/2011    Past Psychiatric History:   Past Medical History:  Past Medical History:  Diagnosis Date  . Seizures (HCC)     Past Surgical History:   Procedure Laterality Date  . HAND SURGERY     Family History:  Family History  Problem Relation Age of Onset  . Depression Mother        anxiety  . Diabetes Maternal Grandfather    Family Psychiatric  History:  Social History:  Social History   Substance and Sexual Activity  Alcohol Use No     Social History   Substance and Sexual Activity  Drug Use Yes  . Types: Benzodiazepines, Oxycodone    Social History   Socioeconomic History  . Marital status: Single    Spouse name: Not on file  . Number of children: Not on file  . Years of education: Not on file  . Highest education level: Not on file  Occupational History  . Not on file  Social Needs  . Financial resource strain: Not on file  . Food insecurity:    Worry: Not on file    Inability: Not on file  . Transportation needs:    Medical: Not on file    Non-medical: Not on file  Tobacco Use  . Smoking status: Never Smoker  . Smokeless tobacco: Former Neurosurgeon    Types: Chew  . Tobacco comment: only experimented  Substance and Sexual Activity  . Alcohol use: No  . Drug use: Yes    Types: Benzodiazepines, Oxycodone  . Sexual activity: Not Currently  Lifestyle  . Physical activity:    Days per week: Not on file    Minutes per session: Not on file  . Stress: Not on file  Relationships  . Social connections:    Talks on phone: Not on file    Gets together: Not on file    Attends religious service: Not  on file    Active member of club or organization: Not on file    Attends meetings of clubs or organizations: Not on file    Relationship status: Not on file  Other Topics Concern  . Not on file  Social History Narrative  . Not on file    Hospital Course:  Nicholas Harrison was admitted for MDD (major depressive disorder), recurrent severe, without psychosis (HCC)  and crisis management.  Pt was treated discharged with the medications listed below under Medication List.  Medical problems were identified and  treated as needed.  Home medications were restarted as appropriate.  Improvement was monitored by observation and Nicholas Harrison 's daily report of symptom reduction.  Emotional and mental status was monitored by daily self-inventory reports completed by Nicholas Harrison and clinical staff.         Nicholas Harrison was evaluated by the treatment team for stability and plans for continued recovery upon discharge. Nicholas Harrison 's motivation was an integral factor for scheduling further treatment. Employment, transportation, bed availability, health status, family support, and any pending legal issues were also considered during hospital stay. Pt was offered further treatment options upon discharge including but not limited to Residential, Intensive Outpatient, and Outpatient treatment.  Nicholas Harrison will follow up with the services as listed below under Follow Up Information.     Upon completion of this admission the patient was both mentally and medically stable for discharge denying suicidal/homicidal ideation, auditory/visual/tactile hallucinations, delusional thoughts and paranoia.    Nicholas Harrison responded well to treatment withwithout adverse effects. Initially, pt did complain of Lexpro 10 mg and Detox protocol  with these medications, but this resolved. Pt demonstrated improvement without reported or observed adverse effects to the point of stability appropriate for outpatient management. Pertinent labs include: Lipid Panel,A1C for which outpatient follow-up is necessary for lab recheck as mentioned below. Reviewed CBC, CMP, BAL, and UDS; all unremarkable aside from noted exceptions.   Physical Findings: AIMS: Facial and Oral Movements Muscles of Facial Expression: None, normal Lips and Perioral Area: None, normal Jaw: None, normal Tongue: None, normal,Extremity Movements Upper (arms, wrists, hands, fingers): None, normal Lower (legs, knees, ankles, toes): None, normal, Trunk  Movements Neck, shoulders, hips: None, normal, Overall Severity Severity of abnormal movements (highest score from questions above): None, normal Incapacitation due to abnormal movements: None, normal Patient's awareness of abnormal movements (rate only patient's report): No Awareness, Dental Status Current problems with teeth and/or dentures?: No Does patient usually wear dentures?: No  CIWA:  CIWA-Ar Total: 2 COWS:  COWS Total Score: 0  Musculoskeletal: Strength & Muscle Tone: within normal limits Gait & Station: normal Patient leans: N/A  Psychiatric Specialty Exam: See SRA by MD Physical Exam  Review of Systems  Psychiatric/Behavioral: Negative for depression (stable) and suicidal ideas. The patient does not have insomnia.   All other systems reviewed and are negative.   Blood pressure (!) 132/93, pulse 76, temperature 98.2 F (36.8 C), temperature source Oral, resp. rate 18, height 5' 8.5" (1.74 m), weight 70.8 kg.Body mass index is 23.37 kg/m.   Have you used any form of tobacco in the last 30 days? (Cigarettes, Smokeless Tobacco, Cigars, and/or Pipes): No  Has this patient used any form of tobacco in the last 30 days? (Cigarettes, Smokeless Tobacco, Cigars, and/or Pipes) , No  Blood Alcohol level:  Lab Results  Component Value Date   ETH <10 11/07/2017   ETH <10 06/15/2017  Metabolic Disorder Labs:  Lab Results  Component Value Date   HGBA1C 4.7 (L) 11/09/2017   MPG 88.19 11/09/2017   No results found for: PROLACTIN Lab Results  Component Value Date   CHOL 152 11/09/2017   TRIG 79 11/09/2017   HDL 36 (L) 11/09/2017   CHOLHDL 4.2 11/09/2017   VLDL 16 11/09/2017   LDLCALC 100 (H) 11/09/2017    See Psychiatric Specialty Exam and Suicide Risk Assessment completed by Attending Physician prior to discharge.  Discharge destination:  Other:  [facility]  Is patient on multiple antipsychotic therapies at discharge:  No   Has Patient had three or more failed  trials of antipsychotic monotherapy by history:  No  Recommended Plan for Multiple Antipsychotic Therapies: NA  Discharge Instructions    Diet - low sodium heart healthy   Complete by:  As directed    Discharge instructions   Complete by:  As directed    Take all medications as prescribed. Keep all follow-up appointments as scheduled.  Do not consume alcohol or use illegal drugs while on prescription medications. Report any adverse effects from your medications to your primary care provider promptly.  In the event of recurrent symptoms or worsening symptoms, call 911, a crisis hotline, or go to the nearest emergency department for evaluation.   Increase activity slowly   Complete by:  As directed      Allergies as of 11/12/2017      Reactions   Buspar [buspirone] Nausea Only   Dizziness (also)      Medication List    STOP taking these medications   ALPRAZolam 1 MG tablet Commonly known as:  XANAX   atomoxetine 40 MG capsule Commonly known as:  STRATTERA   azithromycin 500 MG tablet Commonly known as:  ZITHROMAX   diazepam 5 MG tablet Commonly known as:  VALIUM   ibuprofen 800 MG tablet Commonly known as:  ADVIL,MOTRIN   propranolol 10 MG tablet Commonly known as:  INDERAL   ZUBSOLV 11.4-2.9 MG Subl Generic drug:  Buprenorphine HCl-Naloxone HCl     TAKE these medications     Indication  cloNIDine 0.1 MG tablet Commonly known as:  CATAPRES Take 1 tablet by mouth 3 (three) times daily.  Indication:  High Blood Pressure Disorder   escitalopram 10 MG tablet Commonly known as:  LEXAPRO Take 1 tablet (10 mg total) by mouth daily. What changed:    medication strength  how much to take  additional instructions  Indication:  Major Depressive Disorder   nicotine 21 mg/24hr patch Commonly known as:  NICODERM CQ - dosed in mg/24 hours Place 1 patch (21 mg total) onto the skin daily.  Indication:  Nicotine Addiction   QUEtiapine 25 MG tablet Commonly known  as:  SEROQUEL Take 3 tablets (75 mg total) by mouth at bedtime. What changed:    how much to take  how to take this  when to take this  additional instructions  Indication:  mood/insomnia      Follow-up Information    [facility] Follow up on 11/13/2017.   Why:  You have been accepted for admission for Monday evening 11/13/17. Please plan to discharge from the hospital Monday morning around 8:30AM. Thank you.  Contact information: [address] [place], [place] [id number] Phone: [phone] Fax: [phone]          Follow-up recommendations:  Activity:  as tolerated Diet:  heart healthy   Comments:  Take all medications as prescribed. Keep all  follow-up appointments as scheduled.  Do not consume alcohol or use illegal drugs while on prescription medications. Report any adverse effects from your medications to your primary care provider promptly.  In the event of recurrent symptoms or worsening symptoms, call 911, a crisis hotline, or go to the nearest emergency department for evaluation.   Signed: Oneta Rack, NP 11/13/2017, 8:53 AM  Patient seen, Suicide Assessment Completed.  Disposition Plan Reviewed

## 2017-11-12 NOTE — [facility] Suicide Risk Assessment (Addendum)
[facility] Discharge Suicide Risk Assessment   Principal Problem: MDD (major depressive disorder), recurrent severe, without psychosis (HCC) Discharge Diagnoses:  Patient Active Problem List   Diagnosis Date Noted  . MDD (major depressive disorder), recurrent severe, without psychosis (HCC) [F33.2] 11/07/2017  . 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 Polysubstance abuse (HCC) [F19.10] 10/19/2016  . 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 Moderate tetrahydrocannabinol (THC) dependence (HCC) [F12.20] 05/31/2016  . Opioid use disorder, severe, dependence (HCC) [F11.20] 10/29/2014  . ADD (attention deficit disorder) [F98.8] 02/15/2012  . Well adult exam [Z00.00] 09/02/2011  . Rash [R21] 09/02/2011  . Chronic anxiety [F41.9] 06/05/2011    Total Time spent with patient: 30 minutes  Musculoskeletal: Strength & Muscle Tone: within normal limits Gait & Station: normal Patient leans: N/A  Psychiatric Specialty Exam: ROS denies chest pain, no shortness of breath , no vomiting , no fever , no chills  Blood pressure 125/86, pulse 62, temperature 97.8 F (36.6 C), resp. rate 16, height 5' 8.5" (1.74 m), weight 70.8 kg.Body mass index is 23.37 kg/m.  General Appearance: improving grooming   Eye Contact::  Good  Speech:  Normal Rate409  Volume:  Normal  Mood:  reports improving mood, states he is feeling "OK" today, denies depression  Affect:  appropriate, reactive  Thought Process:  Linear and Descriptions of Associations: Intact  Orientation:  Full (Time, Place, and Person)  Thought Content:  no hallucinations, no delusions expressed, does not present internally preoccupied   Suicidal Thoughts:  No denies suicidal or self injurious ideations, denies homicidal or violent ideations  Homicidal Thoughts:  No  Memory:  recent and remote grossly intact   Judgement:  Other:  improving   Insight:  improving   Psychomotor  Activity:  Normal  Concentration:  Good  Recall:  Good  Fund of Knowledge:Good  Language: Good  Akathisia:  Negative  Handed:  Right  AIMS (if indicated):     Assets:  Communication Skills Desire for Improvement Resilience  Sleep:  Number of Hours: 6  Cognition: WNL  ADL's:  Intact   Mental Status Per Nursing Assessment::   On Admission:  NA  Demographic Factors:  27 year old male, single, no children, lives alone, employed   Loss Factors: Substance abuse, relapse  Historical Factors: History of depression, anxiety, history of substance use disorder, identifies BZD as substance of choice. History of BZD withdrawal syndromes in the past. No history of suicide attempts   Risk Reduction Factors:   Religious beliefs about death, Positive social support and Positive coping skills or problem solving skills  Continued Clinical Symptoms:  Patient reports improvement compared to admission, and describes improved mood and presents with appropriate , fuller range of affect. No thought disorder, no suicidal , no homicidal or violent ideations, no hallucinations, future oriented . Denies residual WDL symptoms and presents calm, in no acute distress or discomfort - vitals are stable. Denies medication side effects. Presents future oriented, and expresses motivation in sobriety and motivation to go to rehab setting ( [facility] in [place], [place]). States he is scheduled for admission tomorrow,reports his plan is to fly down to [place] after discharge , describes parents as supportive and involved. No disruptive or agitated behaviors on unit, going to groups , pleasant on approach  Cognitive Features That Contribute To Risk:  No gross cognitive deficits noted upon discharge. Is alert , attentive, and oriented x 3   Suicide Risk:  Mild:  Suicidal ideation of limited frequency, intensity, duration, and specificity.  There are no identifiable plans, no associated intent, mild dysphoria and  related symptoms, good self-control (both objective and subjective assessment), few other risk factors, and identifiable protective factors, including available and accessible social support.  Follow-up Information    [facility] Follow up on 11/13/2017.   Why:  You have been accepted for admission for Monday evening 11/13/17. Please plan to discharge from the hospital Monday morning around 8:30AM. Thank you.  Contact information: [address] [place], [place] [id number] Phone: [phone] Fax: [phone]          Plan Of Care/Follow-up recommendations:  Activity:  as tolerated  Diet:  regular Tests:  NA Other:  See below  Patient is reporting improvement and is looking forward to discharge, plans to go to rehab setting at discharge [facility]( [facility] in [place] ). Leaves tomorrow in early AM with plan to fly down to [place] .    Craige CottaFernando A Kameisha Malicki, MD 11/12/2017, 3:19 PM

## 2017-11-12 NOTE — Progress Notes (Signed)
D: Pt was in dayroom upon initial approach.  Pt presents with approrpiate affect and mood.  Describes his day as "good" and reports goal is "staying positive."  Pt reports he is "being picked up tomorrow, 8 o'clock is when my dad is going to be here.  [facility] [place]  Pt reports he feels safe with this discharge plan.  Pt denies SI/HI, denies hallucinations, denies pain.  Pt has been visible in milieu interacting with peers and staff appropriately.  Pt attended evening group.    A: Introduced self to pt.  Actively listened to pt and offered support and encouragement. Medication administered per order.  PRN medication administered for diarrhea and pain.  Q15 minute safety checks maintained.  R: Pt is safe on the unit.  Pt is compliant with medications.  Pt verbally contracts for safety.  Will continue to monitor and assess.

## 2017-11-12 NOTE — Progress Notes (Signed)
D Pt is observed [facility]Ob UAL on the [address] today. HE is bright, talkative and ( admittedly) anxious. He says " I'm starting to get anxious about my discharge tomorrow morning...the last  This SmartLink has not been configured with any valid records.   time [place], while I was waiting n my flight ..  I don't even drink...". He makes good eye contact. HE endoses a flat, depressed affect. He is anxious and he interrupts this Clinical research associatewriter frequently, apologizing each time and saying " " I'm sorry..."      A HE completed his daily assessment and on this he wrote he  Denied SI today and he rated his depression, hopelessness and anxiety " " 3/2/2/",respectively. He is quite verbal in [employer], about his personal feellings and how they have impacted his unhealthy behaviors . He is trying to remian positive and to not focus on things NOT in his control.      R Safety is in plae and pic cont

## 2017-11-13 NOTE — Progress Notes (Signed)
Discharge note: Patient reviewed discharge paperwork with RN including prescriptions, follow up appointments, and lab work. Patient given the opportunity to ask questions. All concerns were addressed. All belongings were returned to patient. Denied SI/HI/AVH. Patient thanked staff for their care while at the hospital.  Patient was discharged to lobby where patient's dad was waiting to pick him up.

## 2017-11-13 NOTE — Progress Notes (Signed)
Patient was pleasant upon approach. Denies SI HI AVH. Patient was only a little nervous about going to rehab in [place]. Denies pain. Patient is compliant with medications prescribed per provider.  Safety is maintained with 15 minute checks. Will continue to monitor.

## 2017-11-13 NOTE — Plan of Care (Signed)
  Problem: Education: Goal: Mental status will improve Outcome: Adequate for Discharge   Problem: Coping: Goal: Ability to demonstrate self-control will improve Outcome: Adequate for Discharge   Problem: Health Behavior/Discharge Planning: Goal: Identification of resources available to assist in meeting health care needs will improve Outcome: Adequate for Discharge   Problem: Health Behavior/Discharge Planning: Goal: Compliance with treatment plan for underlying cause of condition will improve Outcome: Adequate for Discharge   Problem: Coping: Goal: Ability to verbalize frustrations and anger appropriately will improve Outcome: Adequate for Discharge

## 2018-01-17 DIAGNOSIS — Z5181 Encounter for therapeutic drug level monitoring: Secondary | ICD-10-CM | POA: Diagnosis not present

## 2018-01-17 DIAGNOSIS — Z23 Encounter for immunization: Secondary | ICD-10-CM | POA: Diagnosis not present

## 2018-02-16 ENCOUNTER — Encounter (HOSPITAL_COMMUNITY): Payer: Self-pay | Admitting: Emergency Medicine

## 2018-02-16 ENCOUNTER — Emergency Department (HOSPITAL_COMMUNITY)
Admission: EM | Admit: 2018-02-16 | Discharge: 2018-02-16 | Disposition: A | Discharge status: Home / Self Care | Payer: BLUE CROSS/BLUE SHIELD | Attending: Physician Assistant | Admitting: Physician Assistant

## 2018-02-16 DIAGNOSIS — Z79899 Other long term (current) drug therapy: Secondary | ICD-10-CM | POA: Diagnosis not present

## 2018-02-16 DIAGNOSIS — M542 Cervicalgia: Secondary | ICD-10-CM | POA: Diagnosis not present

## 2018-02-16 DIAGNOSIS — Z87891 Personal history of nicotine dependence: Secondary | ICD-10-CM | POA: Insufficient documentation

## 2018-02-16 MED ORDER — METHOCARBAMOL 500 MG PO TABS: 500.0000 mg | ORAL_TABLET | Freq: Two times a day (BID) | ORAL | 0 refills | Status: DC
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
## 2018-02-16 NOTE — ED Notes (Signed)
Patient verbalized understanding of discharge instructions and denies any further needs or questions at this time. VS stable. Patient ambulatory with steady gait.  
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
## 2018-02-16 NOTE — Discharge Instructions (Signed)
Please read attached information. If you experience any new or worsening signs or symptoms please return to the emergency room for evaluation. Please follow-up with your primary care provider or specialist as discussed. Please use medication prescribed only as directed and discontinue taking if you have any concerning signs or symptoms.   °

## 2018-03-06 DIAGNOSIS — Z79899 Other long term (current) drug therapy: Secondary | ICD-10-CM | POA: Diagnosis not present

## 2018-03-06 DIAGNOSIS — F1199 Opioid use, unspecified with unspecified opioid-induced disorder: Secondary | ICD-10-CM | POA: Diagnosis not present

## 2018-07-18 ENCOUNTER — Emergency Department (HOSPITAL_COMMUNITY)
Admission: EM | Admit: 2018-07-18 | Discharge: 2018-07-19 | Disposition: A | Discharge status: Home / Self Care | Payer: BLUE CROSS/BLUE SHIELD | Attending: Emergency Medicine | Admitting: Emergency Medicine

## 2018-07-18 ENCOUNTER — Emergency Department (HOSPITAL_COMMUNITY): Payer: BLUE CROSS/BLUE SHIELD

## 2018-07-18 ENCOUNTER — Encounter (HOSPITAL_COMMUNITY): Payer: Self-pay | Admitting: *Deleted

## 2018-07-18 ENCOUNTER — Other Ambulatory Visit: Payer: Self-pay

## 2018-07-18 DIAGNOSIS — F1920 Other psychoactive substance dependence, uncomplicated: Secondary | ICD-10-CM | POA: Diagnosis not present

## 2018-07-18 DIAGNOSIS — F1210 Cannabis abuse, uncomplicated: Secondary | ICD-10-CM

## 2018-07-18 DIAGNOSIS — Z046 Encounter for general psychiatric examination, requested by authority: Secondary | ICD-10-CM | POA: Diagnosis not present

## 2018-07-18 DIAGNOSIS — F1910 Other psychoactive substance abuse, uncomplicated: Secondary | ICD-10-CM | POA: Diagnosis not present

## 2018-07-18 DIAGNOSIS — R462 Strange and inexplicable behavior: Secondary | ICD-10-CM | POA: Diagnosis not present

## 2018-07-18 DIAGNOSIS — F329 Major depressive disorder, single episode, unspecified: Secondary | ICD-10-CM | POA: Diagnosis not present

## 2018-07-18 DIAGNOSIS — F17220 Nicotine dependence, chewing tobacco, uncomplicated: Secondary | ICD-10-CM | POA: Insufficient documentation

## 2018-07-18 DIAGNOSIS — I4510 Unspecified right bundle-branch block: Secondary | ICD-10-CM | POA: Insufficient documentation

## 2018-07-18 DIAGNOSIS — G40909 Epilepsy, unspecified, not intractable, without status epilepticus: Secondary | ICD-10-CM | POA: Diagnosis not present

## 2018-07-18 DIAGNOSIS — E876 Hypokalemia: Secondary | ICD-10-CM | POA: Diagnosis not present

## 2018-07-18 DIAGNOSIS — Z79899 Other long term (current) drug therapy: Secondary | ICD-10-CM | POA: Insufficient documentation

## 2018-07-18 DIAGNOSIS — R569 Unspecified convulsions: Secondary | ICD-10-CM | POA: Diagnosis not present

## 2018-07-18 DIAGNOSIS — F909 Attention-deficit hyperactivity disorder, unspecified type: Secondary | ICD-10-CM | POA: Insufficient documentation

## 2018-07-18 HISTORY — DX: Unspecified convulsions: T50.905A

## 2018-07-18 HISTORY — DX: Unspecified convulsions: R56.9

## 2018-07-18 HISTORY — DX: Anxiety disorder, unspecified: F41.9

## 2018-07-18 HISTORY — DX: Major depressive disorder, single episode, unspecified: F32.9

## 2018-07-18 HISTORY — DX: Depression, unspecified: F32.A

## 2018-07-18 LAB — COMPREHENSIVE METABOLIC PANEL
ALT: 18 U/L (ref 0–44)
AST: 23 U/L (ref 15–41)
Albumin: 5.1 g/dL — ABNORMAL HIGH (ref 3.5–5.0)
Alkaline Phosphatase: 51 U/L (ref 38–126)
Anion gap: 17 — ABNORMAL HIGH (ref 5–15)
BUN: 13 mg/dL (ref 6–20)
CO2: 17 mmol/L — ABNORMAL LOW (ref 22–32)
Calcium: 9.8 mg/dL (ref 8.9–10.3)
Chloride: 104 mmol/L (ref 98–111)
Creatinine, Ser: 1.04 mg/dL (ref 0.61–1.24)
GFR calc Af Amer: >60 mL/min (ref >60)
GFR calc non Af Amer: >60 mL/min (ref >60)
Glucose, Bld: 200 mg/dL — ABNORMAL HIGH (ref 70–99)
Potassium: 2.5 mmol/L — CL (ref 3.5–5.1)
Sodium: 138 mmol/L (ref 135–145)
Total Bilirubin: 0.8 mg/dL (ref 0.3–1.2)
Total Protein: 8.3 g/dL — ABNORMAL HIGH (ref 6.5–8.1)

## 2018-07-18 LAB — URINALYSIS, ROUTINE W REFLEX MICROSCOPIC
Bacteria, UA: NONE SEEN
Bilirubin Urine: NEGATIVE
Glucose, UA: NEGATIVE mg/dL
Hgb urine dipstick: NEGATIVE
Ketones, ur: 20 mg/dL — AB
Leukocytes,Ua: NEGATIVE
Nitrite: NEGATIVE
Protein, ur: 30 mg/dL — AB
Specific Gravity, Urine: 1.018 (ref 1.005–1.030)
pH: 7 (ref 5.0–8.0)

## 2018-07-18 LAB — BLOOD GAS, VENOUS
Acid-base deficit: 7.3 mmol/L — ABNORMAL HIGH (ref 0.0–2.0)
Bicarbonate: 19 mmol/L — ABNORMAL LOW (ref 20.0–28.0)
FIO2: 21
O2 Saturation: 93.2 %
Patient temperature: 37.5
pCO2, Ven: 30.2 mmHg — ABNORMAL LOW (ref 44.0–60.0)
pH, Ven: 7.368 (ref 7.250–7.430)
pO2, Ven: 73 mmHg — ABNORMAL HIGH (ref 32.0–45.0)

## 2018-07-18 LAB — RAPID URINE DRUG SCREEN, HOSP PERFORMED
Amphetamines: NOT DETECTED
Barbiturates: NOT DETECTED
Benzodiazepines: NOT DETECTED
Cocaine: NOT DETECTED
Opiates: NOT DETECTED
Tetrahydrocannabinol: POSITIVE — AB

## 2018-07-18 LAB — CBC WITH DIFFERENTIAL/PLATELET
Abs Immature Granulocytes: 0.03 10*3/uL (ref 0.00–0.07)
Basophils Absolute: 0 10*3/uL (ref 0.0–0.1)
Basophils Relative: 1 %
Eosinophils Absolute: 0 10*3/uL (ref 0.0–0.5)
Eosinophils Relative: 1 %
HCT: 44.7 % (ref 39.0–52.0)
Hemoglobin: 15.6 g/dL (ref 13.0–17.0)
Immature Granulocytes: 1 %
Lymphocytes Relative: 24 %
Lymphs Abs: 1.4 10*3/uL (ref 0.7–4.0)
MCH: 29.3 pg (ref 26.0–34.0)
MCHC: 34.9 g/dL (ref 30.0–36.0)
MCV: 84 fL (ref 80.0–100.0)
Monocytes Absolute: 0.4 10*3/uL (ref 0.1–1.0)
Monocytes Relative: 7 %
Neutro Abs: 4 10*3/uL (ref 1.7–7.7)
Neutrophils Relative %: 66 %
Platelets: 247 10*3/uL (ref 150–400)
RBC: 5.32 MIL/uL (ref 4.22–5.81)
RDW: 13.7 % (ref 11.5–15.5)
WBC: 6 10*3/uL (ref 4.0–10.5)
nRBC: 0 % (ref 0.0–0.2)

## 2018-07-18 LAB — ACETAMINOPHEN LEVEL: Acetaminophen (Tylenol), Serum: <10 ug/mL — ABNORMAL LOW (ref 10–30)

## 2018-07-18 LAB — SALICYLATE LEVEL: Salicylate Lvl: <7 mg/dL (ref 2.8–30.0)

## 2018-07-18 LAB — AMMONIA: Ammonia: 57 umol/L — ABNORMAL HIGH (ref 9–35)

## 2018-07-18 LAB — ETHANOL: Alcohol, Ethyl (B): <10 mg/dL (ref <10)

## 2018-07-18 MED ORDER — HYDROGEN PEROXIDE 3 % EX SOLN
CUTANEOUS | Status: AC
  Filled 2018-07-18: qty 473

## 2018-07-18 MED ORDER — LORAZEPAM 2 MG/ML IJ SOLN
2.0000 mg | Freq: Once | INTRAMUSCULAR | Status: AC
  Administered 2018-07-18: 2 mg via INTRAMUSCULAR

## 2018-07-18 MED ORDER — POTASSIUM CHLORIDE CRYS ER 20 MEQ PO TBCR
40.0000 meq | EXTENDED_RELEASE_TABLET | Freq: Once | ORAL | Status: AC
  Administered 2018-07-18: 40 meq via ORAL
  Filled 2018-07-18: qty 2

## 2018-07-18 MED ORDER — LORAZEPAM 2 MG/ML IJ SOLN
INTRAMUSCULAR | Status: AC
  Filled 2018-07-18: qty 1

## 2018-07-18 MED ORDER — ZIPRASIDONE MESYLATE 20 MG IM SOLR
20.0000 mg | Freq: Once | INTRAMUSCULAR | Status: AC
  Administered 2018-07-18: 20 mg via INTRAMUSCULAR

## 2018-07-18 MED ORDER — ZIPRASIDONE MESYLATE 20 MG IM SOLR
INTRAMUSCULAR | Status: AC
  Filled 2018-07-18: qty 20

## 2018-07-18 MED ORDER — STERILE WATER FOR INJECTION IJ SOLN
INTRAMUSCULAR | Status: AC
  Filled 2018-07-18: qty 10

## 2018-07-18 NOTE — ED Notes (Signed)
Ed with pt's mother in waiting room.  This RN talked to mother. Mother states " He didn't seem off on Monday because he gave me money to put away but he stays with his dad and his dad said he was on xanax on Monday so I really dont know how he got any pills.  This doesn't seem like xanax or pain pills because he was talking out his head"  Mother comforted and updated about patient's status.

## 2018-07-18 NOTE — ED Notes (Signed)
Pt has been medically cleared by Dr Effie Shy, confirmed by Dr Deretha Emory- monitoring devices and cables removed from pt room
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
## 2018-07-18 NOTE — ED Notes (Signed)
Pt returned from CT °
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
## 2018-07-19 DIAGNOSIS — F1920 Other psychoactive substance dependence, uncomplicated: Secondary | ICD-10-CM

## 2018-07-19 DIAGNOSIS — R462 Strange and inexplicable behavior: Secondary | ICD-10-CM

## 2018-07-19 MED ORDER — QUETIAPINE FUMARATE 25 MG PO TABS: 75.0000 mg | ORAL_TABLET | Freq: Every day | ORAL | Status: DC

## 2018-07-19 MED ORDER — FLUOXETINE HCL 20 MG PO CAPS
20.0000 mg | ORAL_CAPSULE | Freq: Every day | ORAL | Status: DC
  Administered 2018-07-19: 20 mg via ORAL
  Filled 2018-07-19: qty 1

## 2018-07-19 NOTE — ED Notes (Signed)
Donell Sievert, PA, patient meets inpatient criteria. TTS to secure placement. Claris Gower, RN, informed of disposition. Clinician attempted collateral contact, no answer, will attempt at later time for additional information.

## 2018-07-19 NOTE — Consult Note (Signed)
Telepsych Consultation   Reason for Consult: Patient exhibiting bizarre behaviors Referring Physician: EDP [facility] of [facility]: [facility] Location of Provider: [facility]  Patient Identification: Nicholas Harrison MRN:  [id number] Principal Diagnosis: <principal problem not specified> Diagnosis:  Active Problems:   * No active hospital problems. *   Total Time spent with patient: 30 minutes  Subjective:   Nicholas Harrison is a 28 y.o. male patient admitted to [facility], ER for bizarre behaviors on an IVC.  Patient reports that he passed out, most likely had a seizure, reports that he has a history of drug use.  Patient has that he is been in the substance use IOP at [facility] in the past, found it helpful and feels that he needs to be in the program again.  Patient reports that he is not suicidal, homicidal or psychotic.  Patient has that he knows he needs outpatient continue treatment for substance use disorder and is willing to get that help  Patient denies any psychotic symptoms, reports that he is doing fairly okay in regards to his depression and can keep himself safe Past Psychiatric History: History of substance use treatment Risk to Self: Suicidal Ideation: No Suicidal Intent: No Is patient at risk for suicide?: No Suicidal Plan?: No Access to Means: No What has been your use of drugs/alcohol within the last 12 months?: (marijuana) How many times?: (0) Other Self Harm Risks: (n/a) Triggers for Past Attempts: (n/a) Intentional Self Injurious Behavior: None Risk to Others: Homicidal Ideation: No Thoughts of Harm to Others: No Current Homicidal Intent: No Current Homicidal Plan: No Access to Homicidal Means: No Identified Victim: (n/a) History of harm to others?: No Assessment of Violence: None Noted Violent Behavior Description: (n/a) Does patient have access to weapons?: No Criminal Charges Pending?: No Does patient have a court date: No Prior  Inpatient Therapy: Prior Inpatient Therapy: No Prior Outpatient Therapy: Prior Outpatient Therapy: No Does patient have an ACCT team?: No Does patient have Intensive In-House Services?  : No Does patient have [facility] services? : No Does patient have P4CC services?: No  Past Medical History:  Past Medical History:  Diagnosis Date  . Anxiety   . Depression   . Drug-induced seizure (HCC)   . Seizures (HCC)     Past Surgical History:  Procedure Laterality Date  . HAND SURGERY     Family History:  Family History  Problem Relation Age of Onset  . Depression Mother        anxiety  . Diabetes Maternal Grandfather    Family Psychiatric  History: History of depression in his mother Social History:  Social History   Substance and Sexual Activity  Alcohol Use Yes   Comment: occasionally      Social History   Substance and Sexual Activity  Drug Use Yes  . Types: Benzodiazepines, Oxycodone   Comment: pt reports he takes Oxycodone and Xanax, last used 2 weeks ago as of 07/18/18    Social History   Socioeconomic History  . Marital status: Single    Spouse name: Not on file  . Number of children: Not on file  . Years of education: Not on file  . Highest education level: Not on file  Occupational History  . Not on file  Social Needs  . Financial resource strain: Not on file  . Food insecurity:    Worry: Not on file    Inability: Not on file  . Transportation needs:  Medical: Not on file    Non-medical: Not on file  Tobacco Use  . Smoking status: Current Some Day Smoker  . Smokeless tobacco: Former Neurosurgeon    Types: Chew  . Tobacco comment: only experimented  Substance and Sexual Activity  . Alcohol use: Yes    Comment: occasionally   . Drug use: Yes    Types: Benzodiazepines, Oxycodone    Comment: pt reports he takes Oxycodone and Xanax, last used 2 weeks ago as of 07/18/18  . Sexual activity: Not Currently  Lifestyle  . Physical activity:    Days per week: Not on  file    Minutes per session: Not on file  . Stress: Not on file  Relationships  . Social connections:    Talks on phone: Not on file    Gets together: Not on file    Attends religious service: Not on file    Active member of club or organization: Not on file    Attends meetings of clubs or organizations: Not on file    Relationship status: Not on file  Other Topics Concern  . Not on file  Social History Narrative  . Not on file   Additional Social History:    Allergies:   Allergies  Allergen Reactions  . Buspar [Buspirone] Nausea Only    Dizziness (also)    Labs:  Results for orders placed or performed during the hospital encounter of 07/18/18 (from the past 48 hour(s))  Urinalysis, Routine w reflex microscopic     Status: Abnormal   Collection Time: 07/18/18  5:03 PM  Result Value Ref Range   Color, Urine YELLOW YELLOW   APPearance CLEAR CLEAR   Specific Gravity, Urine 1.018 1.005 - 1.030   pH 7.0 5.0 - 8.0   Glucose, UA NEGATIVE NEGATIVE mg/dL   Hgb urine dipstick NEGATIVE NEGATIVE   Bilirubin Urine NEGATIVE NEGATIVE   Ketones, ur 20 (A) NEGATIVE mg/dL   Protein, ur 30 (A) NEGATIVE mg/dL   Nitrite NEGATIVE NEGATIVE   Leukocytes,Ua NEGATIVE NEGATIVE   RBC / HPF 0-5 0 - 5 RBC/hpf   Bacteria, UA NONE SEEN NONE SEEN   Mucus PRESENT    Hyaline Casts, UA PRESENT    Sperm, UA PRESENT     Comment: Performed at [facility], [address], [place], [place] [address]  Urine rapid drug screen (hosp performed)     Status: Abnormal   Collection Time: 07/18/18  5:03 PM  Result Value Ref Range   Opiates NONE DETECTED NONE DETECTED   Cocaine NONE DETECTED NONE DETECTED   Benzodiazepines NONE DETECTED NONE DETECTED   Amphetamines NONE DETECTED NONE DETECTED   Tetrahydrocannabinol POSITIVE (A) NONE DETECTED   Barbiturates NONE DETECTED NONE DETECTED    Comment: (NOTE) DRUG SCREEN FOR MEDICAL PURPOSES ONLY.  IF CONFIRMATION IS NEEDED FOR ANY PURPOSE, NOTIFY LAB WITHIN 5  DAYS. LOWEST DETECTABLE LIMITS FOR URINE DRUG SCREEN Drug Class                     Cutoff (ng/mL) Amphetamine and metabolites    1000 Barbiturate and metabolites    200 Benzodiazepine                 200 Tricyclics and metabolites     300 Opiates and metabolites        300 Cocaine and metabolites        300 THC  50 Performed at [facility], [address], [place], [place] [address]   Comprehensive metabolic panel     Status: Abnormal   Collection Time: 07/18/18  5:27 PM  Result Value Ref Range   Sodium 138 135 - 145 mmol/L   Potassium 2.5 (LL) 3.5 - 5.1 mmol/L    Comment: CRITICAL RESULT CALLED TO, READ BACK BY AND VERIFIED WITH: PATRAW,B ON 07/18/18 AT 1840 BY LOY,C    Chloride 104 98 - 111 mmol/L   CO2 17 (L) 22 - 32 mmol/L   Glucose, Bld 200 (H) 70 - 99 mg/dL   BUN 13 6 - 20 mg/dL   Creatinine, Ser 4.85 0.61 - 1.24 mg/dL   Calcium 9.8 8.9 - 46.2 mg/dL   Total Protein 8.3 (H) 6.5 - 8.1 g/dL   Albumin 5.1 (H) 3.5 - 5.0 g/dL   AST 23 15 - 41 U/L   ALT 18 0 - 44 U/L   Alkaline Phosphatase 51 38 - 126 U/L   Total Bilirubin 0.8 0.3 - 1.2 mg/dL   GFR calc non Af Amer >60 >60 mL/min   GFR calc Af Amer >60 >60 mL/min   Anion gap 17 (H) 5 - 15    Comment: Performed at [facility], [address] [place], [place] [address]  CBC with Differential     Status: None   Collection Time: 07/18/18  5:27 PM  Result Value Ref Range   WBC 6.0 4.0 - 10.5 K/uL   RBC 5.32 4.22 - 5.81 MIL/uL   Hemoglobin 15.6 13.0 - 17.0 g/dL   HCT 09.3 81.8 - 29.9 %   MCV 84.0 80.0 - 100.0 fL   MCH 29.3 26.0 - 34.0 pg   MCHC 34.9 30.0 - 36.0 g/dL   RDW 37.1 69.6 - 78.9 %   Platelets 247 150 - 400 K/uL   nRBC 0.0 0.0 - 0.2 %   Neutrophils Relative % 66 %   Neutro Abs 4.0 1.7 - 7.7 K/uL   Lymphocytes Relative 24 %   Lymphs Abs 1.4 0.7 - 4.0 K/uL   Monocytes Relative 7 %   Monocytes Absolute 0.4 0.1 - 1.0 K/uL   Eosinophils Relative 1 %   Eosinophils Absolute 0.0 0.0 - 0.5  K/uL   Basophils Relative 1 %   Basophils Absolute 0.0 0.0 - 0.1 K/uL   Immature Granulocytes 1 %   Abs Immature Granulocytes 0.03 0.00 - 0.07 K/uL    Comment: Performed at [facility], [address] [place], [place] [address]  Ethanol     Status: None   Collection Time: 07/18/18  5:28 PM  Result Value Ref Range   Alcohol, Ethyl (B) <10 <10 mg/dL    Comment: (NOTE) Lowest detectable limit for serum alcohol is 10 mg/dL. For medical purposes only. Performed at [facility], [address] [place], [place] [address]   Acetaminophen level     Status: Abnormal   Collection Time: 07/18/18  5:28 PM  Result Value Ref Range   Acetaminophen (Tylenol), Serum <10 (L) 10 - 30 ug/mL    Comment: (NOTE) Therapeutic concentrations vary significantly. A range of 10-30 ug/mL  may be an effective concentration for many patients. However, some  are best treated at concentrations outside of this range. Acetaminophen concentrations >150 ug/mL at 4 hours after ingestion  and >50 ug/mL at 12 hours after ingestion are often associated with  toxic reactions. Performed at [facility], [address], [place], [place] [address]   Salicylate  level     Status: None   Collection Time: 07/18/18  5:28 PM  Result Value Ref Range   Salicylate Lvl <7.0 2.8 - 30.0 mg/dL    Comment: Performed at [facility], [address] [place], [place] [address]  Ammonia     Status: Abnormal   Collection Time: 07/18/18  5:28 PM  Result Value Ref Range   Ammonia 57 (H) 9 - 35 umol/L    Comment: Performed at [facility], [address] [place], [place] [address]  Blood gas, venous     Status: Abnormal   Collection Time: 07/18/18  5:28 PM  Result Value Ref Range   FIO2 21.00    pH, Ven 7.368 7.250 - 7.430   pCO2, Ven 30.2 (L) 44.0 - 60.0 mmHg   pO2, Ven 73.0 (H) 32.0 - 45.0 mmHg   Bicarbonate 19.0 (L) 20.0 - 28.0 mmol/L   Acid-base deficit 7.3 (H) 0.0 - 2.0 mmol/L   O2 Saturation 93.2 %   Patient temperature 37.5      Comment: Performed at [facility], [address] [place], [place] [address]    Medications:  Current Facility-Administered Medications  Medication Dose Route Frequency Provider Last Rate Last Dose  . FLUoxetine (PROZAC) capsule 20 mg  20 mg Oral Daily Derwood KaplanNanavati, Ankit, MD   20 mg at 07/19/18 0919  . QUEtiapine (SEROQUEL) tablet 75 mg  75 mg Oral QHS Derwood KaplanNanavati, Ankit, MD       Current Outpatient Medications  Medication Sig Dispense Refill  . FLUoxetine (PROZAC) 20 MG tablet Take 20 mg by mouth daily.    . QUEtiapine (SEROQUEL) 25 MG tablet Take 3 tablets (75 mg total) by mouth at bedtime. 90 tablet 0  . cloNIDine (CATAPRES) 0.1 MG tablet Take 1 tablet by mouth 3 (three) times daily.  0    Musculoskeletal: Strength & Muscle Tone: within normal limits Gait & Station: normal Patient leans: N/A  Psychiatric Specialty Exam: Physical Exam  ROS  Blood pressure 127/71, pulse 95, temperature 98.1 F (36.7 C), temperature source Oral, resp. rate 17, height 5\' 11"  (1.803 m), weight 81.6 kg, SpO2 97 %.Body mass index is 25.1 kg/m.  General Appearance: Casual  Eye Contact:  Fair  Speech:  Clear and Coherent and Normal Rate  Volume:  Normal  Mood:  Euthymic  Affect:  Congruent and Full Range  Thought Process:  Coherent, Goal Directed and Descriptions of Associations: Intact  Orientation:  Full (Time, Place, and Person)  Thought Content:  WDL and Logical  Suicidal Thoughts:  No  Homicidal Thoughts:  No  Memory:  Immediate;   Fair Recent;   Fair Remote;   Fair  Judgement:  Poor  Insight:  Present  Psychomotor Activity:  Normal  Concentration:  Concentration: Fair and Attention Span: Fair  Recall:  FiservFair  Fund of Knowledge:  Fair  Language:  Fair  Akathisia:  No  Handed:  Right  AIMS (if indicated):     Assets:  Desire for Improvement Financial Resources/Insurance Housing Leisure Time Physical Health Social Support Talents/Skills  ADL's:  Intact  Cognition:  WNL  Sleep:         Treatment Plan Summary: Plan Patient does not need inpatient psychiatric care  Disposition: No evidence of imminent risk to self or others at present.   Patient does not meet criteria for psychiatric inpatient admission. Supportive therapy provided about ongoing stressors. Refer to IOP.  This service was provided via telemedicine using a 2-way, interactive audio and  Immunologist.  Names of all persons participating in this telemedicine service and their role in this encounter. Name: Ovidio Kin Role: Psychiatry    Nelly Rout, MD 07/19/2018 10:23 AM
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
## 2018-07-19 NOTE — BH Assessment (Signed)
BHH Assessment Progress Note   Patient was seen by TTS for reassessment.  Patient currently denies SI/HI?Psychosis, but admits that he does have a drug problem.  Patient states that he has been in drug rehab on three occasions in the past and states that he has the tools that he needs to recover.  Patient states that he would like to be discharged home and does not feel like he needs any psychiatric intervention.  Dr. Lucianne Muss to see for final disposition.

## 2018-07-19 NOTE — Discharge Instructions (Addendum)
We saw you in the ER for your mental health concerns and had our behavioral health team evaluate you. The team feels comfortable sending you home, please follow the recommendations given to you by them and the follow-up they have recommended to you. Please refrain from substance abuse. Return to the ER if your symptoms worsen.    Substance Abuse Treatment Programs  Intensive Outpatient Programs [facility]     [address]      [place], [place]                   [phone]       [facility] [address] [place], [place] [phone]  [facility]     (Inpatient and outpatient)     [address]           [phone]    [facility] [phone] (Suboxone and Methadone)  [address]      [place], [place] [address]      [phone]       [address] [place], [place] [phone]  [facility] (Outpatient/Inpatient, [facility])    (insurance only) [phone]             [facility] (Groups & Residential) [place], [place] [phone]     [facility]     [address]     [place], [place]      [phone]       [facility] (for caregivers and family) [phone] [address] [place], [place] [phone]      Residential Treatment Programs [facility]      [address], [place], [place] [address]  [phone]       [facility] [address] [place], [place] [address] [phone]  [facility]        [phone]       [facility] [phone]  [facility]             [address]                                         [place], [place]                                                [phone] or [phone]                               [facility] [place] [address] [place], [phone]  [facility]    [address]      [place], [place]     [phone]       [facility] [address] [place], [place] [phone]  [facility]   [address]     [place], [place] [address]     [phone]      Admissions: 8am-3pm M-F  [facility] ([facility]) [address] [place], [place] [phone]  [facility]: Residential Program [phone](90 Days)   [place], [place]      [phone] or [phone]     [facility]: [facility]  [facility] [place], [place] (Walk in Hours over the weekend or by referral)  [facility] [address] [place], [place], [place] [address] [phone]  Crisis Mobile: [facility]:  [phone] (for crisis response 24 hours a day) [facility] Hotline:      8067264079 Outpatient Psychiatry and Counseling  [facility]: Mobile Crisis Management 24 hours:  [phone]  [facility] [employer] sliding scale fee and walk in schedule: M-F 8am-12pm/1pm-3pm [address]  [place], [place] [address] [phone]  [facility] [address] [place], [place] [address] [phone]  [facility] [employer] new patient walk-in appointments available Monday - Friday 8am -3pm.          [address] [place], [place] [address] [phone] or crisis line- [phone]  [facility] Services/ Intensive Outpatient Therapy Program [address] [place], [place] [address] [phone]  [facility]                  Crisis Services      [phone] [address]     [place], [place] [address]                 [place] [facility]   [facility] [phone]. [address] [place], [place] [address]   [employer] [facility] Care          [address] Laura  [place], [place] [address]       [phone]  [facility] [address] [place], [place] [address] [phone]  [facility]    [address]    [place], [place] [address]     [phone]       Andee Poles, MD     [address]     [place] [address]     [phone]       [facility] [address] [place] [address]  [facility]     [address] [place], [place]      [phone]       [facility] Eulogio Ditch, MD [address] [place], [place] [address] [phone]  [facility]     [address]     [place], [place] [address]     [phone]       [facility] [address] [place], [place] [address] [phone] Resources for Temporary Residential Assistance/Crisis Centers  [facility]) M-F 8am-3pm   [address] [place], [place] [address]   [phone] Services include: laundry, barbering, support groups, case management, phone  & computer access, showers, AA/NA mtgs, mental health/substance abuse nurse, job skills class, disability information, VA assistance, spiritual classes, etc.   HOMELESS SHELTERS  [facility]     [facility]   [address] [place]     [phone]              [employer] (women and children)       [address] [place], [place] [address] [phone] [email or web address] for application and process Application Required  [facility]   [address]    [place] [address]     [phone]  [place], [place] [address] [phone]([email or web address] application appt.) Application Required  [facility] (women only)    [address]     [place], [place] [address]     [phone]      Intake starts 6pm daily Need valid ID, SSC, & Police  report [employer] [address] [place], [place] [phone] Application Required  [employer] (men only)     [address]      [place], [place]       [place] (Pregnant women only) [address] [place], [place]  [facility]      [place] [address]      [place], [place] [address]     [phone]             [facility] [address] [place], [place] 90 day commitment/SA/Application process  [facility](men only)     [address]     [place], [place]       Check-in at [facility] [address] [place], [place] [address] [phone] Men/Women/Women and Children must be there by 7 pm  [place], [place]

## 2018-07-19 NOTE — ED Notes (Signed)
Collateral Contact: Clinician attempted to contact petitioner, Caralyn Guile, mother, [phone]. No answer.

## 2018-07-19 NOTE — ED Notes (Signed)
Rescinded IVC papers faxed to Magistrate, prior to D/C.

## 2018-07-19 NOTE — ED Notes (Signed)
BH counselor called to report pt meets inpatient criteria.

## 2018-07-19 NOTE — ED Provider Notes (Signed)
Patient was assessed by TTS and then psychiatry team.  Psychiatry team has contacted appropriate contact with the patient.  After patient's independent assessment, psychiatry team is cleared him for discharge.  I reassessed the patient.  He denies any SI.  He reports that he would immediately return to the ER if he starts having thoughts of hurting himself or anyone else.  Patient does not want any inpatient help with his substance abuse.  IVC paperwork has been rescinded.  Stable for discharge. No concerns from nursing staff.  Vitals:   07/18/18 2100 07/19/18 0600  BP: 114/72 127/71  Pulse: 81 95  Resp: 17 17  Temp:  98.1 F (36.7 C)  SpO2: 97% 97%      Derwood Kaplan, MD 07/19/18 1110

## 2018-07-19 NOTE — BH Assessment (Signed)
[facility] Assessment Progress Note    TTS spoke to patient's mother, Azan Faver [phone], who states that patient has a substance abuse problem.  She states that patient was at [facility] in August 2019 and then went to a treatment program in [place].  She states that he was clean for three months prior to relapsing on opioids and benzos.  She states that he has been to three different thirty day programs trying to overcome his addiction.  She states that patient has indicated that he is tired of using and states that he has been trying to come off the benzos, but he had a withdrawal seizure yesterday.  She states that he has has also been using subutex here and there.  Mother states that patient has never been suicidal, homicidal or psychotic.  She states that patient needs longer term treatment for his addiction.  Mother states that she is in recovery and realizes that patient needs to want to help himself and she states that she knows that she cannot do this for him.

## 2018-07-24 ENCOUNTER — Ambulatory Visit (INDEPENDENT_AMBULATORY_CARE_PROVIDER_SITE_OTHER): Payer: BLUE CROSS/BLUE SHIELD | Admitting: Internal Medicine

## 2018-07-24 ENCOUNTER — Encounter: Payer: Self-pay | Admitting: Internal Medicine

## 2018-07-24 DIAGNOSIS — F1120 Opioid dependence, uncomplicated: Secondary | ICD-10-CM

## 2018-07-24 DIAGNOSIS — F419 Anxiety disorder, unspecified: Secondary | ICD-10-CM

## 2018-07-24 DIAGNOSIS — F1320 Sedative, hypnotic or anxiolytic dependence, uncomplicated: Secondary | ICD-10-CM | POA: Diagnosis not present

## 2018-07-24 DIAGNOSIS — G40909 Epilepsy, unspecified, not intractable, without status epilepticus: Secondary | ICD-10-CM | POA: Diagnosis not present

## 2018-07-24 DIAGNOSIS — F1910 Other psychoactive substance abuse, uncomplicated: Secondary | ICD-10-CM

## 2018-07-24 MED ORDER — BUSPIRONE HCL 10 MG PO TABS: 10.0000 mg | ORAL_TABLET | Freq: Two times a day (BID) | ORAL | 3 refills | Status: DC

## 2018-07-24 MED ORDER — VITAMIN D3 50 MCG (2000 UT) PO CAPS: 2000.0000 [IU] | ORAL_CAPSULE | Freq: Every day | ORAL | 3 refills | Status: DC

## 2018-07-24 MED ORDER — CLONIDINE HCL 0.1 MG PO TABS: 0.1000 mg | ORAL_TABLET | Freq: Three times a day (TID) | ORAL | 0 refills | Status: DC | PRN

## 2018-07-24 NOTE — Assessment & Plan Note (Signed)
Relapsing, chronic  5/20 Status post a recent emergency room visit for what was thought was drug withdrawal (benzodiazepine withdrawal) seizure with postictal agitation and confusion.

## 2018-07-24 NOTE — Assessment & Plan Note (Addendum)
Relapsing, chronic  5/20 Status post a recent emergency room visit for what was thought was drug withdrawal (benzodiazepine withdrawal) seizure with postictal agitation and confusion.  She has a urine drug scree screen positive was he has urine drug screen was positive for THC only

## 2018-07-24 NOTE — Progress Notes (Signed)
Virtual Visit via Video Note  I connected with Nicholas Harrison on 07/24/18 at 11:20 AM EDT by a video enabled telemedicine application and verified that I am speaking with the correct person using two identifiers.   I discussed the limitations of evaluation and management by telemedicine and the availability of in person appointments. The patient expressed understanding and agreed to proceed.  History of Present Illness: We need to follow-up on the patient's emergency room visit for what was thought was drug withdrawal (benzodiazepine withdrawal) seizure with postictal agitation and confusion.  The patient was working with his dad when it happened.  He was evaluated by psychiatry and discharged.  The patient thinks he was withdrawing from Xanax and opioids that he got on the street.  He is not sure when he had his last dose of either prior to the seizure occurrence.  He is now staying with his mom Nicholas Harrison.  He has been feeling " on the edge".  There was treated in the drug rehab facility in [place] a few months ago.  He relapsed. His mother thinks it is a fourth occurrence of the seizures.  Per Dr Effie Shy 07/18/18: "Pt with altered mental status, likely from seizure. Mental status improved with time and treatment (including Geodon and Ativan) in ED. patient with history of substance abuse including both narcotics and benzodiazepines.  Suspect benzodiazepine withdrawal seizure.  Likely small risk of recurrent seizure at this time.  Patient to be screened by TTS, following completion of work-up in the ED. incidental hypokalemia unlikely to represent an ongoing issue, in this patient who is not vomiting, unable to take normal nutrition.  This can be followed expectantly."   There has been no  cough, chest pain, shortness of breath, abdominal pain, diarrhea, constipation, arthralgias, skin rashes.  No head injury   Observations/Objective: The patient appears to be in no acute distress, looks well.  No  tremor.  He is alert oriented and cooperative  Assessment and Plan:  See my Assessment and Plan. Follow Up Instructions:    I discussed the assessment and treatment plan with the patient. The patient was provided an opportunity to ask questions and all were answered. The patient agreed with the plan and demonstrated an understanding of the instructions.   The patient was advised to call back or seek an in-person evaluation if the symptoms worsen or if the condition fails to improve as anticipated.  I provided face-to-face time during this encounter. We were at different locations.   Sonda Primes, MD

## 2018-07-24 NOTE — Assessment & Plan Note (Signed)
We will try BuSpar again at low dose.  We may consider reducing Seroquel again.  Vraylar would be an option too

## 2018-07-24 NOTE — Assessment & Plan Note (Signed)
Relapsing, chronic  Status post a recent emergency room visit for what was thought was drug withdrawal (benzodiazepine withdrawal) seizure with postictal agitation and confusion.  The patient was working with his dad when it happened.  He was evaluated by psychiatry and discharged.  The patient thinks he was withdrawing from Xanax and opioids that he got on the streets.  He is not sure when he had his last dose of either prior to the seizure occurrence.  He is now staying with his mom [name].  He has been feeling " on the edge".  There was treated in the drug rehab facility in [place] a few months ago.  He relapsed. His mother thinks it is a fourth occurrence of the seizures.  Per Dr Effie Shy 07/18/18: "Pt with altered mental status, likely from seizure. Mental status improved with time and treatment (including Geodon and Ativan) in ED. patient with history of substance abuse including both narcotics and benzodiazepines.  Suspect benzodiazepine withdrawal seizure.  Likely small risk of recurrent seizure at this time.  Patient to be screened by TTS, following completion of work-up in the ED. incidental hypokalemia unlikely to represent an ongoing issue, in this patient who is not vomiting, unable to take normal nutrition.  This can be followed expectantly."

## 2018-07-24 NOTE — Assessment & Plan Note (Addendum)
Relapsing  4/20 Status post a recent emergency room visit for what was thought was drug withdrawal (benzodiazepine withdrawal) seizure with postictal agitation and confusion.  She has a urine drug scree screen positive was he has urine drug screen was positive for THC only  No driving for 6 months Neurology referral Clonidine as needed

## 2018-12-20 ENCOUNTER — Other Ambulatory Visit (INDEPENDENT_AMBULATORY_CARE_PROVIDER_SITE_OTHER): Payer: BC Managed Care – PPO

## 2018-12-20 ENCOUNTER — Other Ambulatory Visit: Payer: Self-pay

## 2018-12-20 ENCOUNTER — Ambulatory Visit (INDEPENDENT_AMBULATORY_CARE_PROVIDER_SITE_OTHER): Payer: BC Managed Care – PPO | Admitting: Internal Medicine

## 2018-12-20 ENCOUNTER — Encounter: Payer: Self-pay | Admitting: Internal Medicine

## 2018-12-20 VITALS — BP 136/84 | HR 79 | Temp 97.9°F | Ht 71.0 in | Wt 169.0 lb

## 2018-12-20 DIAGNOSIS — F419 Anxiety disorder, unspecified: Secondary | ICD-10-CM | POA: Diagnosis not present

## 2018-12-20 DIAGNOSIS — F332 Major depressive disorder, recurrent severe without psychotic features: Secondary | ICD-10-CM

## 2018-12-20 DIAGNOSIS — F1910 Other psychoactive substance abuse, uncomplicated: Secondary | ICD-10-CM | POA: Diagnosis not present

## 2018-12-20 DIAGNOSIS — G40909 Epilepsy, unspecified, not intractable, without status epilepticus: Secondary | ICD-10-CM

## 2018-12-20 DIAGNOSIS — R6889 Other general symptoms and signs: Secondary | ICD-10-CM

## 2018-12-20 DIAGNOSIS — Z23 Encounter for immunization: Secondary | ICD-10-CM | POA: Diagnosis not present

## 2018-12-20 LAB — CBC WITH DIFFERENTIAL/PLATELET
Basophils Absolute: 0 10*3/uL (ref 0.0–0.1)
Basophils Relative: 0.4 % (ref 0.0–3.0)
Eosinophils Absolute: 0.1 10*3/uL (ref 0.0–0.7)
Eosinophils Relative: 0.9 % (ref 0.0–5.0)
HCT: 46.3 % (ref 39.0–52.0)
Hemoglobin: 16.1 g/dL (ref 13.0–17.0)
Lymphocytes Relative: 22.1 % (ref 12.0–46.0)
Lymphs Abs: 2.1 10*3/uL (ref 0.7–4.0)
MCHC: 34.7 g/dL (ref 30.0–36.0)
MCV: 85.4 fl (ref 78.0–100.0)
Monocytes Absolute: 0.7 10*3/uL (ref 0.1–1.0)
Monocytes Relative: 7.7 % (ref 3.0–12.0)
Neutro Abs: 6.4 10*3/uL (ref 1.4–7.7)
Neutrophils Relative %: 68.9 % (ref 43.0–77.0)
Platelets: 327 10*3/uL (ref 150.0–400.0)
RBC: 5.42 Mil/uL (ref 4.22–5.81)
RDW: 13.4 % (ref 11.5–15.5)
WBC: 9.3 10*3/uL (ref 4.0–10.5)

## 2018-12-20 LAB — HEPATIC FUNCTION PANEL
ALT: 10 U/L (ref 0–53)
AST: 10 U/L (ref 0–37)
Albumin: 5.6 g/dL — ABNORMAL HIGH (ref 3.5–5.2)
Alkaline Phosphatase: 56 U/L (ref 39–117)
Bilirubin, Direct: 0.1 mg/dL (ref 0.0–0.3)
Total Bilirubin: 1.2 mg/dL (ref 0.2–1.2)
Total Protein: 8.6 g/dL — ABNORMAL HIGH (ref 6.0–8.3)

## 2018-12-20 LAB — URINALYSIS
Hgb urine dipstick: NEGATIVE
Ketones, ur: NEGATIVE
Leukocytes,Ua: NEGATIVE
Nitrite: NEGATIVE
Specific Gravity, Urine: >=1.03 — AB (ref 1.000–1.030)
Urine Glucose: NEGATIVE
Urobilinogen, UA: 0.2 (ref 0.0–1.0)
pH: 6 (ref 5.0–8.0)

## 2018-12-20 LAB — BASIC METABOLIC PANEL
BUN: 20 mg/dL (ref 6–23)
CO2: 26 mEq/L (ref 19–32)
Calcium: 10.5 mg/dL (ref 8.4–10.5)
Chloride: 98 mEq/L (ref 96–112)
Creatinine, Ser: 0.95 mg/dL (ref 0.40–1.50)
GFR: 95.6 mL/min (ref >60.00)
Glucose, Bld: 118 mg/dL — ABNORMAL HIGH (ref 70–99)
Potassium: 3.4 mEq/L — ABNORMAL LOW (ref 3.5–5.1)
Sodium: 138 mEq/L (ref 135–145)

## 2018-12-20 MED ORDER — CLONIDINE HCL 0.1 MG PO TABS: 0.1000 mg | ORAL_TABLET | Freq: Three times a day (TID) | ORAL | 1 refills | Status: DC | PRN

## 2018-12-20 MED ORDER — BUSPIRONE HCL 10 MG PO TABS: 10.0000 mg | ORAL_TABLET | Freq: Two times a day (BID) | ORAL | 3 refills | Status: DC

## 2018-12-20 MED ORDER — B COMPLEX PO TABS: 1.0000 | ORAL_TABLET | Freq: Every day | ORAL | 3 refills | Status: DC

## 2018-12-20 NOTE — Patient Instructions (Signed)
Go to ER if worse No driving 

## 2018-12-20 NOTE — Progress Notes (Signed)
Subjective:  Patient ID: Nicholas Harrison, male    DOB: 1990/06/16  Age: 28 y.o. MRN: [id number]  CC: No chief complaint on file.   HPI [name] presents for probable drug withdrawal.  He is here with his mom and dad.  According to [facility], he used opioids and tablet forms (Percocet and Norco) 6-8 a day and several benzodiazepine tablets a day (blue Xanax) he would snort them.  She smokes pot.  He is vague about the amount of pop he is smoking.  He denies using cocaine, IV drugs.  He drinks very little.  He had several seizure episodes in the past that were used either by drug use or withdrawal.  He failed several inpatient and outpatient treatment programs, seeing specialist/counselors.  He quit going to Narcotics Anonymous long time ago.  He has not used methadone clinic as I understand. The family is very supportive He states he used drugs last for 5 days prior to this visit.  He is complaining of sweats, shaking and restlessness.  She is refusing to go to emergency room. This is a complex case  Outpatient Medications Prior to Visit  Medication Sig Dispense Refill  . busPIRone (BUSPAR) 10 MG tablet Take 1 tablet (10 mg total) by mouth 2 (two) times daily. Start with 5 mg bid x 1 week, then 10 mg bid 60 tablet 3  . Cholecalciferol (VITAMIN D3) 50 MCG (2000 UT) capsule Take 1 capsule (2,000 Units total) by mouth daily. 100 capsule 3  . cloNIDine (CATAPRES) 0.1 MG tablet Take 1 tablet (0.1 mg total) by mouth 3 (three) times daily as needed (withdrawal symptoms). 60 tablet 0   No facility-administered medications prior to visit.     ROS: Review of Systems  Constitutional: Positive for diaphoresis. Negative for appetite change, fatigue and unexpected weight change.  HENT: Negative for congestion, nosebleeds, sneezing, sore throat and trouble swallowing.   Eyes: Negative for itching and visual disturbance.  Respiratory: Negative for cough.   Cardiovascular: Negative for chest pain,  palpitations and leg swelling.  Gastrointestinal: Negative for abdominal distention, blood in stool, diarrhea and nausea.  Genitourinary: Negative for frequency and hematuria.  Musculoskeletal: Negative for back pain, gait problem, joint swelling and neck pain.  Skin: Negative for rash.  Neurological: Positive for tremors. Negative for dizziness, speech difficulty and weakness.  Hematological: Does not bruise/bleed easily.  Psychiatric/Behavioral: Positive for behavioral problems, confusion and sleep disturbance. Negative for agitation, decreased concentration, dysphoric mood, hallucinations and suicidal ideas. The patient is nervous/anxious. The patient is not hyperactive.   he is not suicidal or homicidal  Objective:  There were no vitals taken for this visit.  BP Readings from Last 3 Encounters:  07/19/18 134/83  02/16/18 (!) 133/99  11/07/17 (!) 143/87    Wt Readings from Last 3 Encounters:  07/18/18 180 lb (81.6 kg)  11/07/17 160 lb (72.6 kg)  08/02/17 150 lb (68 kg)    Physical Exam Constitutional:      General: He is not in acute distress.    Appearance: He is well-developed.     Comments: NAD  Eyes:     Conjunctiva/sclera: Conjunctivae normal.     Pupils: Pupils are equal, round, and reactive to light.  Neck:     Musculoskeletal: Normal range of motion.     Thyroid: No thyromegaly.     Vascular: No JVD.  Cardiovascular:     Rate and Rhythm: Normal rate and regular rhythm.     Heart  sounds: Normal heart sounds. No murmur. No friction rub. No gallop.   Pulmonary:     Effort: Pulmonary effort is normal. No respiratory distress.     Breath sounds: Normal breath sounds. No wheezing or rales.  Chest:     Chest wall: No tenderness.  Abdominal:     General: Bowel sounds are normal. There is no distension.     Palpations: Abdomen is soft. There is no mass.     Tenderness: There is no abdominal tenderness. There is no guarding or rebound.  Musculoskeletal: Normal range  of motion.        General: No tenderness.  Lymphadenopathy:     Cervical: No cervical adenopathy.  Skin:    General: Skin is warm and dry.     Findings: No rash.  Neurological:     Mental Status: He is alert and oriented to person, place, and time.     Cranial Nerves: No cranial nerve deficit.     Motor: No abnormal muscle tone.     Coordination: Coordination normal.     Gait: Gait normal.     Deep Tendon Reflexes: Reflexes are normal and symmetric.  Psychiatric:        Behavior: Behavior normal.        Thought Content: Thought content normal.        Judgment: Judgment normal.   She is in no acute distress.  There is no obvious sweating or tachycardia.  She is dressed appropriately, clean.  Teeth with braces.  There is a very mild tremor in fingers.  He is alert and cooperative, pleasant.  Pupils are reactive to light   Lab Results  Component Value Date   WBC 6.0 07/18/2018   HGB 15.6 07/18/2018   HCT 44.7 07/18/2018   PLT 247 07/18/2018   GLUCOSE 200 (H) 07/18/2018   CHOL 152 11/09/2017   TRIG 79 11/09/2017   HDL 36 (L) 11/09/2017   LDLDIRECT 87.1 08/30/2011   LDLCALC 100 (H) 11/09/2017   ALT 18 07/18/2018   AST 23 07/18/2018   NA 138 07/18/2018   K 2.5 (LL) 07/18/2018   CL 104 07/18/2018   CREATININE 1.04 07/18/2018   BUN 13 07/18/2018   CO2 17 (L) 07/18/2018   TSH 4.489 11/09/2017   HGBA1C 4.7 (L) 11/09/2017    Ct Head Wo Contrast  Result Date: 07/18/2018 CLINICAL DATA:  28 year old male with seizure EXAM: CT HEAD WITHOUT CONTRAST CT CERVICAL SPINE WITHOUT CONTRAST TECHNIQUE: Multidetector CT imaging of the head and cervical spine was performed following the standard protocol without intravenous contrast. Multiplanar CT image reconstructions of the cervical spine were also generated. COMPARISON:  11/07/2017 FINDINGS: CT HEAD FINDINGS Brain: No acute intracranial hemorrhage. No midline shift or mass effect. Gray-white differentiation maintained. Unremarkable  appearance of the ventricular system. Vascular: Unremarkable. Skull: No acute fracture.  No aggressive bone lesion identified. Sinuses/Orbits: Unremarkable appearance of the orbits. Mastoid air cells clear. No middle ear effusion. No significant sinus disease. Other: None CT CERVICAL SPINE FINDINGS Alignment: Craniocervical junction aligned. Anatomic alignment of the cervical elements. No subluxation. Skull base and vertebrae: No acute fracture at the skullbase. Vertebral body heights relatively maintained. No acute fracture identified. Soft tissues and spinal canal: Unremarkable cervical soft tissues. Lymph nodes are present, though not enlarged. Disc levels: Unremarkable appearance of disc space, which are maintained. Upper chest: Unremarkable appearance of the lung apices. Other: No bony canal narrowing. IMPRESSION: Head CT: No acute intracranial abnormality. Cervical CT: No acute fracture  or malalignment of the cervical spine. Electronically Signed   By: Gilmer Mor D.O.   On: 07/18/2018 19:53   Ct Cervical Spine Wo Contrast  Result Date: 07/18/2018 CLINICAL DATA:  28 year old male with seizure EXAM: CT HEAD WITHOUT CONTRAST CT CERVICAL SPINE WITHOUT CONTRAST TECHNIQUE: Multidetector CT imaging of the head and cervical spine was performed following the standard protocol without intravenous contrast. Multiplanar CT image reconstructions of the cervical spine were also generated. COMPARISON:  11/07/2017 FINDINGS: CT HEAD FINDINGS Brain: No acute intracranial hemorrhage. No midline shift or mass effect. Gray-white differentiation maintained. Unremarkable appearance of the ventricular system. Vascular: Unremarkable. Skull: No acute fracture.  No aggressive bone lesion identified. Sinuses/Orbits: Unremarkable appearance of the orbits. Mastoid air cells clear. No middle ear effusion. No significant sinus disease. Other: None CT CERVICAL SPINE FINDINGS Alignment: Craniocervical junction aligned. Anatomic  alignment of the cervical elements. No subluxation. Skull base and vertebrae: No acute fracture at the skullbase. Vertebral body heights relatively maintained. No acute fracture identified. Soft tissues and spinal canal: Unremarkable cervical soft tissues. Lymph nodes are present, though not enlarged. Disc levels: Unremarkable appearance of disc space, which are maintained. Upper chest: Unremarkable appearance of the lung apices. Other: No bony canal narrowing. IMPRESSION: Head CT: No acute intracranial abnormality. Cervical CT: No acute fracture or malalignment of the cervical spine. Electronically Signed   By: Gilmer Mor D.O.   On: 07/18/2018 19:53    Assessment & Plan:   There are no diagnoses linked to this encounter.   No orders of the defined types were placed in this encounter.    Follow-up: No follow-ups on file.  Sonda Primes, MD

## 2018-12-20 NOTE — Assessment & Plan Note (Addendum)
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  Refractory to treatments. According to [facility], he used opioids and tablet forms (Percocet and Norco) 6-8 a day and several benzodiazepine tablets a day (blue Xanax) he would snort them.  She smokes pot.  He is vague about the amount of pop he is smoking.  He denies using cocaine, IV drugs.  He drinks very little.  He had several seizure episodes in the past that were used either by drug use or withdrawal.  He failed several inpatient and outpatient treatment programs, seeing specialist/counselors.  He quit going to Narcotics Anonymous long time ago.  He has not used methadone clinic as I understand. The family is very supportive Clonidine prescribed.  Go to ER if worse.  We discussed potential risks and complications of his uncontrolled drug use, including but not limited to death, brain damage, legal consequences etc. I suggested that he resumes his mental health visits and narcotic Anonymous

## 2018-12-21 LAB — PMP SCREEN PROFILE (10S), URINE
Amphetamine Scrn, Ur: NEGATIVE ng/mL
BARBITURATE SCREEN URINE: NEGATIVE ng/mL
BENZODIAZEPINE SCREEN, URINE: NEGATIVE ng/mL
CANNABINOIDS UR QL SCN: POSITIVE ng/mL — AB
Cocaine (Metab) Scrn, Ur: NEGATIVE ng/mL
Creatinine(Crt), U: 333.5 mg/dL — ABNORMAL HIGH (ref 20.0–300.0)
Methadone Screen, Urine: NEGATIVE ng/mL
OXYCODONE+OXYMORPHONE UR QL SCN: POSITIVE ng/mL — AB
Opiate Scrn, Ur: POSITIVE ng/mL — AB
Ph of Urine: 6.1 (ref 4.5–8.9)
Phencyclidine Qn, Ur: NEGATIVE ng/mL
Propoxyphene Scrn, Ur: NEGATIVE ng/mL

## 2018-12-21 LAB — VITAMIN D 25 HYDROXY (VIT D DEFICIENCY, FRACTURES): VITD: 35.71 ng/mL (ref 30.00–100.00)

## 2018-12-21 LAB — VITAMIN B12: Vitamin B-12: 212 pg/mL (ref 211–911)

## 2018-12-21 LAB — TSH: TSH: 5.51 u[IU]/mL — ABNORMAL HIGH (ref 0.35–4.50)

## 2018-12-22 ENCOUNTER — Encounter: Payer: Self-pay | Admitting: Internal Medicine

## 2018-12-22 NOTE — Assessment & Plan Note (Signed)
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  Refractory to treatments. According to [facility], he used opioids and tablet forms (Percocet and Norco) 6-8 a day and several benzodiazepine tablets a day (blue Xanax) he would snort them.  She smokes pot.  He is vague about the amount of pop he is smoking.  He denies using cocaine, IV drugs.  He drinks very little.  He had several seizure episodes in the past that were used either by drug use or withdrawal.  He failed several inpatient and outpatient treatment programs, seeing specialist/counselors.  He quit going to Narcotics Anonymous long time ago.  He has not used methadone clinic as I understand. The family is very supportive Clonidine prescribed.  Go to ER if worse.  We discussed potential risks and complications of his uncontrolled drug use, including but not limited to death, brain damage, legal consequences etc. We can continue with BuSpar

## 2018-12-22 NOTE — Assessment & Plan Note (Signed)
We can continue with BuSpar

## 2018-12-27 ENCOUNTER — Ambulatory Visit (INDEPENDENT_AMBULATORY_CARE_PROVIDER_SITE_OTHER): Payer: BC Managed Care – PPO | Admitting: Internal Medicine

## 2018-12-27 ENCOUNTER — Encounter: Payer: Self-pay | Admitting: Internal Medicine

## 2018-12-27 ENCOUNTER — Other Ambulatory Visit: Payer: Self-pay

## 2018-12-27 ENCOUNTER — Encounter (HOSPITAL_COMMUNITY): Payer: Self-pay | Admitting: Emergency Medicine

## 2018-12-27 ENCOUNTER — Emergency Department (HOSPITAL_COMMUNITY)
Admission: EM | Admit: 2018-12-27 | Discharge: 2018-12-28 | Disposition: A | Discharge status: Other Acute Inpatient Hospital | Payer: BC Managed Care – PPO | Source: Home / Self Care | Attending: Emergency Medicine | Admitting: Emergency Medicine

## 2018-12-27 VITALS — BP 134/82 | HR 61 | Temp 98.2°F | Ht 71.0 in | Wt 167.0 lb

## 2018-12-27 DIAGNOSIS — F1910 Other psychoactive substance abuse, uncomplicated: Secondary | ICD-10-CM | POA: Diagnosis not present

## 2018-12-27 DIAGNOSIS — R6889 Other general symptoms and signs: Secondary | ICD-10-CM | POA: Diagnosis not present

## 2018-12-27 DIAGNOSIS — F19259 Other psychoactive substance dependence with psychoactive substance-induced psychotic disorder, unspecified: Secondary | ICD-10-CM | POA: Diagnosis not present

## 2018-12-27 DIAGNOSIS — R410 Disorientation, unspecified: Secondary | ICD-10-CM

## 2018-12-27 DIAGNOSIS — F22 Delusional disorders: Secondary | ICD-10-CM

## 2018-12-27 DIAGNOSIS — F411 Generalized anxiety disorder: Secondary | ICD-10-CM

## 2018-12-27 DIAGNOSIS — F419 Anxiety disorder, unspecified: Secondary | ICD-10-CM | POA: Diagnosis not present

## 2018-12-27 DIAGNOSIS — F332 Major depressive disorder, recurrent severe without psychotic features: Secondary | ICD-10-CM | POA: Insufficient documentation

## 2018-12-27 DIAGNOSIS — F11259 Opioid dependence with opioid-induced psychotic disorder, unspecified: Secondary | ICD-10-CM | POA: Diagnosis not present

## 2018-12-27 DIAGNOSIS — Z79899 Other long term (current) drug therapy: Secondary | ICD-10-CM | POA: Insufficient documentation

## 2018-12-27 DIAGNOSIS — R45851 Suicidal ideations: Secondary | ICD-10-CM

## 2018-12-27 DIAGNOSIS — Z20828 Contact with and (suspected) exposure to other viral communicable diseases: Secondary | ICD-10-CM | POA: Insufficient documentation

## 2018-12-27 DIAGNOSIS — Z87891 Personal history of nicotine dependence: Secondary | ICD-10-CM | POA: Insufficient documentation

## 2018-12-27 DIAGNOSIS — Z03818 Encounter for observation for suspected exposure to other biological agents ruled out: Secondary | ICD-10-CM | POA: Diagnosis not present

## 2018-12-27 LAB — CBC WITH DIFFERENTIAL/PLATELET
Abs Immature Granulocytes: 0.03 10*3/uL (ref 0.00–0.07)
Basophils Absolute: 0.1 10*3/uL (ref 0.0–0.1)
Basophils Relative: 0 %
Eosinophils Absolute: 0.1 10*3/uL (ref 0.0–0.5)
Eosinophils Relative: 1 %
HCT: 46.5 % (ref 39.0–52.0)
Hemoglobin: 16 g/dL (ref 13.0–17.0)
Immature Granulocytes: 0 %
Lymphocytes Relative: 18 %
Lymphs Abs: 2.3 10*3/uL (ref 0.7–4.0)
MCH: 28.9 pg (ref 26.0–34.0)
MCHC: 34.4 g/dL (ref 30.0–36.0)
MCV: 84.1 fL (ref 80.0–100.0)
Monocytes Absolute: 0.9 10*3/uL (ref 0.1–1.0)
Monocytes Relative: 7 %
Neutro Abs: 9.1 10*3/uL — ABNORMAL HIGH (ref 1.7–7.7)
Neutrophils Relative %: 74 %
Platelets: 391 10*3/uL (ref 150–400)
RBC: 5.53 MIL/uL (ref 4.22–5.81)
RDW: 12.7 % (ref 11.5–15.5)
WBC: 12.6 10*3/uL — ABNORMAL HIGH (ref 4.0–10.5)
nRBC: 0 % (ref 0.0–0.2)

## 2018-12-27 LAB — BASIC METABOLIC PANEL
Anion gap: 9 (ref 5–15)
BUN: 11 mg/dL (ref 6–20)
CO2: 23 mmol/L (ref 22–32)
Calcium: 9.7 mg/dL (ref 8.9–10.3)
Chloride: 107 mmol/L (ref 98–111)
Creatinine, Ser: 0.67 mg/dL (ref 0.61–1.24)
GFR calc Af Amer: >60 mL/min (ref >60)
GFR calc non Af Amer: >60 mL/min (ref >60)
Glucose, Bld: 107 mg/dL — ABNORMAL HIGH (ref 70–99)
Potassium: 3.6 mmol/L (ref 3.5–5.1)
Sodium: 139 mmol/L (ref 135–145)

## 2018-12-27 LAB — RAPID URINE DRUG SCREEN, HOSP PERFORMED
Amphetamines: NOT DETECTED
Barbiturates: NOT DETECTED
Benzodiazepines: NOT DETECTED
Cocaine: NOT DETECTED
Opiates: NOT DETECTED
Tetrahydrocannabinol: POSITIVE — AB

## 2018-12-27 LAB — ETHANOL: Alcohol, Ethyl (B): <10 mg/dL (ref <10)

## 2018-12-27 MED ORDER — ZIPRASIDONE MESYLATE 20 MG IM SOLR
20.0000 mg | INTRAMUSCULAR | Status: DC | PRN
  Administered 2018-12-27: 20 mg via INTRAMUSCULAR
  Filled 2018-12-27: qty 20

## 2018-12-27 MED ORDER — STERILE WATER FOR INJECTION IJ SOLN
INTRAMUSCULAR | Status: AC
  Filled 2018-12-27: qty 10

## 2018-12-27 MED ORDER — LORAZEPAM 2 MG/ML IJ SOLN
2.0000 mg | INTRAMUSCULAR | Status: DC | PRN
  Administered 2018-12-27: 2 mg via INTRAMUSCULAR
  Filled 2018-12-27: qty 1

## 2018-12-27 MED ORDER — CARIPRAZINE HCL 1.5 MG PO CAPS: 1.5000 mg | ORAL_CAPSULE | Freq: Every day | ORAL | 3 refills | Status: DC

## 2018-12-27 NOTE — ED Notes (Signed)
Patient has been served IVC paper work. Paper work faxed to [facility].

## 2018-12-27 NOTE — ED Provider Notes (Signed)
[facility] EMERGENCY DEPARTMENT Provider Note   CSN: [id number] Arrival date & time: 12/27/18  1910     History   Chief Complaint Chief Complaint  Patient presents with  . Altered Mental Status    HPI Nicholas Harrison is a 28 y.o. male.     HPI  Patient presents with his mother and father, who are not living together.  He presents because his behavior is different from earlier today, after he took a first dose of regular to treat possible confusion and agitation, following cessation of use of benzodiazepines and opiates, which are illicit drugs which he uses.  He saw his doctor last week and was placed on Klonopin, took 2 doses, and developed "hallucinations."  Today the patient states he was at home when he started thinking about his prior use of opiates and benzodiazepines and that made him trouble.  Family members with him state that he told them he knew something about the COVID pandemic and that his parents were behind it.  He feels like they are withholding secrets from him.  He also feels like there is something on his phone that he needs to know about related to COVID but he cannot find it.  He currently lives with his father.  While in the emergency department tonight he told his parents that he was thinking about shooting himself with a gun.  His father states that he is taking the patient's guns away.  Patient states he has not resumed use of illegal drugs since he stopped last week.  He plans on seeing a therapist tomorrow.  He does not receive ongoing psychiatric care at this time.  He is currently prescribed BuSpar which he states he is taking, and the Vraylar was prescribed today.  He is not currently using clonidine.  There have been no recent illnesses.  There are no other known modifying factors.  Past Medical History:  Diagnosis Date  . Anxiety   . Depression   . Drug-induced seizure (HCC)   . Seizures [facility])     Patient Active Problem List   Diagnosis Date Noted   . Delirium 12/27/2018  . Seizure disorder (HCC) 07/24/2018  . 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 Polysubstance abuse (HCC) 10/19/2016  . Benzodiazepine dependence, episodic (HCC) 10/14/2016  . Moderate tetrahydrocannabinol (THC) dependence (HCC) 05/31/2016  . Opioid use disorder, severe, dependence (HCC) 10/29/2014  . ADD (attention deficit disorder) 02/15/2012  . Well adult exam 09/02/2011  . Rash 09/02/2011  . Chronic anxiety 06/05/2011    Past Surgical History:  Procedure Laterality Date  . HAND SURGERY          Home Medications    Prior to Admission medications   Medication Sig Start Date End Date Taking? Authorizing Provider  b complex vitamins tablet Take 1 tablet by mouth daily. 12/20/18  Yes Plotnikov, Georgina Quint, MD  busPIRone (BUSPAR) 10 MG tablet Take 1 tablet (10 mg total) by mouth 2 (two) times daily. Start with 5 mg bid x 1 week, then 10 mg bid Patient taking differently: Take 5-10 mg by mouth See admin instructions. Start with 5 mg twice daily x 1 week starting on 12/27/2018 then 10 mg twice daily 12/20/18  Yes Plotnikov, Georgina Quint, MD  cariprazine (VRAYLAR) capsule Take 1 capsule (1.5 mg total) by mouth daily. 12/27/18  Yes Plotnikov, Georgina Quint, MD  Cholecalciferol (VITAMIN D3)  50 MCG (2000 UT) capsule Take 1 capsule (2,000 Units total) by mouth daily. 07/24/18  Yes Plotnikov, Georgina Quint, MD  cloNIDine (CATAPRES) 0.1 MG tablet Take 1 tablet (0.1 mg total) by mouth 3 (three) times daily as needed (withdrawal symptoms). 12/20/18  Yes Plotnikov, Georgina Quint, MD  Potassium 99 MG TABS Take 1 tablet by mouth daily.   Yes [provider]    Family History Family History  Problem Relation Age of Onset  . Depression Mother        anxiety  . Diabetes Maternal Grandfather     Social History Social History   Tobacco Use  . Smoking status:  Current Some Day Smoker  . Smokeless tobacco: Former Neurosurgeon    Types: Chew  . Tobacco comment: only experimented  Substance Use Topics  . Alcohol use: Yes    Comment: occasionally   . Drug use: Yes    Types: Benzodiazepines, Oxycodone    Comment: pt reports he takes Oxycodone and Xanax, last used 2 weeks ago as of 07/18/18     Allergies   Buspar [buspirone] and Seroquel [quetiapine fumarate]   Review of Systems Review of Systems  All other systems reviewed and are negative.    Physical Exam Updated Vital Signs BP (!) 160/99 (BP Location: Right Arm)   Pulse (!) 112   Temp 98.8 F (37.1 C) (Oral)   Resp 18   Ht 5\' 10"  (1.778 m)   Wt 74.8 kg   SpO2 100%   BMI 23.68 kg/m   Physical Exam Vitals signs and nursing note reviewed.  Constitutional:      Appearance: He is well-developed.  HENT:     Head: Normocephalic and atraumatic.     Right Ear: External ear normal.     Left Ear: External ear normal.  Eyes:     Conjunctiva/sclera: Conjunctivae normal.     Pupils: Pupils are equal, round, and reactive to light.  Neck:     Musculoskeletal: Normal range of motion and neck supple.     Trachea: Phonation normal.  Cardiovascular:     Rate and Rhythm: Normal rate.  Pulmonary:     Effort: Pulmonary effort is normal.  Abdominal:     Palpations: Abdomen is soft.  Musculoskeletal: Normal range of motion.        General: No swelling.  Skin:    General: Skin is warm and dry.  Neurological:     Mental Status: He is alert and oriented to person, place, and time.     Cranial Nerves: No cranial nerve deficit.     Sensory: No sensory deficit.     Motor: No abnormal muscle tone.     Coordination: Coordination normal.  Psychiatric:        Attention and Perception: He is inattentive.        Mood and Affect: Mood is anxious. Affect is labile and inappropriate.        Speech: He is communicative. Speech is rapid and pressured and tangential.        Behavior: Behavior is agitated  and aggressive.        Thought Content: Thought content is paranoid and delusional. Thought content includes suicidal ideation. Thought content includes suicidal plan. Thought content does not include homicidal plan.        Cognition and Memory: Cognition is impaired.        Judgment: Judgment is impulsive and inappropriate.      ED Treatments / Results  Labs (all  labs ordered are listed, but only abnormal results are displayed) Labs Reviewed  RAPID URINE DRUG SCREEN, HOSP PERFORMED - Abnormal; Notable for the following components:      Result Value   Tetrahydrocannabinol POSITIVE (*)    All other components within normal limits  BASIC METABOLIC PANEL - Abnormal; Notable for the following components:   Glucose, Bld 107 (*)    All other components within normal limits  CBC WITH DIFFERENTIAL/PLATELET - Abnormal; Notable for the following components:   WBC 12.6 (*)    Neutro Abs 9.1 (*)    All other components within normal limits  ETHANOL    EKG None  Radiology No results found.  Procedures .Critical Care Performed by: Mancel BaleWentz, Netanya Yazdani, MD Authorized by: Mancel BaleWentz, Johnattan Strassman, MD   Critical care provider statement:    Critical care time (minutes):  35   Critical care start time:  12/27/2018 9:55 PM   Critical care end time:  12/27/2018 11:59 PM   Critical care time was exclusive of:  Separately billable procedures and treating other patients   Critical care was time spent personally by me on the following activities:  Blood draw for specimens, development of treatment plan with patient or surrogate, discussions with consultants, evaluation of patient's response to treatment, examination of patient, obtaining history from patient or surrogate, ordering and performing treatments and interventions, ordering and review of laboratory studies, pulse oximetry, re-evaluation of patient's condition, review of old charts and ordering and review of radiographic studies   (including critical care  time)  Medications Ordered in ED Medications  ziprasidone (GEODON) injection 20 mg (20 mg Intramuscular Given 12/27/18 2200)  LORazepam (ATIVAN) injection 2 mg (2 mg Intramuscular Given 12/27/18 2159)  sterile water (preservative free) injection (has no administration in time range)     Initial Impression / Assessment and Plan / ED Course  I have reviewed the triage vital signs and the nursing notes.  Pertinent labs & imaging results that were available during my care of the patient were reviewed by me and considered in my medical decision making (see chart for details).  Clinical Course as of Dec 26 2345  Thu Dec 27, 2018  2346 Normal  Ethanol [EW]  2346 Normal except glucose slightly high  Basic metabolic panel(!) [EW]  2346 Normal except white count high, neutrophils high  CBC with Differential(!) [EW]  2346 Normal except presence of THC  Rapid urine drug screen (hospital performed)(!) [EW]  2346 Abnormal, high at 7:16 PM  BP(!): 160/99 [EW]    Clinical Course User Index [EW] Mancel BaleWentz, Nannette Zill, MD        Patient Vitals for the past 24 hrs:  BP Temp Temp src Pulse Resp SpO2 Height Weight  12/27/18 1916 (!) 160/99 98.8 F (37.1 C) Oral (!) 112 18 100 % 5\' 10"  (1.778 m) 74.8 kg    11:47 PM Reevaluation with update and discussion. After initial assessment and treatment, an updated evaluation reveals resting comfortably after sedation medications. Mancel BaleElliott Antuan Limes   Medical Decision Making: Acute psychosis with suicidal ideation, in the setting of substance abuse, possibly complicated by withdrawal. Patient with a longstanding history of substance abuse and unusual behavior.  IVC commitment required to protect the patient.  CRITICAL CARE-yes Performed by: Mancel BaleElliott Lismary Kiehn   Nursing Notes Reviewed/ Care Coordinated Applicable Imaging Reviewed Interpretation of Laboratory Data incorporated into ED treatment   Plan-disposition per TTS in conjunction with oncoming provider team   Final Clinical Impressions(s) / ED Diagnoses   Final diagnoses:  None    ED Discharge Orders    None       Daleen Bo, MD 12/27/18 2359

## 2018-12-27 NOTE — Assessment & Plan Note (Signed)
Psychiatry ref Dr Reddy Started Vraylar 

## 2018-12-27 NOTE — ED Triage Notes (Signed)
Pt has been coming off opioids and thc. Pt's mom states they started him on meds today today to help with confusion but has gotten worse. Mom states he can be combative.

## 2018-12-27 NOTE — Assessment & Plan Note (Addendum)
Start Vraylar low dose Psychiatry ref Dr Reece Levy Go to ER if worse.  Alternatively they can go to see intake person at [facility]

## 2018-12-27 NOTE — ED Notes (Signed)
Pt denies SI or HI. Pt admits to using pain medication 2 weeks ago and marijuana use about 2 weeks ago.  Pt denies ETOH use. Pt denies hallucinations.  + tremors, +agitation.  Denies nausea and emesis. Pt admits to having a good appetite.

## 2018-12-27 NOTE — Progress Notes (Addendum)
Subjective:  Patient ID: Nicholas Harrison, male    DOB: 16-Jul-1990  Age: 28 y.o. MRN: [id number]  CC: No chief complaint on file.   HPI Nicholas BlossomDavid B Zimmerle presents for talking constantly to imaginary people, not being able to sleep, c/o anxiety.  He started to talk to imaginary people after he took a couple doses of clonidine.  He took clonidine before without any problems.  He does not have any recollection of these conversations.  There is nothing violent or sexual according to his parents.  He has been talking about his job, [employer] etc.  He is staying with his mother under almost constant supervision.  There is been no seizure, tremor, blackouts.  Mal AmabileBrock used to take Seroquel in the past, however, he had side effects in the form of somnolence and unpleasant dreams.  Outpatient Medications Prior to Visit  Medication Sig Dispense Refill  . b complex vitamins tablet Take 1 tablet by mouth daily. 100 tablet 3  . busPIRone (BUSPAR) 10 MG tablet Take 1 tablet (10 mg total) by mouth 2 (two) times daily. Start with 5 mg bid x 1 week, then 10 mg bid 60 tablet 3  . cloNIDine (CATAPRES) 0.1 MG tablet Take 1 tablet (0.1 mg total) by mouth 3 (three) times daily as needed (withdrawal symptoms). 60 tablet 1  . Cholecalciferol (VITAMIN D3) 50 MCG (2000 UT) capsule Take 1 capsule (2,000 Units total) by mouth daily. (Patient not taking: Reported on 12/20/2018) 100 capsule 3   No facility-administered medications prior to visit.     ROS: Review of Systems  Constitutional: Negative for appetite change, fatigue and unexpected weight change.  HENT: Negative for congestion, nosebleeds, sneezing, sore throat and trouble swallowing.   Eyes: Negative for itching and visual disturbance.  Respiratory: Negative for cough.   Cardiovascular: Negative for chest pain, palpitations and leg swelling.  Gastrointestinal: Negative for abdominal distention, blood in stool, diarrhea and nausea.  Genitourinary: Negative for  frequency and hematuria.  Musculoskeletal: Negative for back pain, gait problem, joint swelling and neck pain.  Skin: Negative for rash.  Neurological: Negative for dizziness, tremors, speech difficulty and weakness.  Psychiatric/Behavioral: Positive for behavioral problems, confusion, decreased concentration, dysphoric mood and sleep disturbance. Negative for agitation and suicidal ideas. The patient is nervous/anxious.   He is not homicidal  Objective:  BP 134/82 (BP Location: Right Arm, Patient Position: Sitting, Cuff Size: Normal)   Pulse 61   Temp 98.2 F (36.8 C) (Oral)   Ht 5\' 11"  (1.803 m)   Wt 167 lb (75.8 kg)   SpO2 98%   BMI 23.29 kg/m   BP Readings from Last 3 Encounters:  12/27/18 134/82  12/20/18 136/84  07/19/18 134/83    Wt Readings from Last 3 Encounters:  12/27/18 167 lb (75.8 kg)  12/20/18 169 lb (76.7 kg)  07/18/18 180 lb (81.6 kg)    Physical Exam Constitutional:      General: He is not in acute distress.    Appearance: He is well-developed.     Comments: NAD  Eyes:     Conjunctiva/sclera: Conjunctivae normal.     Pupils: Pupils are equal, round, and reactive to light.  Neck:     Musculoskeletal: Normal range of motion.     Thyroid: No thyromegaly.     Vascular: No JVD.  Cardiovascular:     Rate and Rhythm: Normal rate and regular rhythm.     Heart sounds: Normal heart sounds. No murmur. No friction rub.  No gallop.   Pulmonary:     Effort: Pulmonary effort is normal. No respiratory distress.     Breath sounds: Normal breath sounds. No wheezing or rales.  Chest:     Chest wall: No tenderness.  Abdominal:     General: Bowel sounds are normal. There is no distension.     Palpations: Abdomen is soft. There is no mass.     Tenderness: There is no abdominal tenderness. There is no guarding or rebound.  Musculoskeletal: Normal range of motion.        General: No tenderness.  Lymphadenopathy:     Cervical: No cervical adenopathy.  Skin:     General: Skin is warm and dry.     Findings: No rash.  Neurological:     Mental Status: He is alert and oriented to person, place, and time.     Cranial Nerves: No cranial nerve deficit.     Motor: No abnormal muscle tone.     Coordination: Coordination normal.     Gait: Gait normal.     Deep Tendon Reflexes: Reflexes are normal and symmetric.  Psychiatric:        Behavior: Behavior normal.        Thought Content: Thought content normal.   No tremor.  Pupils reactive. No ataxia  Lab Results  Component Value Date   WBC 9.3 12/20/2018   HGB 16.1 12/20/2018   HCT 46.3 12/20/2018   PLT 327.0 12/20/2018   GLUCOSE 118 (H) 12/20/2018   CHOL 152 11/09/2017   TRIG 79 11/09/2017   HDL 36 (L) 11/09/2017   LDLDIRECT 87.1 08/30/2011   LDLCALC 100 (H) 11/09/2017   ALT 10 12/20/2018   AST 10 12/20/2018   NA 138 12/20/2018   K 3.4 (L) 12/20/2018   CL 98 12/20/2018   CREATININE 0.95 12/20/2018   BUN 20 12/20/2018   CO2 26 12/20/2018   TSH 5.51 (H) 12/20/2018   HGBA1C 4.7 (L) 11/09/2017    Ct Head Wo Contrast  Result Date: 07/18/2018 CLINICAL DATA:  28 year old male with seizure EXAM: CT HEAD WITHOUT CONTRAST CT CERVICAL SPINE WITHOUT CONTRAST TECHNIQUE: Multidetector CT imaging of the head and cervical spine was performed following the standard protocol without intravenous contrast. Multiplanar CT image reconstructions of the cervical spine were also generated. COMPARISON:  11/07/2017 FINDINGS: CT HEAD FINDINGS Brain: No acute intracranial hemorrhage. No midline shift or mass effect. Gray-white differentiation maintained. Unremarkable appearance of the ventricular system. Vascular: Unremarkable. Skull: No acute fracture.  No aggressive bone lesion identified. Sinuses/Orbits: Unremarkable appearance of the orbits. Mastoid air cells clear. No middle ear effusion. No significant sinus disease. Other: None CT CERVICAL SPINE FINDINGS Alignment: Craniocervical junction aligned. Anatomic alignment  of the cervical elements. No subluxation. Skull base and vertebrae: No acute fracture at the skullbase. Vertebral body heights relatively maintained. No acute fracture identified. Soft tissues and spinal canal: Unremarkable cervical soft tissues. Lymph nodes are present, though not enlarged. Disc levels: Unremarkable appearance of disc space, which are maintained. Upper chest: Unremarkable appearance of the lung apices. Other: No bony canal narrowing. IMPRESSION: Head CT: No acute intracranial abnormality. Cervical CT: No acute fracture or malalignment of the cervical spine. Electronically Signed   By: Corrie Mckusick D.O.   On: 07/18/2018 19:53   Ct Cervical Spine Wo Contrast  Result Date: 07/18/2018 CLINICAL DATA:  28 year old male with seizure EXAM: CT HEAD WITHOUT CONTRAST CT CERVICAL SPINE WITHOUT CONTRAST TECHNIQUE: Multidetector CT imaging of the head and cervical spine was  performed following the standard protocol without intravenous contrast. Multiplanar CT image reconstructions of the cervical spine were also generated. COMPARISON:  11/07/2017 FINDINGS: CT HEAD FINDINGS Brain: No acute intracranial hemorrhage. No midline shift or mass effect. Gray-white differentiation maintained. Unremarkable appearance of the ventricular system. Vascular: Unremarkable. Skull: No acute fracture.  No aggressive bone lesion identified. Sinuses/Orbits: Unremarkable appearance of the orbits. Mastoid air cells clear. No middle ear effusion. No significant sinus disease. Other: None CT CERVICAL SPINE FINDINGS Alignment: Craniocervical junction aligned. Anatomic alignment of the cervical elements. No subluxation. Skull base and vertebrae: No acute fracture at the skullbase. Vertebral body heights relatively maintained. No acute fracture identified. Soft tissues and spinal canal: Unremarkable cervical soft tissues. Lymph nodes are present, though not enlarged. Disc levels: Unremarkable appearance of disc space, which are  maintained. Upper chest: Unremarkable appearance of the lung apices. Other: No bony canal narrowing. IMPRESSION: Head CT: No acute intracranial abnormality. Cervical CT: No acute fracture or malalignment of the cervical spine. Electronically Signed   By: Gilmer Mor D.O.   On: 07/18/2018 19:53    Assessment & Plan:   There are no diagnoses linked to this encounter.   Meds ordered this encounter  Medications  . cariprazine (VRAYLAR) capsule    Sig: Take 1 capsule (1.5 mg total) by mouth daily.    Dispense:  30 capsule    Refill:  3     Follow-up: No follow-ups on file.  Sonda Primes, MD

## 2018-12-27 NOTE — Assessment & Plan Note (Signed)
Psychiatry ref Dr Tally Joe

## 2018-12-28 ENCOUNTER — Inpatient Hospital Stay (HOSPITAL_COMMUNITY)
Admission: AD | Admit: 2018-12-28 | Discharge: 2019-01-02 | DRG: 897 | Disposition: A | Discharge status: Home / Self Care | Payer: BC Managed Care – PPO | Source: Intra-hospital | Attending: Psychiatry | Admitting: Psychiatry

## 2018-12-28 ENCOUNTER — Encounter (HOSPITAL_COMMUNITY): Payer: Self-pay | Admitting: Emergency Medicine

## 2018-12-28 ENCOUNTER — Encounter (HOSPITAL_COMMUNITY): Payer: Self-pay

## 2018-12-28 ENCOUNTER — Telehealth: Payer: Self-pay

## 2018-12-28 DIAGNOSIS — F12159 Cannabis abuse with psychotic disorder, unspecified: Secondary | ICD-10-CM | POA: Diagnosis present

## 2018-12-28 DIAGNOSIS — F11259 Opioid dependence with opioid-induced psychotic disorder, unspecified: Principal | ICD-10-CM | POA: Diagnosis present

## 2018-12-28 DIAGNOSIS — F19959 Other psychoactive substance use, unspecified with psychoactive substance-induced psychotic disorder, unspecified: Secondary | ICD-10-CM | POA: Diagnosis not present

## 2018-12-28 DIAGNOSIS — R569 Unspecified convulsions: Secondary | ICD-10-CM | POA: Diagnosis present

## 2018-12-28 DIAGNOSIS — G4700 Insomnia, unspecified: Secondary | ICD-10-CM | POA: Diagnosis present

## 2018-12-28 DIAGNOSIS — F419 Anxiety disorder, unspecified: Secondary | ICD-10-CM | POA: Diagnosis present

## 2018-12-28 DIAGNOSIS — F329 Major depressive disorder, single episode, unspecified: Secondary | ICD-10-CM | POA: Diagnosis present

## 2018-12-28 DIAGNOSIS — E559 Vitamin D deficiency, unspecified: Secondary | ICD-10-CM | POA: Diagnosis present

## 2018-12-28 DIAGNOSIS — Z20828 Contact with and (suspected) exposure to other viral communicable diseases: Secondary | ICD-10-CM | POA: Diagnosis present

## 2018-12-28 DIAGNOSIS — Z79899 Other long term (current) drug therapy: Secondary | ICD-10-CM | POA: Diagnosis not present

## 2018-12-28 DIAGNOSIS — Z818 Family history of other mental and behavioral disorders: Secondary | ICD-10-CM

## 2018-12-28 DIAGNOSIS — F19259 Other psychoactive substance dependence with psychoactive substance-induced psychotic disorder, unspecified: Secondary | ICD-10-CM | POA: Diagnosis present

## 2018-12-28 DIAGNOSIS — F1123 Opioid dependence with withdrawal: Secondary | ICD-10-CM | POA: Diagnosis not present

## 2018-12-28 DIAGNOSIS — R45851 Suicidal ideations: Secondary | ICD-10-CM | POA: Diagnosis present

## 2018-12-28 DIAGNOSIS — Z833 Family history of diabetes mellitus: Secondary | ICD-10-CM | POA: Diagnosis not present

## 2018-12-28 DIAGNOSIS — F13159 Sedative, hypnotic or anxiolytic abuse with sedative, hypnotic or anxiolytic-induced psychotic disorder, unspecified: Secondary | ICD-10-CM | POA: Diagnosis present

## 2018-12-28 LAB — SARS CORONAVIRUS 2 BY RT PCR (HOSPITAL ORDER, PERFORMED IN ~~LOC~~ HOSPITAL LAB): SARS Coronavirus 2: NEGATIVE

## 2018-12-28 MED ORDER — CARIPRAZINE HCL 1.5 MG PO CAPS
1.5000 mg | ORAL_CAPSULE | Freq: Every day | ORAL | Status: DC
  Filled 2018-12-28 (×2): qty 1

## 2018-12-28 MED ORDER — HYDROXYZINE HCL 25 MG PO TABS
25.0000 mg | ORAL_TABLET | Freq: Once | ORAL | Status: AC
  Administered 2018-12-28: 25 mg via ORAL
  Filled 2018-12-28: qty 1

## 2018-12-28 MED ORDER — BUSPIRONE HCL 10 MG PO TABS
10.0000 mg | ORAL_TABLET | Freq: Two times a day (BID) | ORAL | Status: DC
  Filled 2018-12-28 (×4): qty 1

## 2018-12-28 MED ORDER — HYDROXYZINE HCL 50 MG PO TABS
50.0000 mg | ORAL_TABLET | Freq: Once | ORAL | Status: AC
  Administered 2018-12-28: 50 mg via ORAL
  Filled 2018-12-28 (×2): qty 1

## 2018-12-28 MED ORDER — B COMPLEX-C PO TABS
1.0000 | ORAL_TABLET | Freq: Every day | ORAL | Status: DC
  Administered 2018-12-29 – 2019-01-02 (×5): 1 via ORAL
  Filled 2018-12-28 (×7): qty 1

## 2018-12-28 MED ORDER — CLONIDINE HCL 0.1 MG PO TABS: 0.1000 mg | ORAL_TABLET | Freq: Three times a day (TID) | ORAL | Status: DC | PRN

## 2018-12-28 NOTE — Telephone Encounter (Signed)
Key: [id number]

## 2018-12-28 NOTE — Progress Notes (Signed)
Patient ID: Nicholas Harrison, male   DOB: 05/06/1990, 28 y.o.   MRN: [id number] Patient admitted to the unit due to poly substance abuse and hallucinations.  Patient stated that he began to perseverate over the corona virus.  Patient became agitated and aggressive in the home.  Skin assessment complete patient found to be free of injury and contraband. Patient admitted to the unit without incident.

## 2018-12-28 NOTE — ED Notes (Signed)
Called [facility] for transport to [facility].   

## 2018-12-28 NOTE — ED Notes (Signed)
Have given report to [facility]

## 2018-12-28 NOTE — BH Assessment (Addendum)
Tele Assessment Note   Patient Name: Nicholas Harrison MRN: [id number] Referring Physician: Christ Kick, MD Location of Patient: [facility] Location of Provider: [place]  RAY GERVASI is an 28 y.o. male.   Nicholas Harrison is an 28 y.o. male presenting at [facility] voluntarily for hallucinations and delusional thoughts about COVID 19 as well as paranoia. Pt was accompanied by both parents but was later IVC'ed at the ED by Dr. Eulis Foster MD, per IVC paperwork, " Pt thinks that his parents are behind the [place] and withholding secrets from him. He also thinks secrets are on his phone. He threatened to shoot himself with gun." During TTS assessment  pt expressed, " I was talking out of the normal, I think I got confused. I started trying to figure things out  With COVID". Pt denied SI, HI and AVH. Pt denied any past SI and no self injurious behaviors. PT denied any past traumas or abuse. Pt reported symptoms of depression such as isolation, anxiety and feeling worthless. Pt admitted to having access to weapons, he has guns at his parents home. Pt denied any current drug use but marijuana was found via his lab results. Pt admitted to past use of benzos, opiates, and xanax. Pt recently met with provider and was prescribed clonidine.Pt also taking Buspar as recommended by his PCP. Pt received inpatient treatment September 2019 for depression and substance use. Pt reported normal sleep and appetite. Pt reported he is employed and stays with parents and has no upcoming criminal charges or court dates.  Collateral:  TTS counselor contacted pt's mother Tammy. Pt's mother expressed that her son had starting displaying unusual behavior such as laughing uncontrollably and talking about COVID 28 and accusing parents of being involved in [place].  Pts mother expressed pt having hallucinations before starting new medicine clonidine yesterday. Pt's mothert said that his delusions started yesterday  after taking clonodine. Pts mother also stated that he was getting violent and agitated in the ED and threatened to kill himself with gun.    Patient was calm and cooperative and pleasant during assessment.   Pt's eye contact was fair. Pt's mood was pleasant. Pt did not appear to be delusional.  Pt's affect was congruent with mood. Pt's thought process was relevant/coherent. Pt's judgement was partial. Pt's concentration was normal. Pt's insight was fair. Pt's impulse control was poor. Pt was oriented x3. Pt reported, if discharged from [facility] he could contract for safety.                                            Disposition: Per Mitchell Heir, NP pt is recommended for overnight observation. TTS conformed with provider at [place] pt status.     Diagnosis: MDD (major depressive disorder), recurrent severe,without psychosis   Poly substance abuse  Past Medical History:  Past Medical History:  Diagnosis Date  . Anxiety   . Depression   . Drug-induced seizure ([place])   . Seizures ([place])     Past Surgical History:  Procedure Laterality Date  . HAND SURGERY      Family History:  Family History  Problem Relation Age of Onset  . Depression Mother        anxiety  . Diabetes Maternal Grandfather     Social History:  reports that he has been smoking. He has quit using smokeless tobacco.  His smokeless tobacco use included chew. He reports current alcohol use. He reports current drug use. Drugs: Benzodiazepines and Oxycodone.  Additional Social History:  Alcohol / Drug Use Pain Medications: see MAR Prescriptions: see MAR Over the Counter: see MAR History of alcohol / drug use?: Yes Substance #1 Name of Substance 1: Marijuana Substance #2 Name of Substance 2: Opiates Substance #3 Name of Substance 3: Benzos  CIWA: CIWA-Ar BP: 139/85 Pulse Rate: 92 COWS:    Allergies:  Allergies  Allergen Reactions  . Buspar [Buspirone] Nausea Only    Dizziness (also) The patient does not  think it was problem.  He is willing to try it again  . Seroquel [Quetiapine Fumarate]     Sleepy, vivid dreams    Home Medications: (Not in a hospital admission)   OB/GYN Status:  No LMP for male patient.  General Assessment Data Location of Assessment: [facility] TTS Assessment: In system Is this a Tele or Face-to-Face Assessment?: Tele Assessment Is this an Initial Assessment or a Re-assessment for this encounter?: Initial Assessment Patient Accompanied by:: Parent Living Arrangements: Other (Comment) What gender do you identify as?: Male Marital status: Single Pregnancy Status: No Living Arrangements: Parent(Dad) Can pt return to current living arrangement?: Yes Admission Status: Voluntary     Crisis Care Plan Living Arrangements: Parent(Dad)                    ADLScreening [facility] Assessment Services) Patient's cognitive ability adequate to safely complete daily activities?: Yes Patient able to express need for assistance with ADLs?: Yes Independently performs ADLs?: Yes (appropriate for developmental age)        ADL Screening (condition at time of admission) Patient's cognitive ability adequate to safely complete daily activities?: Yes Is the patient deaf or have difficulty hearing?: No Does the patient have difficulty seeing, even when wearing glasses/contacts?: Yes Does the patient have difficulty concentrating, remembering, or making decisions?: No Patient able to express need for assistance with ADLs?: Yes Does the patient have difficulty dressing or bathing?: No Independently performs ADLs?: Yes (appropriate for developmental age) Does the patient have difficulty walking or climbing stairs?: No Weakness of Arms/Hands: None  Home Assistive Devices/Equipment Home Assistive Devices/Equipment: None          Advance Directives (For Healthcare) Does Patient Have a Medical Advance Directive?: No Would patient like information on creating a medical  advance directive?: No - Patient declined        Disposition: Per Mitchell Heir, NP pt is recommended for overnight observation. TTS conformed with provider at [place] pt status.    This service was provided via telemedicine using a 2-way, interactive audio and video technology.  Names of all persons participating in this telemedicine service and their role in this encounter. Name: Jawanza Zambito Role: Patient  Name: Antony Contras Role: TTS  Name:  Role:   Name:  Role:     Donato Heinz 12/28/2018 12:23 AM

## 2018-12-28 NOTE — Progress Notes (Addendum)
Pt accepted to [facility]  Dr Dwyane Dee is the accepting provider    Dr. Jake Samples is the attending provider.    Call report to [place] @ [place] ED notified.     Pt is voluntary and can be transported by [employer]  Pt is scheduled to arrive at [facility] at [place], [place], [place] Disposition [place] [facility]/TTS [phone]

## 2018-12-28 NOTE — Telephone Encounter (Signed)
Your request has been approved Effective from 12/28/2018 through 12/26/2021.

## 2018-12-28 NOTE — ED Notes (Signed)
Pt refused his lunch. Pt just wanted his drink.

## 2018-12-29 DIAGNOSIS — F19959 Other psychoactive substance use, unspecified with psychoactive substance-induced psychotic disorder, unspecified: Secondary | ICD-10-CM

## 2018-12-29 MED ORDER — DOXEPIN HCL 25 MG PO CAPS
25.0000 mg | ORAL_CAPSULE | Freq: Every evening | ORAL | Status: DC | PRN
  Administered 2018-12-31 – 2019-01-01 (×2): 25 mg via ORAL
  Filled 2018-12-29 (×2): qty 1

## 2018-12-29 MED ORDER — PERPHENAZINE 2 MG PO TABS
2.0000 mg | ORAL_TABLET | Freq: Every day | ORAL | Status: DC
  Administered 2018-12-29 – 2019-01-01 (×4): 2 mg via ORAL
  Filled 2018-12-29 (×6): qty 1

## 2018-12-29 MED ORDER — LORAZEPAM 1 MG PO TABS
1.0000 mg | ORAL_TABLET | Freq: Four times a day (QID) | ORAL | Status: DC | PRN
  Administered 2019-01-01: 1 mg via ORAL
  Filled 2018-12-29: qty 1

## 2018-12-29 MED ORDER — BUSPIRONE HCL 10 MG PO TABS
10.0000 mg | ORAL_TABLET | Freq: Three times a day (TID) | ORAL | Status: DC
  Administered 2018-12-29 – 2019-01-02 (×12): 10 mg via ORAL
  Filled 2018-12-29 (×18): qty 1

## 2018-12-29 MED ORDER — NAPROXEN 500 MG PO TABS
500.0000 mg | ORAL_TABLET | Freq: Two times a day (BID) | ORAL | Status: DC | PRN
  Administered 2018-12-29 – 2018-12-31 (×2): 500 mg via ORAL
  Filled 2018-12-29 (×2): qty 1

## 2018-12-29 MED ORDER — DICYCLOMINE HCL 20 MG PO TABS: 20.0000 mg | ORAL_TABLET | Freq: Four times a day (QID) | ORAL | Status: DC | PRN

## 2018-12-29 MED ORDER — HYDROXYZINE HCL 25 MG PO TABS
25.0000 mg | ORAL_TABLET | Freq: Four times a day (QID) | ORAL | Status: DC | PRN
  Administered 2018-12-29 – 2019-01-01 (×5): 25 mg via ORAL
  Filled 2018-12-29 (×5): qty 1

## 2018-12-29 MED ORDER — ONDANSETRON 4 MG PO TBDP: 4.0000 mg | ORAL_TABLET | Freq: Four times a day (QID) | ORAL | Status: DC | PRN

## 2018-12-29 MED ORDER — METHOCARBAMOL 500 MG PO TABS
500.0000 mg | ORAL_TABLET | Freq: Three times a day (TID) | ORAL | Status: DC | PRN
  Administered 2018-12-29: 500 mg via ORAL
  Filled 2018-12-29: qty 1

## 2018-12-29 MED ORDER — LOPERAMIDE HCL 2 MG PO CAPS
2.0000 mg | ORAL_CAPSULE | ORAL | Status: DC | PRN
  Administered 2018-12-29: 2 mg via ORAL
  Filled 2018-12-29: qty 1

## 2018-12-29 NOTE — H&P (Signed)
Psychiatric Admission Assessment Adult  Patient Identification: Nicholas Harrison MRN:  [id number] Date of Evaluation:  12/29/2018 Chief Complaint:  Substance Induced Psychosis Principal Diagnosis: <principal problem not specified> Diagnosis:  Active Problems:   Psychoactive substance-induced psychosis (HCC)  History of Present Illness: Patient is seen and examined.  Patient is a 28 year old male with a past psychiatric history significant for opiate use disorder, benzodiazepine use disorder, reported history of depression and anxiety.  The patient presented to the [facility] emergency department on 12/27/2018.  The patient presented with his mother and father.  He presented because his behavior was "different from earlier today".  It is difficult to make out from the notes in the chart but the patient apparently had hallucinations and some form of delusional thoughts.  Patient stated he had been given clonidine (it says in the chart Clonopin, but was clonidine).  He has a long history of opiate dependence, and I believe 1 of his physicians gave him this to assist with withdrawal.  He had some untoward reaction after taking the clonidine.  He also has a history of benzodiazepine dependence, and has been getting those off the street.  During the interview today he denied any auditory or visual hallucinations, and stated that after he did receive the clonidine that he was "out of it and talking out of my head".  He had a drug screen done on 10/1 as well as 10/8.  It was positive for marijuana and oxycodone.  There were no benzodiazepines present.  The patient also stated that in the past he had had a seizure that was most likely related to benzodiazepine withdrawal.  His last legal prescription for Xanax was in 2019.  He stated he had been accessing them off the street.  He also stated that he was snorting oxycodone.  He had previously been treated with buprenorphine in the form of Suboxone as well as subs all  in the past.  The patient's last admission to our facility was in August 2019.  His diagnosis at that time was substance use disorder as well as major depression.  He was discharged on clonidine, Lexapro and Seroquel.  He stated he took the Seroquel for sleep, but it made him feel lethargic.  He received a prescription from his neurologist for Vraylar, but did not receive that until he got admitted to the hospital.  Review of the emergency room notes over the past also showed that the patient had received fluoxetine 20 mg p.o. daily as well as Seroquel and clonidine in the past.  It is unclear why the clonidine would have led to these untoward reactions, but I am sure the benzodiazepines and opiates as well as marijuana could have easily led to this.  He was admitted to the hospital for evaluation and stabilization.   Associated Signs/Symptoms: Depression Symptoms:  anhedonia, insomnia, psychomotor agitation, fatigue, feelings of worthlessness/guilt, difficulty concentrating, hopelessness, suicidal thoughts without plan, anxiety, loss of energy/fatigue, disturbed sleep, (Hypo) Manic Symptoms:  Delusions, Impulsivity, Irritable Mood, Labiality of Mood, Anxiety Symptoms:  Excessive Worry, Psychotic Symptoms:  Delusions, Hallucinations: Auditory Paranoia, PTSD Symptoms: Negative Total Time spent with patient: 30 minutes  Past Psychiatric History: Patient has a multiyear history of polysubstance dependence.  He has been treated for opiate dependence.  He has been on Suboxone as well as subs all before.  He also reports smoking marijuana when he first went to college.  He reports recently snorting oxycodone.  He also admitted to benzodiazepine dependence and getting  Xanax off the street.  Is the patient at risk to self? Yes.    Has the patient been a risk to self in the past 6 months? Yes.    Has the patient been a risk to self within the distant past? No.  Is the patient a risk to others?  No.  Has the patient been a risk to others in the past 6 months? No.  Has the patient been a risk to others within the distant past? No.   Prior Inpatient Therapy:   Prior Outpatient Therapy:    Alcohol Screening: 1. How often do you have a drink containing alcohol?: Never 2. How many drinks containing alcohol do you have on a typical day when you are drinking?: 1 or 2 3. How often do you have six or more drinks on one occasion?: Never AUDIT-C Score: 0 4. How often during the last year have you found that you were not able to stop drinking once you had started?: Never 5. How often during the last year have you failed to do what was normally expected from you becasue of drinking?: Never 6. How often during the last year have you needed a first drink in the morning to get yourself going after a heavy drinking session?: Never 7. How often during the last year have you had a feeling of guilt of remorse after drinking?: Never 8. How often during the last year have you been unable to remember what happened the night before because you had been drinking?: Never 9. Have you or someone else been injured as a result of your drinking?: No 10. Has a relative or friend or a doctor or another health worker been concerned about your drinking or suggested you cut down?: No Alcohol Use Disorder Identification Test Final Score (AUDIT): 0 Substance Abuse History in the last 12 months:  Yes.   Consequences of Substance Abuse: Medical Consequences:  : Several psychiatric hospitalizations, patient is also been in substance rehabilitation program and outpatient substance abuse treatment program Previous Psychotropic Medications: Yes  Psychological Evaluations: Yes  Past Medical History:  Past Medical History:  Diagnosis Date  . Anxiety   . Depression   . Drug-induced seizure (HCC)   . Seizures (HCC)     Past Surgical History:  Procedure Laterality Date  . HAND SURGERY     Family History:  Family  History  Problem Relation Age of Onset  . Depression Mother        anxiety  . Diabetes Maternal Grandfather    Family Psychiatric  History: Father with history of alcohol abuse, mother with history of depression. Tobacco Screening:   Social History:  Social History   Substance and Sexual Activity  Alcohol Use Yes   Comment: occasionally      Social History   Substance and Sexual Activity  Drug Use Yes  . Types: Benzodiazepines, Oxycodone   Comment: pt reports he takes Oxycodone and Xanax, last used 2 weeks ago as of 07/18/18    Additional Social History:                           Allergies:   Allergies  Allergen Reactions  . Buspar [Buspirone] Nausea Only    Dizziness (also) The patient does not think it was problem.  He is willing to try it again  . Seroquel [Quetiapine Fumarate]     Sleepy, vivid dreams   Lab Results:  Results for orders placed  or performed during the hospital encounter of 12/27/18 (from the past 48 hour(s))  Rapid urine drug screen (hospital performed)     Status: Abnormal   Collection Time: 12/27/18  8:59 PM  Result Value Ref Range   Opiates NONE DETECTED NONE DETECTED   Cocaine NONE DETECTED NONE DETECTED   Benzodiazepines NONE DETECTED NONE DETECTED   Amphetamines NONE DETECTED NONE DETECTED   Tetrahydrocannabinol POSITIVE (A) NONE DETECTED   Barbiturates NONE DETECTED NONE DETECTED    Comment: (NOTE) DRUG SCREEN FOR MEDICAL PURPOSES ONLY.  IF CONFIRMATION IS NEEDED FOR ANY PURPOSE, NOTIFY LAB WITHIN 5 DAYS. LOWEST DETECTABLE LIMITS FOR URINE DRUG SCREEN Drug Class                     Cutoff (ng/mL) Amphetamine and metabolites    1000 Barbiturate and metabolites    200 Benzodiazepine                 200 Tricyclics and metabolites     300 Opiates and metabolites        300 Cocaine and metabolites        300 THC                            50 Performed at [facility], [address], [place], [place] [address]   Basic  metabolic panel     Status: Abnormal   Collection Time: 12/27/18  9:59 PM  Result Value Ref Range   Sodium 139 135 - 145 mmol/L   Potassium 3.6 3.5 - 5.1 mmol/L   Chloride 107 98 - 111 mmol/L   CO2 23 22 - 32 mmol/L   Glucose, Bld 107 (H) 70 - 99 mg/dL   BUN 11 6 - 20 mg/dL   Creatinine, Ser 1.91 0.61 - 1.24 mg/dL   Calcium 9.7 8.9 - 47.8 mg/dL   GFR calc non Af Amer >60 >60 mL/min   GFR calc Af Amer >60 >60 mL/min   Anion gap 9 5 - 15    Comment: Performed at [facility], [address], [place], [place] [address]  CBC with Differential     Status: Abnormal   Collection Time: 12/27/18  9:59 PM  Result Value Ref Range   WBC 12.6 (H) 4.0 - 10.5 K/uL   RBC 5.53 4.22 - 5.81 MIL/uL   Hemoglobin 16.0 13.0 - 17.0 g/dL   HCT 13.0 86.5 - 78.4 %   MCV 84.1 80.0 - 100.0 fL   MCH 28.9 26.0 - 34.0 pg   MCHC 34.4 30.0 - 36.0 g/dL   RDW 69.6 29.5 - 28.4 %   Platelets 391 150 - 400 K/uL   nRBC 0.0 0.0 - 0.2 %   Neutrophils Relative % 74 %   Neutro Abs 9.1 (H) 1.7 - 7.7 K/uL   Lymphocytes Relative 18 %   Lymphs Abs 2.3 0.7 - 4.0 K/uL   Monocytes Relative 7 %   Monocytes Absolute 0.9 0.1 - 1.0 K/uL   Eosinophils Relative 1 %   Eosinophils Absolute 0.1 0.0 - 0.5 K/uL   Basophils Relative 0 %   Basophils Absolute 0.1 0.0 - 0.1 K/uL   Immature Granulocytes 0 %   Abs Immature Granulocytes 0.03 0.00 - 0.07 K/uL    Comment: Performed at [facility], [address], [place], [place] [address]  Ethanol     Status: None   Collection Time: 12/27/18  9:59 PM  Result Value Ref Range   Alcohol, Ethyl (B) <10 <10 mg/dL    Comment: (NOTE) Lowest detectable limit for serum alcohol is 10 mg/dL. For medical purposes only. Performed at [facility], [address] [place], [place] [address]   SARS Coronavirus 2 by RT PCR (hospital order, performed in [facility] hospital lab) Nasopharyngeal Nasopharyngeal Swab     Status: None   Collection Time: 12/28/18  9:10 AM   Specimen: Nasopharyngeal Swab   Result Value Ref Range   SARS Coronavirus 2 NEGATIVE NEGATIVE    Comment: (NOTE) If result is NEGATIVE SARS-CoV-2 target nucleic acids are NOT DETECTED. The SARS-CoV-2 RNA is generally detectable in upper and lower  respiratory specimens during the acute phase of infection. The lowest  concentration of SARS-CoV-2 viral copies this assay can detect is 250  copies / mL. A negative result does not preclude SARS-CoV-2 infection  and should not be used as the sole basis for treatment or other  patient management decisions.  A negative result may occur with  improper specimen collection / handling, submission of specimen other  than nasopharyngeal swab, presence of viral mutation(s) within the  areas targeted by this assay, and inadequate number of viral copies  (<250 copies / mL). A negative result must be combined with clinical  observations, patient history, and epidemiological information. If result is POSITIVE SARS-CoV-2 target nucleic acids are DETECTED. The SARS-CoV-2 RNA is generally detectable in upper and lower  respiratory specimens dur ing the acute phase of infection.  Positive  results are indicative of active infection with SARS-CoV-2.  Clinical  correlation with patient history and other diagnostic information is  necessary to determine patient infection status.  Positive results do  not rule out bacterial infection or co-infection with other viruses. If result is PRESUMPTIVE POSTIVE SARS-CoV-2 nucleic acids MAY BE PRESENT.   A presumptive positive result was obtained on the submitted specimen  and confirmed on repeat testing.  While 2019 novel coronavirus  (SARS-CoV-2) nucleic acids may be present in the submitted sample  additional confirmatory testing may be necessary for epidemiological  and / or clinical management purposes  to differentiate between  SARS-CoV-2 and other Sarbecovirus currently known to infect humans.  If clinically indicated additional testing with  an alternate test  methodology [phone]([id number]) is advised. The SARS-CoV-2 RNA is generally  detectable in upper and lower respiratory sp ecimens during the acute  phase of infection. The expected result is Negative. Fact Sheet for Patients:  BoilerBrush.com.cyhttps://www.fda.gov/media/136312/download Fact Sheet for Healthcare Providers: https://pope.com/https://www.fda.gov/media/136313/download This test is not yet approved or cleared by the [place] FDA and has been authorized for detection and/or diagnosis of SARS-CoV-2 by FDA under an Emergency Use Authorization (EUA).  This EUA will remain in effect (meaning this test can be used) for the duration of the COVID-19 declaration under Section 564(b)(1) of the Act, 21 U.S.C. section 360bbb-3(b)(1), unless the authorization is terminated or revoked sooner. Performed at [facility], [address] [place], [place] [address]     Blood Alcohol level:  Lab Results  Component Value Date   [facility] 12/27/2018   ETH <10 07/18/2018    Metabolic Disorder Labs:  Lab Results  Component Value Date   HGBA1C 4.7 (L) 11/09/2017   MPG 88.19 11/09/2017   No results found for: PROLACTIN Lab Results  Component Value Date   CHOL 152 11/09/2017   TRIG 79 11/09/2017   HDL 36 (L) 11/09/2017   CHOLHDL 4.2 11/09/2017   VLDL 16  11/09/2017   LDLCALC 100 (H) 11/09/2017    Current Medications: Current Facility-Administered Medications  Medication Dose Route Frequency Provider Last Rate Last Dose  . B-complex with vitamin C tablet 1 tablet  1 tablet Oral Daily Rankin, Shuvon B, NP      . busPIRone (BUSPAR) tablet 10 mg  10 mg Oral TID Antonieta Pert, MD      . dicyclomine (BENTYL) tablet 20 mg  20 mg Oral Q6H PRN Antonieta Pert, MD      . doxepin (SINEQUAN) capsule 25 mg  25 mg Oral QHS PRN Antonieta Pert, MD      . hydrOXYzine (ATARAX/VISTARIL) tablet 25 mg  25 mg Oral Q6H PRN Antonieta Pert, MD   25 mg at 12/29/18 0854  . loperamide (IMODIUM) capsule 2-4 mg  2-4  mg Oral PRN Antonieta Pert, MD   2 mg at 12/29/18 0854  . LORazepam (ATIVAN) tablet 1 mg  1 mg Oral Q6H PRN Antonieta Pert, MD      . methocarbamol (ROBAXIN) tablet 500 mg  500 mg Oral Q8H PRN Antonieta Pert, MD   500 mg at 12/29/18 0854  . naproxen (NAPROSYN) tablet 500 mg  500 mg Oral BID PRN Antonieta Pert, MD   500 mg at 12/29/18 0854  . ondansetron (ZOFRAN-ODT) disintegrating tablet 4 mg  4 mg Oral Q6H PRN Antonieta Pert, MD      . perphenazine (TRILAFON) tablet 2 mg  2 mg Oral QHS Jola Babinski Marlane Mingle, MD       PTA Medications: Medications Prior to Admission  Medication Sig Dispense Refill Last Dose  . b complex vitamins tablet Take 1 tablet by mouth daily. 100 tablet 3   . busPIRone (BUSPAR) 10 MG tablet Take 1 tablet (10 mg total) by mouth 2 (two) times daily. Start with 5 mg bid x 1 week, then 10 mg bid (Patient taking differently: Take 5-10 mg by mouth See admin instructions. Start with 5 mg twice daily x 1 week starting on 12/27/2018 then 10 mg twice daily) 60 tablet 3   . cariprazine (VRAYLAR) capsule Take 1 capsule (1.5 mg total) by mouth daily. 30 capsule 3   . Cholecalciferol (VITAMIN D3) 50 MCG (2000 UT) capsule Take 1 capsule (2,000 Units total) by mouth daily. 100 capsule 3   . cloNIDine (CATAPRES) 0.1 MG tablet Take 1 tablet (0.1 mg total) by mouth 3 (three) times daily as needed (withdrawal symptoms). 60 tablet 1   . Potassium 99 MG TABS Take 1 tablet by mouth daily.       Musculoskeletal: Strength & Muscle Tone: within normal limits Gait & Station: normal Patient leans: N/A  Psychiatric Specialty Exam: Physical Exam  Nursing note and vitals reviewed. Constitutional: He is oriented to person, place, and time. He appears well-developed and well-nourished.  HENT:  Head: Normocephalic and atraumatic.  Respiratory: Effort normal.  Neurological: He is alert and oriented to person, place, and time.    ROS  Blood pressure 128/89, pulse (!) 107,  temperature 98.2 F (36.8 C), temperature source Oral, resp. rate 18, height  (1.778 m), weight 75.8 kg, SpO2 100 %.Body mass index is 23.96 kg/m.  General Appearance: Casual  Eye Contact:  Fair  Speech:  Normal Rate  Volume:  Normal  Mood:  Anxious and Dysphoric  Affect:  Congruent  Thought Process:  Coherent and Descriptions of Associations: Circumstantial  Orientation:  Full (Time, Place, and Person)  Thought Content:  Logical  Suicidal Thoughts:  No  Homicidal Thoughts:  No  Memory:  Immediate;   Fair Recent;   Fair Remote;   Fair  Judgement:  Impaired  Insight:  Lacking  Psychomotor Activity:  Increased  Concentration:  Concentration: Fair and Attention Span: Fair  Recall:  [employer] of Knowledge:  Fair  Language:  Fair  Akathisia:  Negative  Handed:  Right  AIMS (if indicated):     Assets:  Desire for Improvement Resilience  ADL's:  Intact  Cognition:  WNL  Sleep:  Number of Hours: 1    Treatment Plan Summary: Daily contact with patient to assess and evaluate symptoms and progress in treatment, Medication management and Plan : Patient is seen and examined.  Patient is a 28 year old male with the above-stated past psychiatric history who was admitted after confusion and agitation that was most likely related to substance withdrawal syndromes.  He was admitted to the hospital for evaluation and stabilization.  He will be admitted to the hospital.  He will be integrated into the milieu.  He will be encouraged to attend groups.  I do not really think the clonidine led to the confusion, and I think that most likely it was related to withdrawal or intoxication of substances.  I will place him on the opiate detox protocol, but I will eliminate the clonidine from that.  He did admit to problems with sleep, and did not sleep well last night.  He admitted to lethargy from Seroquel in the past.  I am unsure on why he would be given Vraylar except if they thought his psychotic  symptoms were of a mood disorder and not from withdrawal.  He stated that previously that when he took the Seroquel he also took it with trazodone, and that the trazodone made his nightmares worse.  I am going to stop the Vraylar and place him on 25 mg of Seroquel and titrate that during course the hospitalization.  I will stop the trazodone that written for, and put down doxepin.  The doxepin will be as a PRN only.  He has been taking BuSpar and we will continue that for now.  Hopefully we will be able to evaluate him more fully during the course the hospitalization and tease out these other issues.  On impression his electrolytes were all essentially normal.  Liver function enzymes were normal.  His CBC with differential was normal.  His TSH was mildly elevated at 5.51.  Drug screen results as per above.  This morning his vital signs are stable, he is afebrile.  He only slept 1 hour last night.  Observation Level/Precautions:  Detox 15 minute checks Seizure  Laboratory:  Chemistry Profile  Psychotherapy:    Medications:    Consultations:    Discharge Concerns:    Estimated LOS:  Other:     Physician Treatment Plan for Primary Diagnosis: <principal problem not specified> Long Term Goal(s): Improvement in symptoms so as ready for discharge  Short Term Goals: Ability to identify changes in lifestyle to reduce recurrence of condition will improve, Ability to verbalize feelings will improve, Ability to disclose and discuss suicidal ideas, Ability to demonstrate self-control will improve, Ability to identify and develop effective coping behaviors will improve, Ability to maintain clinical measurements within normal limits will improve, Compliance with prescribed medications will improve and Ability to identify triggers associated with substance abuse/mental health issues will improve  Physician Treatment Plan for Secondary Diagnosis: Active Problems:   Psychoactive substance-induced psychosis  (  HCC)  Long Term Goal(s): Improvement in symptoms so as ready for discharge  Short Term Goals: Ability to identify changes in lifestyle to reduce recurrence of condition will improve, Ability to verbalize feelings will improve, Ability to disclose and discuss suicidal ideas, Ability to demonstrate self-control will improve, Ability to identify and develop effective coping behaviors will improve, Ability to maintain clinical measurements within normal limits will improve, Compliance with prescribed medications will improve and Ability to identify triggers associated with substance abuse/mental health issues will improve  I certify that inpatient services furnished can reasonably be expected to improve the patient's condition.    Antonieta Pert, MD 10/10/202011:38 AM

## 2018-12-29 NOTE — [facility] Group Notes (Signed)
  [facility] LCSW Group Therapy Note  Date/Time:  12/29/2018 11:15AM-12:00PM  Type of Therapy and Topic:  Group Therapy:  Feelings About Hospitalization  Participation Level:  Active   Description of Group This process group involved patients discussing their feelings related to being hospitalized, as well as the benefits they see to being in the hospital.  These feelings and benefits were itemized.  The group then brainstormed specific ways in which they could seek those same benefits when they discharge and return home.  Therapeutic Goals 1. Patient will identify and describe positive and negative feelings related to hospitalization 2. Patient will verbalize benefits of hospitalization to themselves personally 3. Patients will brainstorm together ways they can obtain similar benefits in the outpatient setting, identify barriers to wellness and possible solutions  Summary of Patient Progress:  The patient expressed his primary feelings about being hospitalized are guilty, because he did not want to have to reenter into treatment again.  He feels he "let myself get in this place."  He realizes he needs to let somebody help him and needs to do something different.  He was active throughout group.  Therapeutic Modalities Cognitive Behavioral Therapy Motivational Interviewing    Selmer Dominion, LCSW 12/29/2018, 2:11 PM

## 2018-12-29 NOTE — Progress Notes (Addendum)
Patient ID: Nicholas Harrison, male   DOB: [birth year], 28 y.o.   MRN: [id number]  [place] NOVEL CORONAVIRUS (COVID-19) DAILY CHECK-OFF SYMPTOMS - answer yes or no to each - every day NO YES  Have you had a fever in the past 24 hours?  . Fever (Temp > 37.80C / 100F) X   Have you had any of these symptoms in the past 24 hours? . New Cough .  Sore Throat  .  Shortness of Breath .  Difficulty Breathing .  Unexplained Body Aches   X   Have you had any one of these symptoms in the past 24 hours not related to allergies?   . Runny Nose .  Nasal Congestion .  Sneezing   X   If you have had runny nose, nasal congestion, sneezing in the past 24 hours, has it worsened?  X   EXPOSURES - check yes or no X   Have you traveled outside the state in the past 14 days?  X   Have you been in contact with someone with a confirmed diagnosis of COVID-19 or PUI in the past 14 days without wearing appropriate PPE?  X   Have you been living in the same home as a person with confirmed diagnosis of COVID-19 or a PUI (household contact)?    X   Have you been diagnosed with COVID-19?    X              What to do next: Answered NO to all: Answered YES to anything:   Proceed with unit schedule Follow the [facility] Inpatient Flowsheet.

## 2018-12-29 NOTE — Progress Notes (Addendum)
Pt talking throughout the night in his bed , loud enough to be heard down the hallway

## 2018-12-29 NOTE — Progress Notes (Signed)
Adult Psychoeducational Group Note  Date:  12/29/2018 Time:  10:00 PM  Group Topic/Focus:  Wrap-Up Group:   The focus of this group is to help patients review their daily goal of treatment and discuss progress on daily workbooks.  Participation Level:  Appropriate  Participation Quality:  Appropriate  Affect:  Appropriate  Cognitive:  Appropriate  Insight: Appropriate  Engagement in Group:  Developing/Improving  Modes of Intervention:  Discussion  Additional Comments:  Pt stated his goal for today was to focus on his treatment plan. Pt stated he felt he accomplished his goal today. Pt stated his relationship with his family has improved since he was admitted here. Pt stated he was able to contact his mother and father today, which help improve his day. Pt stated he felt better about himself today. Pt rated his overall day a 7 out of 10. Pt stated his appetite was pretty good today. Pt stated his goal for tonight is to get some rest. Pt did complain of having a headache tonight. Pt's nurse was made aware of the situation.  Pt stated he was not hearing or seeing anything that was not there. Pt stated he had no thoughts of harming himself or others. Pt stated he would alert staff if anything changes. Candy Sledge 12/29/2018, 10:00 PM

## 2018-12-29 NOTE — Plan of Care (Signed)
  Problem: Education: Goal: Verbalization of understanding the information provided will improve Outcome: Progressing   Problem: Activity: Goal: Interest or engagement in activities will improve Outcome: Progressing   

## 2018-12-29 NOTE — Progress Notes (Addendum)
Adult Psychoeducational Group Note  Date:  12/29/2018 Time:  5:24 AM  Group Topic/Focus:  Wrap-Up Group:   The focus of this group is to help patients review their daily goal of treatment and discuss progress on daily workbooks.  Participation Level:  Active  Participation Quality:  Appropriate  Affect:  Appropriate  Cognitive:  Appropriate  Insight: Appropriate  Engagement in Group:  Developing/Improving  Modes of Intervention:  Discussion  Additional Comments: Pt stated his goal for today was to focus on his treatment plan. Pt stated he felt he accomplished his goal today. Pt stated his relationship with his family has improved since he was admitted here. Pt stated he was able to contact his mother and father today, which help improve his day. Pt stated he felt better about himself today. Pt rated his overall day a 8 out of 10. Pt stated his appetite was pretty good today. Pt stated his goal for tonight is to get some rest. Pt did not complain of pain tonight.  Pt stated he was not hearing or seeing anything that was not there. Pt stated he had no thoughts of harming himself or others. Pt stated he would alert staff if anything changes. Candy Sledge 12/29/2018, 5:24 AM

## 2018-12-29 NOTE — Progress Notes (Signed)
Pt talking in his sleep , which was problematic for his roommate requiring earplugs for roommate

## 2018-12-29 NOTE — BHH Counselor (Signed)
Adult Comprehensive Assessment  Patient ID: Nicholas Harrison, male   DOB: Jun 06, 1990, 28 y.o.   MRN: [id number]  Information Source: Information source: Patient  Current Stressors:  Patient states their primary concerns and needs for treatment are:: "pain pill and xanax use, but I've already detoxed myself." Patient states their goals for this hospitilization and ongoing recovery are:: "Find transition back to a step down like a halfway house." Educational / Learning stressors: Denies stressors, although past assessment states he has never felt book-smart and was pressured to attend college. Employment / Job issues: Needs his workdays to be more structured.  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 Family Relationships: When he is using, his relationships with family are strained.  He continues to be disturbed by his parents' divorce. Financial / Lack of resources (include bankruptcy): States finances are only stressful when he is using. Housing / Lack of housing: Does not want to return to his current housing. Physical health (include injuries & life threatening diseases): Denies stressors. Social relationships: States his social relationships are strained when he is using. Substance abuse: States he is going to die if he does not stop using. Bereavement / Loss: Has lost people due to drugs.  Living/Environment/Situation:  Living Arrangements: Parent Living conditions (as described by patient or guardian): Good Who else lives in the home?: Father How long has patient lived in current situation?: 11/2017 What is atmosphere in current home: Comfortable, Loving, Supportive, Other (Comment)("no structure")  Family History:  Marital status: Single What is your sexual orientation?: Heterosexual  Does patient have children?: No  Childhood History:  By whom was/is the patient raised?: Both parents Additional childhood history information: "I had a  good childhood, I went to church and youth group. I was involved in everything. I had my stuff together and I had everything aligned to have a good life." Patient states he has taken advantage of his parents. 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  Description of patient's relationship with caregiver when they were a child: Patient reports supportive relationships with both of his parents growing up.  Patient's description of current relationship with people who raised him/her: Good How were you disciplined when you got in trouble as a child/adolescent?: "Spankings and stuff." Denies excessive physical beatings.  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  How long has patient been employed?: Since he returned from [employer] in 2014.  Patient's job has been impacted by current illness: Yes Describe how patient's job has been impacted: Patient states "I want to be lazy." Patient believes he could help the business grow if it weren't for his mental health/substance abuse issues. Patient stated he has stolen from the business, has accepted pills instead of money. Patient goes to work high daily. States his family does not always know when he is high.  What is the longest  time patient has a held a job?: This employment is his longest period of employment.  Where was  the patient employed at that time?: [employer].  Did You Receive Any Psychiatric Treatment/Services While in the [place]?: (no [employer]) Are There Guns or Other Weapons in [place]?: Yes Types of Guns/Weapons: Patient states he has guns and is a "gun fanatic." Are These Weapons Safely Secured?: No Who Could Verify You Are Able To Have These Secured:: Patient's father, who has said he will move the guns.  Financial Resources:   Financial resources: Income from employment, Private insurance Does patient have a representative payee or guardian?: No  Alcohol/Substance Abuse:   What has been your use of drugs/alcohol within the last 12 months?: Smokes marijuana daily, snorts opiates daily, snorts benzodiazepines daily or whenever he can get them, has been snorting powder cocaine about twice a month Alcohol/Substance Abuse Treatment Hx: Past Tx, Inpatient, Past Tx, Outpatient, Past detox, Attends AA/NA If yes, describe treatment: [facility] in 2017 for 30 days; [facility] in 2018 and 2019 for 30 days each time; [facility] in the past; [facility] in the past, stayed in an [facility] just one week and was asked to leave because remained using drugs. Has alcohol/substance abuse ever caused legal problems?: Yes  Social Support System:   Patient's Community Support System: Good Describe Community Support System: Parents, friends Type of faith/religion: Darrick Meigs How does patient's faith help to cope with current illness?: "Need to get back into church"  Leisure/Recreation:   Leisure and Hobbies: Patient enjoys playing golf, going to car sales with his dad.   Strengths/Needs:   What is the patient's perception of their strengths?: Talking to people Patient states they can use these personal strengths during their treatment to contribute to their recovery: Lead NA meetings/chair Patient states these barriers may affect/interfere with their treatment:  None Patient states these barriers may affect their return to the community: None Other important information patient would like considered in planning for their treatment: None  Discharge Plan:   Currently receiving community mental health services: No Patient states concerns and preferences for aftercare planning are: Does not want to go to rehab.  Would like to get into an [facility] and get a referral to [facility] for medication management and therapy. Patient states they will know when they are safe and ready for discharge when: Anxiety under control, plan in place Does patient have access to transportation?: Yes Does patient have financial barriers related to discharge medications?: No Patient description of barriers related to discharge medications: Has income and insurance Plan for living situation after discharge: Wants to go to a halfway house Will patient be returning to same living situation after discharge?: No  Summary/Recommendations:   Summary and Recommendations (to be completed by the evaluator): Patient is a 28yo male last at [facility] in August 2019, readmitted with hallucinations, delusional thoughts about COVID-19, and paranoia.  He also threatened to shoot himself with a gun.  He reports smoking marijuana and snorting opiates daily, snorting benzodiazepines as often as he can get them which is sometimes daily, and snorting powder cocaine about two times a month.  Primary stressors include his substance use which compromises everything in his life when he is using, and he feels he is going to die if he does not stop.  Patient will benefit from crisis stabilization, medication evaluation, group therapy and psychoeducation, in addition to case management for  discharge planning. At discharge it is recommended that Patient adhere to the established discharge plan and continue in treatment.  Lynnell ChadMareida J Grossman-Orr. 12/29/2018

## 2018-12-29 NOTE — BHH Suicide Risk Assessment (Signed)
[facility] Admission Suicide Risk Assessment   Nursing information obtained from:  Patient Demographic factors:  Male, Caucasian, Adolescent or young adult Current Mental Status:  NA Loss Factors:  NA Historical Factors:  NA Risk Reduction Factors:  Living with another person, especially a relative  Total Time spent with patient: 30 minutes Principal Problem: <principal problem not specified> Diagnosis:  Active Problems:   Psychoactive substance-induced psychosis (HCC)  Subjective Data: Patient is seen and examined.  Patient is a 28 year old male with a past psychiatric history significant for opiate use disorder, benzodiazepine use disorder, reported history of depression and anxiety.  The patient presented to the [facility] emergency department on 12/27/2018.  The patient presented with his mother and father.  He presented because his behavior was "different from earlier today".  It is difficult to make out from the notes in the chart but the patient apparently had hallucinations and some form of delusional thoughts.  Patient stated he had been given clonidine (it says in the chart Clonopin, but was clonidine).  He has a long history of opiate dependence, and I believe 1 of his physicians gave him this to assist with withdrawal.  He had some untoward reaction after taking the clonidine.  He also has a history of benzodiazepine dependence, and has been getting those off the street.  During the interview today he denied any auditory or visual hallucinations, and stated that after he did receive the clonidine that he was "out of it and talking out of my head".  He had a drug screen done on 10/1 as well as 10/8.  It was positive for marijuana and oxycodone.  There were no benzodiazepines present.  The patient also stated that in the past he had had a seizure that was most likely related to benzodiazepine withdrawal.  His last legal prescription for Xanax was in 2019.  He stated he had been accessing them off  the street.  He also stated that he was snorting oxycodone.  He had previously been treated with buprenorphine in the form of Suboxone as well as subs all in the past.  The patient's last admission to our facility was in August 2019.  His diagnosis at that time was substance use disorder as well as major depression.  He was discharged on clonidine, Lexapro and Seroquel.  He stated he took the Seroquel for sleep, but it made him feel lethargic.  He received a prescription from his neurologist for Vraylar, but did not receive that until he got admitted to the hospital.  Review of the emergency room notes over the past also showed that the patient had received fluoxetine 20 mg p.o. daily as well as Seroquel and clonidine in the past.  It is unclear why the clonidine would have led to these untoward reactions, but I am sure the benzodiazepines and opiates as well as marijuana could have easily led to this.  He was admitted to the hospital for evaluation and stabilization.  Continued Clinical Symptoms:  Alcohol Use Disorder Identification Test Final Score (AUDIT): 0 The "Alcohol Use Disorders Identification Test", Guidelines for Use in Primary Care, Second Edition.  World Science writer [facility]). Score between 0-7:  no or low risk or alcohol related problems. Score between 8-15:  moderate risk of alcohol related problems. Score between 16-19:  high risk of alcohol related problems. Score 20 or above:  warrants further diagnostic evaluation for alcohol dependence and treatment.   CLINICAL FACTORS:   Severe Anxiety and/or Agitation Depression:  Anhedonia Comorbid alcohol abuse/dependence Delusional Impulsivity Insomnia Alcohol/Substance Abuse/Dependencies   Musculoskeletal: Strength & Muscle Tone: within normal limits Gait & Station: normal Patient leans: N/A  Psychiatric Specialty Exam: Physical Exam  Nursing note and vitals reviewed. Constitutional: He is oriented to person, place, and  time. He appears well-developed and well-nourished.  HENT:  Head: Normocephalic and atraumatic.  Respiratory: Effort normal.  Neurological: He is alert and oriented to person, place, and time.    ROS  Blood pressure 128/89, pulse (!) 107, temperature 98.2 F (36.8 C), temperature source Oral, resp. rate 18, height 5\' 10"  (1.778 m), weight 75.8 kg, SpO2 100 %.Body mass index is 23.96 kg/m.  General Appearance: Casual  Eye Contact:  Fair  Speech:  Normal Rate  Volume:  Normal  Mood:  Anxious and Dysphoric  Affect:  Congruent  Thought Process:  Coherent and Descriptions of Associations: Circumstantial  Orientation:  Full (Time, Place, and Person)  Thought Content:  Logical  Suicidal Thoughts:  No  Homicidal Thoughts:  No  Memory:  Immediate;   Fair Recent;   Fair Remote;   Fair  Judgement:  Intact  Insight:  Fair  Psychomotor Activity:  Increased  Concentration:  Concentration: Fair and Attention Span: Fair  Recall:  [employer] of Knowledge:  Fair  Language:  Fair  Akathisia:  Negative  Handed:  Right  AIMS (if indicated):     Assets:  Desire for Improvement Resilience  ADL's:  Intact  Cognition:  WNL  Sleep:  Number of Hours: 1      COGNITIVE FEATURES THAT CONTRIBUTE TO RISK:  None    SUICIDE RISK:   Minimal: No identifiable suicidal ideation.  Patients presenting with no risk factors but with morbid ruminations; may be classified as minimal risk based on the severity of the depressive symptoms  PLAN OF CARE: Patient is seen and examined.  Patient is a 28 year old male with the above-stated past psychiatric history who was admitted after confusion and agitation that was most likely related to substance withdrawal syndromes.  He was admitted to the hospital for evaluation and stabilization.  He will be admitted to the hospital.  He will be integrated into the milieu.  He will be encouraged to attend groups.  I do not really think the clonidine led to the confusion, and I  think that most likely it was related to withdrawal or intoxication of substances.  I will place him on the opiate detox protocol, but I will eliminate the clonidine from that.  He did admit to problems with sleep, and did not sleep well last night.  He admitted to lethargy from Seroquel in the past.  I am unsure on why he would be given Vraylar except if they thought his psychotic symptoms were of a mood disorder and not from withdrawal.  He stated that previously that when he took the Seroquel he also took it with trazodone, and that the trazodone made his nightmares worse.  I am going to stop the Vraylar and place him on 25 mg of Seroquel and titrate that during course the hospitalization.  I will stop the trazodone that written for, and put down doxepin.  The doxepin will be as a PRN only.  He has been taking BuSpar and we will continue that for now.  Hopefully we will be able to evaluate him more fully during the course the hospitalization and tease out these other issues.  On impression his electrolytes were all essentially normal.  Liver function  enzymes were normal.  His CBC with differential was normal.  His TSH was mildly elevated at 5.51.  Drug screen results as per above.  This morning his vital signs are stable, he is afebrile.  He only slept 1 hour last night.  I certify that inpatient services furnished can reasonably be expected to improve the patient's condition.   Antonieta PertGreg Lawson , MD 12/29/2018, 8:09 AM

## 2018-12-29 NOTE — Progress Notes (Signed)
D: Pt denies SI/HI/AVH. Pt was not forthcoming with information and minimizing his stay here, pt guarded at times.  A: Pt was offered support and encouragement. Pt was encourage to attend groups. Q 15 minute checks were done for safety.   R:Pt attends groups and interacts well with peers and staff. Pt is taking medication.Pt receptive to treatment and safety maintained on unit.

## 2018-12-29 NOTE — BHH Counselor (Signed)
Clinical Social Work Note  Patient was given a Building surveyor of male [employer] in [place], [place] which contains contact information for 30 houses in this area, as that is what patient stated he wants to pursue for housing at discharge.  Selmer Dominion, LCSW 12/29/2018, 2:40 PM

## 2018-12-30 NOTE — Progress Notes (Signed)
BHH Group Notes:  (Nursing/MHT/Case Management/Adjunct)  Date:  12/30/2018  Time:  0900 am  Type of Therapy:  Nurse Education  Participation Level:  Did Not Attend   Claribel Sachs L 

## 2018-12-30 NOTE — Progress Notes (Signed)
Adult Psychoeducational Group Note  Date:  12/30/2018 Time:  10:34 PM  Group Topic/Focus:  Wrap-Up Group:   The focus of this group is to help patients review their daily goal of treatment and discuss progress on daily workbooks.  Participation Level:  Active  Participation Quality:  Appropriate  Affect:  Appropriate  Cognitive:  Appropriate  Insight: Appropriate  Engagement in Group:  Developing/Improving  Modes of Intervention:  Discussion  Additional Comments: Pt stated his goal for today was to focus on his treatment plan. Pt stated he felt he accomplished his goal today. Pt stated his relationship with his family has improved since he was admitted here.Pt stated he was able to contact his mother and father today, which help improve his day.Pt stated he felt better about himself today. Pt rated his overall day a8 out of10. Pt stated his appetite was pretty good today. Pt stated his goal for tonight is to get some rest. Pt did not complain of pain tonight. Pt stated he was not hearing or seeing anything that was not there. Pt stated he had no thoughts of harming himself or others. Pt stated he would alert staff if anything changes.Candy Sledge 12/30/2018, 10:34 PM

## 2018-12-30 NOTE — Progress Notes (Signed)
[facility] MD Progress Note  12/30/2018 11:24 AM Nicholas Harrison  MRN:  [id number] Subjective:  Patient is a 28 year old male with a past psychiatric history significant for opiate use disorder, benzodiazepine use disorder, reported history of depression and anxiety. The patient presented to the [facility] emergency department on 12/27/2018. The patient presented with his mother and father. He presented because his behavior was "different from earlier today". It is difficult to make out from the notes in the chart but the patient apparently had hallucinations and some form of delusional thoughts. Patient stated he had been given clonidine (it says in the chart Clonopin, but was clonidine). He has a long history of opiate dependence, and I believe 1 of his physicians gave him this to assist with withdrawal. He had some untoward reaction after taking the clonidine.  Objective: Patient is seen and examined.  Patient is a 28 year old male with the above-stated past psychiatric history who is seen in follow-up.  Patient is doing better today.  He is much more organized.  He stated he had talk to social work about [employer] as well as rehabilitation facilities.  He denied any auditory or visual hallucinations.  He denied any suicidal or homicidal ideation.  His vital signs are stable, he has a low-grade temperature at 99.5.  He slept 6.25 hours last night.  Principal Problem: <principal problem not specified> Diagnosis: Active Problems:   Psychoactive substance-induced psychosis ([place])  Total Time spent with patient: 20 minutes  Past Psychiatric History: See admission H&P  Past Medical History:  Past Medical History:  Diagnosis Date  . Anxiety   . Depression   . Drug-induced seizure ([place])   . Seizures ([place])     Past Surgical History:  Procedure Laterality Date  . HAND SURGERY     Family History:  Family History  Problem Relation Age of Onset  . Depression Mother        anxiety  .  Diabetes Maternal Grandfather    Family Psychiatric  History: See admission H&P Social History:  Social History   Substance and Sexual Activity  Alcohol Use Yes   Comment: occasionally      Social History   Substance and Sexual Activity  Drug Use Yes  . Types: Benzodiazepines, Oxycodone   Comment: pt reports he takes Oxycodone and Xanax, last used 2 weeks ago as of 07/18/18    Social History   Socioeconomic History  . Marital status: Single    Spouse name: Not on file  . Number of children: Not on file  . Years of education: Not on file  . Highest education level: Not on file  Occupational History  . Not on file  Social Needs  . Financial resource strain: Not on file  . Food insecurity    Worry: Not on file    Inability: Not on file  . Transportation needs    Medical: Not on file    Non-medical: Not on file  Tobacco Use  . Smoking status: Current Some Day Smoker  . Smokeless tobacco: Former Systems developer    Types: Chew  . Tobacco comment: only experimented  Substance and Sexual Activity  . Alcohol use: Yes    Comment: occasionally   . Drug use: Yes    Types: Benzodiazepines, Oxycodone    Comment: pt reports he takes Oxycodone and Xanax, last used 2 weeks ago as of 07/18/18  . Sexual activity: Not Currently  Lifestyle  . Physical activity    Days per week:  Not on file    Minutes per session: Not on file  . Stress: Not on file  Relationships  . Social Musician on phone: Not on file    Gets together: Not on file    Attends religious service: Not on file    Active member of club or organization: Not on file    Attends meetings of clubs or organizations: Not on file    Relationship status: Not on file  Other Topics Concern  . Not on file  Social History Narrative  . Not on file   Additional Social History:                         Sleep: Fair  Appetite:  Fair  Current Medications: Current Facility-Administered Medications  Medication Dose  Route Frequency Provider Last Rate Last Dose  . B-complex with vitamin C tablet 1 tablet  1 tablet Oral Daily Rankin, Shuvon B, NP   1 tablet at 12/30/18 0900  . busPIRone (BUSPAR) tablet 10 mg  10 mg Oral TID Antonieta Pert, MD   10 mg at 12/30/18 0900  . dicyclomine (BENTYL) tablet 20 mg  20 mg Oral Q6H PRN Antonieta Pert, MD      . doxepin [facility]) capsule 25 mg  25 mg Oral QHS PRN Antonieta Pert, MD      . hydrOXYzine (ATARAX/VISTARIL) tablet 25 mg  25 mg Oral Q6H PRN Antonieta Pert, MD   25 mg at 12/29/18 1757  . loperamide (IMODIUM) capsule 2-4 mg  2-4 mg Oral PRN Antonieta Pert, MD   2 mg at 12/29/18 0854  . LORazepam (ATIVAN) tablet 1 mg  1 mg Oral Q6H PRN Antonieta Pert, MD      . methocarbamol (ROBAXIN) tablet 500 mg  500 mg Oral Q8H PRN Antonieta Pert, MD   500 mg at 12/29/18 0854  . naproxen (NAPROSYN) tablet 500 mg  500 mg Oral BID PRN Antonieta Pert, MD   500 mg at 12/29/18 0854  . ondansetron (ZOFRAN-ODT) disintegrating tablet 4 mg  4 mg Oral Q6H PRN Antonieta Pert, MD      . perphenazine (TRILAFON) tablet 2 mg  2 mg Oral QHS Antonieta Pert, MD   2 mg at 12/29/18 2049    Lab Results: No results found for this or any previous visit (from the past 48 hour(s)).  Blood Alcohol level:  Lab Results  Component Value Date   ETH <10 12/27/2018   ETH <10 07/18/2018    Metabolic Disorder Labs: Lab Results  Component Value Date   HGBA1C 4.7 (L) 11/09/2017   MPG 88.19 11/09/2017   No results found for: PROLACTIN Lab Results  Component Value Date   CHOL 152 11/09/2017   TRIG 79 11/09/2017   HDL 36 (L) 11/09/2017   CHOLHDL 4.2 11/09/2017   VLDL 16 11/09/2017   LDLCALC 100 (H) 11/09/2017    Physical Findings: AIMS:  , ,  ,  ,    CIWA:    COWS:  COWS Total Score: 1  Musculoskeletal: Strength & Muscle Tone: within normal limits Gait & Station: normal Patient leans: N/A  Psychiatric Specialty Exam: Physical Exam  Nursing note  and vitals reviewed. Constitutional: He is oriented to person, place, and time. He appears well-developed and well-nourished.  HENT:  Head: Normocephalic and atraumatic.  Respiratory: Effort normal.  Neurological: He is alert and oriented to person,  place, and time.    ROS  Blood pressure (!) 123/92, pulse 89, temperature 99.5 F (37.5 C), temperature source Oral, resp. rate 18, height 5\' 10"  (1.778 m), weight 75.8 kg, SpO2 100 %.Body mass index is 23.96 kg/m.  General Appearance: Casual  Eye Contact:  Good  Speech:  Normal Rate  Volume:  Normal  Mood:  Anxious  Affect:  Congruent  Thought Process:  Coherent and Descriptions of Associations: Intact  Orientation:  Full (Time, Place, and Person)  Thought Content:  Logical  Suicidal Thoughts:  No  Homicidal Thoughts:  No  Memory:  Immediate;   Fair Recent;   Fair Remote;   Fair  Judgement:  Intact  Insight:  Fair  Psychomotor Activity:  Increased  Concentration:  Concentration: Fair and Attention Span: Fair  Recall:  [employer]Fair  Fund of Knowledge:  Fair  Language:  Good  Akathisia:  Negative  Handed:  Right  AIMS (if indicated):     Assets:  Desire for Improvement Resilience  ADL's:  Intact  Cognition:  WNL  Sleep:  Number of Hours: 6.25     Treatment Plan Summary: Daily contact with patient to assess and evaluate symptoms and progress in treatment, Medication management and Plan : Patient is seen and examined.  Patient is a 28 year old male with the above-stated past psychiatric history who is seen in follow-up.   Diagnosis: #1 opiate dependence, #2 benzodiazepine dependence, #3 major depression versus substance-induced mood disorder, #4 generalized anxiety disorder versus substance-induced anxiety disorder.  Patient is seen in follow-up.  He is doing better today.  Much less disorganized, more put together, did sleep well.  He slept well with Trilafon 2 mg p.o. nightly., and he does have doxepin PRN for insomnia.  I did  restart his BuSpar, and that will continue.  He has lorazepam available for CIWA greater than 10.  He will continue to work on I will continue to hold antidepressant medicines at this time.  He is working with social work on getting into a rehab facility or an [facility] house.  No changes in his medications today, hopefully will continue to improve. 1.  Continue BuSpar 10 mg p.o. 3 times daily for anxiety. 2.  Continue opiate detox protocol minus the clonidine. 3.  Continue doxepin 25 mg p.o. nightly as needed insomnia. 4.  Continue lorazepam 1 mg p.o. every 6 hours PRN a CIWA greater than 10. 5.  Continue perphenazine 2 mg p.o. nightly for sleep, as well as mood stability and psychosis. 6.  Disposition planning-in progress.  Antonieta PertGreg Lawson Madie Cahn, MD 12/30/2018, 11:24 AM

## 2018-12-30 NOTE — Progress Notes (Signed)
[place] NOVEL CORONAVIRUS (COVID-19) DAILY CHECK-OFF SYMPTOMS - answer yes or no to each - every day NO YES  Have you had a fever in the past 24 hours?  . Fever (Temp > 37.80C / 100F) X   Have you had any of these symptoms in the past 24 hours? . New Cough .  Sore Throat  .  Shortness of Breath .  Difficulty Breathing .  Unexplained Body Aches   X   Have you had any one of these symptoms in the past 24 hours not related to allergies?   . Runny Nose .  Nasal Congestion .  Sneezing   X   If you have had runny nose, nasal congestion, sneezing in the past 24 hours, has it worsened?  X   EXPOSURES - check yes or no X   Have you traveled outside the state in the past 14 days?  X   Have you been in contact with someone with a confirmed diagnosis of COVID-19 or PUI in the past 14 days without wearing appropriate PPE?  X   Have you been living in the same home as a person with confirmed diagnosis of COVID-19 or a PUI (household contact)?    X   Have you been diagnosed with COVID-19?    X              What to do next: Answered NO to all: Answered YES to anything:   Proceed with unit schedule Follow the [facility] Inpatient Flowsheet.   

## 2018-12-30 NOTE — Progress Notes (Addendum)
D. Pt is pleasant during interactions-voices no complaints. Pt observed in the milieu this am, but did not attend groups. Pt  denies SI/HI and AVH  A. Labs and vitals monitored. Pt compliant with  medications. Pt supported emotionally and encouraged to express concerns and ask questions.   R. Pt remains safe with 15 minute checks. Will continue POC.

## 2018-12-30 NOTE — Progress Notes (Signed)
Patient has been up and active on the unit, attended group this evening and has voiced no complaints. Patient currently denies having pain, -si/hi/a/v hall. Support and encouragement offered, safety maintained on unit, will continue to monitor.  

## 2018-12-30 NOTE — Progress Notes (Signed)
Writer observed patient up in the dayroom watching tv. Writer spoke with him 1:1 and he reports feeling fine and had a good day. He is pleasant and interacts with peers appropriately. Support given and safety maintained with 15 min checks.

## 2018-12-30 NOTE — [facility] Group Notes (Signed)
[facility] LCSW Group Therapy Note  Date/Time:  12/30/2018  11:00AM-12:00PM  Type of Therapy and Topic:  Group Therapy:  Music and Mood  Participation Level:  Minimal   Description of Group: In this process group, members listened to a variety of genres of music and identified that different types of music evoke different responses.  Patients were encouraged to identify music that was soothing for them and music that was energizing for them.  Patients discussed how this knowledge can help with wellness and recovery in various ways including managing depression and anxiety as well as encouraging healthy sleep habits.    Therapeutic Goals: 1. Patients will explore the impact of different varieties of music on mood 2. Patients will verbalize the thoughts they have when listening to different types of music 3. Patients will identify music that is soothing to them as well as music that is energizing to them 4. Patients will discuss how to use this knowledge to assist in maintaining wellness and recovery 5. Patients will explore the use of music as a coping skill  Summary of Patient Progress:  At the beginning of group, patient was not present, did not arrive until the last 20 minutes or so and did not stay for the entire time.  At the end of group, patient expressed that he had been feeling very sluggish from medication he took last night, but was feeling less sluggish as the day progresses.    Therapeutic Modalities: Solution Focused Brief Therapy Activity   Selmer Dominion, LCSW

## 2018-12-31 MED ORDER — GABAPENTIN 300 MG PO CAPS
300.0000 mg | ORAL_CAPSULE | Freq: Three times a day (TID) | ORAL | Status: DC
  Administered 2018-12-31 – 2019-01-02 (×6): 300 mg via ORAL
  Filled 2018-12-31 (×13): qty 1

## 2018-12-31 MED ORDER — CARBAMAZEPINE 100 MG PO CHEW
100.0000 mg | CHEWABLE_TABLET | Freq: Two times a day (BID) | ORAL | Status: DC
  Administered 2018-12-31 – 2019-01-01 (×2): 100 mg via ORAL
  Filled 2018-12-31 (×4): qty 1

## 2018-12-31 NOTE — Plan of Care (Signed)
?  Problem: Coping: ?Goal: Ability to verbalize frustrations and anger appropriately will improve ?Outcome: Progressing ?Goal: Ability to demonstrate self-control will improve ?Outcome: Progressing ?  ?Problem: Health Behavior/Discharge Planning: ?Goal: Compliance with treatment plan for underlying cause of condition will improve ?Outcome: Progressing ?  ?

## 2018-12-31 NOTE — Progress Notes (Signed)
Recreation Therapy Notes  Date: 10.12.20 Time: 1000 Location: 400 Hall Dayroom  Group Topic: Coping Skills  Goal Area(s) Addresses:  Patient will identify positive coping skills. Patient will identify benefits of using positive coping strategies. Patient will identify how coping skills can be beneficial post d/c.  Intervention:  Worksheet, pencils  Activity:  Building surveyor.  Patients were given a blank Building surveyor.  Inside the web, patients were to identify and label what things/issues have held them back or stuck.  Patient were to then identify at least two coping skills for each situation they identified and write it outside the web.  Education: Radiographer, therapeutic, Dentist.   Education Outcome: Acknowledges understanding/In group clarification offered/Needs additional education.   Clinical Observations/Feedback: Pt did not attend group.    Victorino Sparrow, LRT/CTRS         Victorino Sparrow A 12/31/2018 11:29 AM

## 2018-12-31 NOTE — Tx Team (Signed)
Interdisciplinary Treatment and Diagnostic Plan Update  12/31/2018 Time of Session: 0858 Nicholas Harrison MRN: [id number]  Principal Diagnosis: <principal problem not specified>  Secondary Diagnoses: Active Problems:   Psychoactive substance-induced psychosis ([facility])   Current Medications:  Current Facility-Administered Medications  Medication Dose Route Frequency Provider Last Rate Last Dose  . B-complex with vitamin C tablet 1 tablet  1 tablet Oral Daily Rankin, Shuvon B, NP   1 tablet at 12/31/18 0749  . busPIRone (BUSPAR) tablet 10 mg  10 mg Oral TID Antonieta Pert, MD   10 mg at 12/31/18 1139  . carbamazepine (TEGRETOL) chewable tablet 100 mg  100 mg Oral BID Malvin Johns, MD      . dicyclomine (BENTYL) tablet 20 mg  20 mg Oral Q6H PRN Antonieta Pert, MD      . doxepin [facility]) capsule 25 mg  25 mg Oral QHS PRN Antonieta Pert, MD      . gabapentin (NEURONTIN) capsule 300 mg  300 mg Oral TID Malvin Johns, MD   300 mg at 12/31/18 1154  . hydrOXYzine (ATARAX/VISTARIL) tablet 25 mg  25 mg Oral Q6H PRN Antonieta Pert, MD   25 mg at 12/31/18 0749  . loperamide (IMODIUM) capsule 2-4 mg  2-4 mg Oral PRN Antonieta Pert, MD   2 mg at 12/29/18 0854  . LORazepam (ATIVAN) tablet 1 mg  1 mg Oral Q6H PRN Antonieta Pert, MD      . methocarbamol (ROBAXIN) tablet 500 mg  500 mg Oral Q8H PRN Antonieta Pert, MD   500 mg at 12/29/18 0854  . naproxen (NAPROSYN) tablet 500 mg  500 mg Oral BID PRN Antonieta Pert, MD   500 mg at 12/31/18 0748  . ondansetron (ZOFRAN-ODT) disintegrating tablet 4 mg  4 mg Oral Q6H PRN Antonieta Pert, MD      . perphenazine (TRILAFON) tablet 2 mg  2 mg Oral QHS Antonieta Pert, MD   2 mg at 12/30/18 2044   PTA Medications: Medications Prior to Admission  Medication Sig Dispense Refill Last Dose  . b complex vitamins tablet Take 1 tablet by mouth daily. 100 tablet 3   . busPIRone (BUSPAR) 10 MG tablet Take 1 tablet (10 mg total) by  mouth 2 (two) times daily. Start with 5 mg bid x 1 week, then 10 mg bid (Patient taking differently: Take 5-10 mg by mouth See admin instructions. Start with 5 mg twice daily x 1 week starting on 12/27/2018 then 10 mg twice daily) 60 tablet 3   . cariprazine (VRAYLAR) capsule Take 1 capsule (1.5 mg total) by mouth daily. 30 capsule 3   . Cholecalciferol (VITAMIN D3) 50 MCG (2000 UT) capsule Take 1 capsule (2,000 Units total) by mouth daily. 100 capsule 3   . cloNIDine (CATAPRES) 0.1 MG tablet Take 1 tablet (0.1 mg total) by mouth 3 (three) times daily as needed (withdrawal symptoms). 60 tablet 1   . Potassium 99 MG TABS Take 1 tablet by mouth daily.       Patient Stressors:    Patient Strengths:    Treatment Modalities: Medication Management, Group therapy, Case management,  1 to 1 session with clinician, Psychoeducation, Recreational therapy.   Physician Treatment Plan for Primary Diagnosis: <principal problem not specified> Long Term Goal(s): Improvement in symptoms so as ready for discharge Improvement in symptoms so as ready for discharge   Short Term Goals: Ability to identify changes in lifestyle to reduce recurrence  of condition will improve Ability to verbalize feelings will improve Ability to disclose and discuss suicidal ideas Ability to demonstrate self-control will improve Ability to identify and develop effective coping behaviors will improve Ability to maintain clinical measurements within normal limits will improve Compliance with prescribed medications will improve Ability to identify triggers associated with substance abuse/mental health issues will improve Ability to identify changes in lifestyle to reduce recurrence of condition will improve Ability to verbalize feelings will improve Ability to disclose and discuss suicidal ideas Ability to demonstrate self-control will improve Ability to identify and develop effective coping behaviors will improve Ability to  maintain clinical measurements within normal limits will improve Compliance with prescribed medications will improve Ability to identify triggers associated with substance abuse/mental health issues will improve  Medication Management: Evaluate patient's response, side effects, and tolerance of medication regimen.  Therapeutic Interventions: 1 to 1 sessions, Unit Group sessions and Medication administration.  Evaluation of Outcomes: Progressing  Physician Treatment Plan for Secondary Diagnosis: Active Problems:   Psychoactive substance-induced psychosis ([place])  Long Term Goal(s): Improvement in symptoms so as ready for discharge Improvement in symptoms so as ready for discharge   Short Term Goals: Ability to identify changes in lifestyle to reduce recurrence of condition will improve Ability to verbalize feelings will improve Ability to disclose and discuss suicidal ideas Ability to demonstrate self-control will improve Ability to identify and develop effective coping behaviors will improve Ability to maintain clinical measurements within normal limits will improve Compliance with prescribed medications will improve Ability to identify triggers associated with substance abuse/mental health issues will improve Ability to identify changes in lifestyle to reduce recurrence of condition will improve Ability to verbalize feelings will improve Ability to disclose and discuss suicidal ideas Ability to demonstrate self-control will improve Ability to identify and develop effective coping behaviors will improve Ability to maintain clinical measurements within normal limits will improve Compliance with prescribed medications will improve Ability to identify triggers associated with substance abuse/mental health issues will improve     Medication Management: Evaluate patient's response, side effects, and tolerance of medication regimen.  Therapeutic Interventions: 1 to 1 sessions, Unit Group  sessions and Medication administration.  Evaluation of Outcomes: Progressing   RN Treatment Plan for Primary Diagnosis: <principal problem not specified> Long Term Goal(s): Knowledge of disease and therapeutic regimen to maintain health will improve  Short Term Goals: Ability to identify and develop effective coping behaviors will improve and Compliance with prescribed medications will improve  Medication Management: RN will administer medications as ordered by provider, will assess and evaluate patient's response and provide education to patient for prescribed medication. RN will report any adverse and/or side effects to prescribing provider.  Therapeutic Interventions: 1 on 1 counseling sessions, Psychoeducation, Medication administration, Evaluate responses to treatment, Monitor vital signs and CBGs as ordered, Perform/monitor CIWA, COWS, AIMS and Fall Risk screenings as ordered, Perform wound care treatments as ordered.  Evaluation of Outcomes: Progressing   LCSW Treatment Plan for Primary Diagnosis: <principal problem not specified> Long Term Goal(s): Safe transition to appropriate next level of care at discharge, Engage patient in therapeutic group addressing interpersonal concerns.  Short Term Goals: Engage patient in aftercare planning with referrals and resources, Increase social support and Increase skills for wellness and recovery  Therapeutic Interventions: Assess for all discharge needs, 1 to 1 time with Social worker, Explore available resources and support systems, Assess for adequacy in community support network, Educate family and significant other(s) on suicide prevention, Complete Psychosocial Assessment, Interpersonal  group therapy.  Evaluation of Outcomes: Progressing   Progress in Treatment: Attending groups: Yes. Participating in groups: Yes. Taking medication as prescribed: Yes. Toleration medication: Yes. Family/Significant other contact made: No, will  contact:  mother or father Patient understands diagnosis: Yes. Discussing patient identified problems/goals with staff: Yes. Medical problems stabilized or resolved: Yes. Denies suicidal/homicidal ideation: Yes. Issues/concerns per patient self-inventory: No. Other: none  New problem(s) identified: No, Describe:  none  New Short Term/Long Term Goal(s):  Patient Goals:    Discharge Plan or Barriers:   Reason for Continuation of Hospitalization: Delusions  Hallucinations Medication stabilization  Estimated Length of Stay: 3-5 days.  Attendees: Patient:  12/31/2018   Physician: Dr. Malvin JohnsBrian Farah, MD 12/31/2018   Nursing: Dossie ArbourAnthony Adams, RN 12/31/2018   RN Care Manager:   Social Worker: Daleen SquibbGreg Rhesa Forsberg, LCSW  12/31/2018   Recreational Therapist:    Other:     Other:    Other:        Scribe for Treatment Team: Lorri FrederickWierda, [place] Kearse Jon, LCSW 12/31/2018 1:43 PM

## 2018-12-31 NOTE — Progress Notes (Signed)
[facility] MD Progress Note  12/31/2018 11:13 AM Nicholas Harrison  MRN:  [id number] Subjective:   Patient alert coherent and denies current withdrawal symptoms focused on obtaining rehab.  Acknowledges that in the past his difficulty was following through after discharge from rehab facility.  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 Anxiety   . Depression   . Drug-induced seizure (HCC)   . Seizures (HCC)     Past Surgical History:  Procedure Laterality Date  . HAND SURGERY     Family History:  Family History  Problem Relation Age of Onset  . Depression Mother        anxiety  . Diabetes Maternal Grandfather    Family Psychiatric  History: No new data Social History:  Social History   Substance and Sexual Activity  Alcohol Use Yes   Comment: occasionally      Social History   Substance and Sexual Activity  Drug Use Yes  . Types: Benzodiazepines, Oxycodone   Comment: pt reports he takes Oxycodone and Xanax, last used 2 weeks ago as of 07/18/18    Social History   Socioeconomic History  . Marital status: Single    Spouse name: Not on file  . Number of children: Not on file  . Years of education: Not on file  . Highest education level: Not on file  Occupational History  . Not on file  Social Needs  . Financial resource strain: Not on file  . Food insecurity    Worry: Not on file    Inability: Not on file  . Transportation needs    Medical: Not on file    Non-medical: Not on file  Tobacco Use  .  Smoking status: Current Some Day Smoker  . Smokeless tobacco: Former Neurosurgeon    Types: Chew  . Tobacco comment: only experimented  Substance and Sexual Activity  . Alcohol use: Yes    Comment: occasionally   . Drug use: Yes    Types: Benzodiazepines, Oxycodone    Comment: pt reports he takes Oxycodone and Xanax, last used 2 weeks ago as of 07/18/18  . Sexual activity: Not Currently  Lifestyle  . Physical activity    Days per week: Not on file    Minutes per session: Not on file  . Stress: Not on file  Relationships  . Social Musician on phone: Not on file    Gets together: Not on file    Attends religious service: Not on file    Active member of club or organization: Not on file    Attends meetings of clubs or organizations: Not on file    Relationship status: Not on file  Other Topics Concern  . Not on file  Social History Narrative  . Not on file   Additional Social History:                         Sleep: Fair  Appetite:  Fair  Current Medications: Current Facility-Administered Medications  Medication Dose Route Frequency Provider Last Rate Last Dose  .  B-complex with vitamin C tablet 1 tablet  1 tablet Oral Daily Rankin, Shuvon B, NP   1 tablet at 12/31/18 0749  . busPIRone (BUSPAR) tablet 10 mg  10 mg Oral TID Sharma Covert, MD   10 mg at 12/31/18 0749  . dicyclomine (BENTYL) tablet 20 mg  20 mg Oral Q6H PRN Sharma Covert, MD      . doxepin [facility]) capsule 25 mg  25 mg Oral QHS PRN Sharma Covert, MD      . hydrOXYzine (ATARAX/VISTARIL) tablet 25 mg  25 mg Oral Q6H PRN Sharma Covert, MD   25 mg at 12/31/18 0749  . loperamide (IMODIUM) capsule 2-4 mg  2-4 mg Oral PRN Sharma Covert, MD   2 mg at 12/29/18 0854  . LORazepam (ATIVAN) tablet 1 mg  1 mg Oral Q6H PRN Sharma Covert, MD      . methocarbamol (ROBAXIN) tablet 500 mg  500 mg Oral Q8H PRN Sharma Covert, MD   500 mg at 12/29/18 0854  . naproxen (NAPROSYN)  tablet 500 mg  500 mg Oral BID PRN Sharma Covert, MD   500 mg at 12/31/18 0748  . ondansetron (ZOFRAN-ODT) disintegrating tablet 4 mg  4 mg Oral Q6H PRN Sharma Covert, MD      . perphenazine (TRILAFON) tablet 2 mg  2 mg Oral QHS Sharma Covert, MD   2 mg at 12/30/18 2044    Lab Results: No results found for this or any previous visit (from the past 48 hour(s)).  Blood Alcohol level:  Lab Results  Component Value Date   ETH <10 12/27/2018   ETH <10 17/40/8144    Metabolic Disorder Labs: Lab Results  Component Value Date   HGBA1C 4.7 (L) 11/09/2017   MPG 88.19 11/09/2017   No results found for: PROLACTIN Lab Results  Component Value Date   CHOL 152 11/09/2017   TRIG 79 11/09/2017   HDL 36 (L) 11/09/2017   CHOLHDL 4.2 11/09/2017   VLDL 16 11/09/2017   LDLCALC 100 (H) 11/09/2017     Musculoskeletal: Strength & Muscle Tone: within normal limits Gait & Station: normal Patient leans: N/A  Psychiatric Specialty Exam: Physical Exam  ROS  Blood pressure 121/90, pulse (!) 117, temperature 98.9 F (37.2 C), temperature source Oral, resp. rate 16, height 5\' 10"  (1.778 m), weight 75.8 kg, SpO2 100 %.Body mass index is 23.96 kg/m.  General Appearance: Casual  Eye Contact:  Fair  Speech:  Clear and Coherent  Volume:  Normal  Mood:  Euthymic  Affect:  Congruent  Thought Process:  Coherent and Goal Directed  Orientation:  Full (Time, Place, and Person)  Thought Content:  Logical  Suicidal Thoughts:  No  Homicidal Thoughts:  No  Memory:  Immediate;   Good Recent;   Good Remote;   Good  Judgement:  Good  Insight:  Good  Psychomotor Activity:  Normal  Concentration:  Concentration: Fair and Attention Span: Fair  Recall:  [employer] of Knowledge:  Fair  Language:  Fair  Akathisia:  Negative  Handed:  Right  AIMS (if indicated):     Assets:  Leisure Time Physical Health Social Support Talents/Skills  ADL's:  Intact  Cognition:  WNL  Sleep:  Number of  Hours: 6.5    Treatment Plan Summary: Daily contact with patient to assess and evaluate symptoms and progress in treatment and Medication management  Seek out rehab options through social work but  we will also add low-dose carbamazepine/gabapentin to protect from protracted Xanax withdrawal/seizure/psychosis  Malvin JohnsFARAH,Rhyland Hinderliter, MD 12/31/2018, 11:13 AM

## 2018-12-31 NOTE — Progress Notes (Signed)
Adult Psychoeducational Group Note  Date:  12/31/2018 Time:  11:24 PM  Group Topic/Focus:  Wrap-Up Group:   The focus of this group is to help patients review their daily goal of treatment and discuss progress on daily workbooks.  Participation Level:  Active  Participation Quality:  Appropriate  Affect:  Appropriate  Cognitive:  Appropriate  Insight: Appropriate  Engagement in Group:  Developing/Improving  Modes of Intervention:  Discussion  Additional Comments:  Pt stated his goal for today was to focus on his treatment plan. Pt stated he felt he accomplished his goal today. Pt stated his relationship with his family has improved since he was admitted here. Pt stated been able to contact his family today help improve his day. Pt stated he felt better about himself today. Pt rated his overall day a 10. Pt stated his appetite was pretty good today. Pt stated his goal for tonight was to get some rest. Pt did not complain of pain tonight. Pt stated he was not hearing or seeing anything that was not there.  Pt stated he had no thoughts of harming himself or others. Pt stated he would alert staff if anything changes.   Candy Sledge 12/31/2018, 11:24 PM

## 2018-12-31 NOTE — Progress Notes (Signed)
Patient ID: Nicholas Harrison, male   DOB: 11/23/1990, 28 y.o.   MRN: [id number]  [place] NOVEL CORONAVIRUS (COVID-19) DAILY CHECK-OFF SYMPTOMS - answer yes or no to each - every day NO YES  Have you had a fever in the past 24 hours?  . Fever (Temp > 37.80C / 100F) X   Have you had any of these symptoms in the past 24 hours? . New Cough .  Sore Throat  .  Shortness of Breath .  Difficulty Breathing .  Unexplained Body Aches   X   Have you had any one of these symptoms in the past 24 hours not related to allergies?   . Runny Nose .  Nasal Congestion .  Sneezing   X   If you have had runny nose, nasal congestion, sneezing in the past 24 hours, has it worsened?  X   EXPOSURES - check yes or no X   Have you traveled outside the state in the past 14 days?  X   Have you been in contact with someone with a confirmed diagnosis of COVID-19 or PUI in the past 14 days without wearing appropriate PPE?  X   Have you been living in the same home as a person with confirmed diagnosis of COVID-19 or a PUI (household contact)?    X   Have you been diagnosed with COVID-19?    X              What to do next: Answered NO to all: Answered YES to anything:   Proceed with unit schedule Follow the [facility] Inpatient Flowsheet.   

## 2018-12-31 NOTE — Progress Notes (Signed)
D: Pt denies SI/HI/AVH. Pt is pleasant and cooperative. Pt visible on the milieu A: Pt was offered support and encouragement. Pt was given scheduled medications. Pt was encourage to attend groups. Q 15 minute checks were done for safety.  R: safety maintained on unit.

## 2019-01-01 NOTE — Progress Notes (Signed)
D: Pt denies SI/HI/AVH. Pt is pleasant and cooperative. Pt visible on the milieu interacting with peers, pt had confrontation with a peer, but stated he felt better.  A: Pt was offered support and encouragement. Pt was given scheduled medications. Pt was encourage to attend groups. Q 15 minute checks were done for safety.  R:Pt attends groups and interacts well with peers and staff. Pt is taking medication. Pt has no complaints.Pt receptive to treatment and safety maintained on unit.

## 2019-01-01 NOTE — Progress Notes (Signed)
Recreation Therapy Notes  Date: 10.13.20 Time: 1000 Location: 400 Hall Dayroom  Group Topic: Self-Esteem  Goal Area(s) Addresses:  Patient will successfully identify positive attributes about themselves.  Patient will successfully identify benefit of improved self-esteem.   Intervention: [employer], face outlines, music  Activity: How I See Me.  Patients were given a blank outline of a face. Using words and drawing, patients were to express how they see themselves and identify all positive characteristics.  Education:  Self-Esteem, Dentist.   Education Outcome: Acknowledges education/In group clarification offered/Needs additional education  Clinical Observations/Feedback: Pt did not attend group session.    Victorino Sparrow, LRT/CTRS         Ria Comment, Melvyn Hommes A 01/01/2019 11:59 AM

## 2019-01-01 NOTE — Progress Notes (Signed)
[facility] MD Progress Note  01/01/2019 8:55 AM Nicholas Harrison  MRN:  [id number] Subjective:    Patient alert oriented and cooperative no cravings tremors or withdrawal he confirms it is been over 6 weeks since he had a benzodiazepine and he does not believe he is at risk for seizures at the present time so we can discontinue the carbamazepine but we will go ahead and continue the gabapentin in order to reduce cravings off label  No thoughts of harming self or others no involuntary movements Principal Problem: Polysubstance dependency Diagnosis: Active Problems:   Psychoactive substance-induced psychosis ([place])  Total Time spent with patient: 20 minutes  Past Psychiatric History: see eval  Past Medical History:  Past Medical History:  Diagnosis Date  . Anxiety   . Depression   . Drug-induced seizure ([place])   . Seizures ([place])     Past Surgical History:  Procedure Laterality Date  . HAND SURGERY     Family History:  Family History  Problem Relation Age of Onset  . Depression Mother        anxiety  . Diabetes Maternal Grandfather    Family Psychiatric  History: see eval Social History:  Social History   Substance and Sexual Activity  Alcohol Use Yes   Comment: occasionally      Social History   Substance and Sexual Activity  Drug Use Yes  . Types: Benzodiazepines, Oxycodone   Comment: pt reports he takes Oxycodone and Xanax, last used 2 weeks ago as of 07/18/18    Social History   Socioeconomic History  . Marital status: Single    Spouse name: Not on file  . Number of children: Not on file  . Years of education: Not on file  . Highest education level: Not on file  Occupational History  . Not on file  Social Needs  . Financial resource strain: Not on file  . Food insecurity    Worry: Not on file    Inability: Not on file  . Transportation needs    Medical: Not on file    Non-medical: Not on file  Tobacco Use  . Smoking status: Current Some Day Smoker  .  Smokeless tobacco: Former Systems developer    Types: Chew  . Tobacco comment: only experimented  Substance and Sexual Activity  . Alcohol use: Yes    Comment: occasionally   . Drug use: Yes    Types: Benzodiazepines, Oxycodone    Comment: pt reports he takes Oxycodone and Xanax, last used 2 weeks ago as of 07/18/18  . Sexual activity: Not Currently  Lifestyle  . Physical activity    Days per week: Not on file    Minutes per session: Not on file  . Stress: Not on file  Relationships  . Social Herbalist on phone: Not on file    Gets together: Not on file    Attends religious service: Not on file    Active member of club or organization: Not on file    Attends meetings of clubs or organizations: Not on file    Relationship status: Not on file  Other Topics Concern  . Not on file  Social History Narrative  . Not on file   Additional Social History:                         Sleep: Good  Appetite:  Good  Current Medications: Current Facility-Administered Medications  Medication Dose Route  Frequency Provider Last Rate Last Dose  . B-complex with vitamin C tablet 1 tablet  1 tablet Oral Daily Rankin, Shuvon B, NP   1 tablet at 01/01/19 0824  . busPIRone (BUSPAR) tablet 10 mg  10 mg Oral TID Antonieta Pertlary, Greg Lawson, MD   10 mg at 01/01/19 0824  . dicyclomine (BENTYL) tablet 20 mg  20 mg Oral Q6H PRN Antonieta Pertlary, Greg Lawson, MD      . doxepin [facility](SINEQUAN) capsule 25 mg  25 mg Oral QHS PRN Antonieta Pertlary, Greg Lawson, MD   25 mg at 12/31/18 2055  . gabapentin (NEURONTIN) capsule 300 mg  300 mg Oral TID Malvin JohnsFarah, Dyna Figuereo, MD   300 mg at 01/01/19 0824  . hydrOXYzine (ATARAX/VISTARIL) tablet 25 mg  25 mg Oral Q6H PRN Antonieta Pertlary, Greg Lawson, MD   25 mg at 12/31/18 2359  . loperamide (IMODIUM) capsule 2-4 mg  2-4 mg Oral PRN Antonieta Pertlary, Greg Lawson, MD   2 mg at 12/29/18 0854  . LORazepam (ATIVAN) tablet 1 mg  1 mg Oral Q6H PRN Antonieta Pertlary, Greg Lawson, MD      . methocarbamol (ROBAXIN) tablet 500 mg  500 mg Oral Q8H  PRN Antonieta Pertlary, Greg Lawson, MD   500 mg at 12/29/18 0854  . naproxen (NAPROSYN) tablet 500 mg  500 mg Oral BID PRN Antonieta Pertlary, Greg Lawson, MD   500 mg at 12/31/18 0748  . ondansetron (ZOFRAN-ODT) disintegrating tablet 4 mg  4 mg Oral Q6H PRN Antonieta Pertlary, Greg Lawson, MD      . perphenazine (TRILAFON) tablet 2 mg  2 mg Oral QHS Antonieta Pertlary, Greg Lawson, MD   2 mg at 12/31/18 2054    Lab Results: No results found for this or any previous visit (from the past 48 hour(s)).  Blood Alcohol level:  Lab Results  Component Value Date   ETH <10 12/27/2018   ETH <10 07/18/2018    Metabolic Disorder Labs: Lab Results  Component Value Date   HGBA1C 4.7 (L) 11/09/2017   MPG 88.19 11/09/2017   No results found for: PROLACTIN Lab Results  Component Value Date   CHOL 152 11/09/2017   TRIG 79 11/09/2017   HDL 36 (L) 11/09/2017   CHOLHDL 4.2 11/09/2017   VLDL 16 11/09/2017   LDLCALC 100 (H) 11/09/2017    Musculoskeletal: Strength & Muscle Tone: within normal limits Gait & Station: normal Patient leans: N/A  Psychiatric Specialty Exam: Physical Exam  ROS  Blood pressure 115/89, pulse 78, temperature 98.1 F (36.7 C), temperature source Oral, resp. rate 18, height 5\' 10"  (1.778 m), weight 75.8 kg, SpO2 100 %.Body mass index is 23.96 kg/m.  General Appearance: Casual  Eye Contact:  Fair  Speech:  Clear and Coherent  Volume:  Normal  Mood:  Euthymic  Affect:  Appropriate  Thought Process:  Coherent  Orientation:  Full (Time, Place, and Person)  Thought Content:  Logical and Tangential  Suicidal Thoughts:  No  Homicidal Thoughts:  No  Memory:  Immediate;   Fair Recent;   Good Remote;   Good  Judgement:  Good  Insight:  Good  Psychomotor Activity:  Normal  Concentration:  Concentration: Good and Attention Span: Good  Recall:  Good  Fund of Knowledge:  Good  Language:  Good  Akathisia:  Negative  Handed:  Right  AIMS (if indicated):     Assets:  Communication Skills Desire for Improvement   ADL's:  Intact  Cognition:  WNL  Sleep:  Number of Hours: 5.5  Treatment Plan Summary: Daily contact with patient to assess and evaluate symptoms and progress in treatment and Medication management  Discontinue carbamazepine continue gabapentin continue to seek rehab options probable discharge tomorrow no change in precautions  Nicholas Millan, MD 01/01/2019, 8:55 AM

## 2019-01-01 NOTE — Progress Notes (Signed)
Recreation Therapy Notes  INPATIENT RECREATION THERAPY ASSESSMENT  Patient Details Name: Nicholas Harrison MRN: [id number] DOB: 31-Jan-1991 Today's Date: 01/01/2019       Information Obtained From: Patient  Able to Participate in Assessment/Interview: Yes  Patient Presentation: Alert  Reason for Admission (Per Patient): Substance Abuse  Patient Stressors: Family(Pt stated parents are going through a messy separation/divorce)  Coping Skills:   Isolation, TV, Sports, Arguments, Music, Exercise, Deep Breathing, Substance Abuse, Impulsivity, Talk, Prayer, Hot Bath/Shower, Avoidance  Leisure Interests (2+):  Exercise - Journalist, newspaper, Sports - Golf, Individual - Other (Comment)(Car sales)  Frequency of Recreation/Participation: Other (Comment)(Daily)  Awareness of Community Resources:  Yes  Community Resources:  [employer], Engineer, building services, Patent examiner, Engineer, drilling  Current Use: (Not as much)  If no, Barriers?: Other (Comment)(COVID)  Expressed Interest in [place]: No  [place] of Residence:  Investment banker, corporate  Patient Main Form of Transportation: Musician  Patient Strengths:  Communication; Has a 2 year degree  Patient Identified Areas of Improvement:  Drug use  Patient Goal for Hospitalization:  "get clarity"  Current SI (including self-harm):  No  Current HI:  No  Current AVH: No  Staff Intervention Plan: Group Attendance, Collaborate with Interdisciplinary Treatment Team  Consent to Intern Participation: N/A    Victorino Sparrow, LRT/CTRS  Ria Comment, Graves Nipp A 01/01/2019, 12:06 PM

## 2019-01-02 MED ORDER — GABAPENTIN 300 MG PO CAPS: 300.0000 mg | ORAL_CAPSULE | Freq: Three times a day (TID) | ORAL | 2 refills | Status: DC

## 2019-01-02 MED ORDER — DOXEPIN HCL 25 MG PO CAPS: 25.0000 mg | ORAL_CAPSULE | Freq: Every evening | ORAL | 1 refills | Status: DC | PRN

## 2019-01-02 NOTE — Progress Notes (Signed)
Recreation Therapy Notes  Date: 10.14.20 Time: 1000 Location: 500 Hall Dayroom  Group Topic: Wellness  Goal Area(s) Addresses:  Patient will define components of whole wellness. Patient will verbalize benefit of whole wellness.  Behavioral Response:  None  Intervention: Music     Activity:  Exercise. LRT and patients completed a workout routine created by the patients.  The routine consisted of squats, push ups, leg raises, side crunches, v-ups and planks.  Education: Wellness, Dentist.   Education Outcome: Acknowledges education/In group clarification offered/Needs additional education.   Clinical Observations/Feedback:  Pt sat in group for a few minutes.  Pt was encouraged by LRT and peers to participate but left and did not return.    Victorino Sparrow, LRT/CTRS    Ria Comment, Vernal Rutan A 01/02/2019 10:59 AM

## 2019-01-02 NOTE — Discharge Summary (Signed)
Physician Discharge Summary Note  Patient:  Nicholas Harrison is an 28 y.o., male MRN:  [id number] DOB:  Jan 09, 1991 Patient phone:  [phone] (home)  Patient address:   [place] [address],  Total Time spent with patient: 15 minutes  Date of Admission:  12/28/2018 Date of Discharge: 01/02/19  Reason for Admission:  Substance-induced psychosis  Principal Problem: <principal problem not specified> Discharge Diagnoses: Active Problems:   Psychoactive substance-induced psychosis ([place])   Past Psychiatric History: Patient has a multiyear history of polysubstance dependence.  He has been treated for opiate dependence.  He has been on Suboxone as well as subs all before.  He also reports smoking marijuana when he first went to college.  He reports recently snorting oxycodone.  He also admitted to benzodiazepine dependence and getting Xanax off the street.  Past Medical History:  Past Medical History:  Diagnosis Date  . Anxiety   . Depression   . Drug-induced seizure ([place])   . Seizures ([place])     Past Surgical History:  Procedure Laterality Date  . HAND SURGERY     Family History:  Family History  Problem Relation Age of Onset  . Depression Mother        anxiety  . Diabetes Maternal Grandfather    Family Psychiatric  History: Father with history of alcohol abuse, mother with history of depression.  Social History:  Social History   Substance and Sexual Activity  Alcohol Use Yes   Comment: occasionally      Social History   Substance and Sexual Activity  Drug Use Yes  . Types: Benzodiazepines, Oxycodone   Comment: pt reports he takes Oxycodone and Xanax, last used 2 weeks ago as of 07/18/18    Social History   Socioeconomic History  . Marital status: Single    Spouse name: Not on file  . Number of children: Not on file  . Years of education: Not on file  . Highest education level: Not on file  Occupational History  . Not on file  Social  Needs  . Financial resource strain: Not on file  . Food insecurity    Worry: Not on file    Inability: Not on file  . Transportation needs    Medical: Not on file    Non-medical: Not on file  Tobacco Use  . Smoking status: Current Some Day Smoker  . Smokeless tobacco: Former Systems developer    Types: Chew  . Tobacco comment: only experimented  Substance and Sexual Activity  . Alcohol use: Yes    Comment: occasionally   . Drug use: Yes    Types: Benzodiazepines, Oxycodone    Comment: pt reports he takes Oxycodone and Xanax, last used 2 weeks ago as of 07/18/18  . Sexual activity: Not Currently  Lifestyle  . Physical activity    Days per week: Not on file    Minutes per session: Not on file  . Stress: Not on file  Relationships  . Social Herbalist on phone: Not on file    Gets together: Not on file    Attends religious service: Not on file    Active member of club or organization: Not on file    Attends meetings of clubs or organizations: Not on file    Relationship status: Not on file  Other Topics Concern  . Not on file  Social History Narrative  . Not on file    Hospital Course:  From admission assessment:  Nicholas Richer [place] an 28 y.o.[facility]ingat [facility] for [facility] and delusional thoughts about COVID 19 as well as paranoia. Pt was accompanied by both parents but was later IVC'ed at the ED by Dr. Eulis Foster MD, per IVC paperwork, " Pt thinks that his parents are behind the [place] and withholding secrets from him. He also thinks secrets are on his phone. He threatened to shoot himself with gun." During TTS assessment  pt expressed, " I was talking out of the normal, I think I got confused. I started trying to figure things out  With COVID". Pt denied SI, HI and AVH. Pt denied any past SI and no self injurious behaviors. PT denied any past traumas or abuse. Pt reported symptoms of depression such as isolation, anxiety and feeling worthless. Pt admitted  to having access to weapons, he has guns at his parents home. Pt denied any current drug use but marijuana was found via his lab results. Pt admitted to past use of benzos, opiates, and xanax. Pt recently met with provider and was prescribed clonidine.Pt also taking Buspar as recommended by his PCP. Pt received inpatient treatment September 2019 for depression and substance use. Pt reported normal sleep and appetite. Pt reported he is employed and stays with parents and has no upcoming criminal charges or court dates. Collateral: TTS counselor contacted pt's mother Tammy. Pt's mother expressed that her son had starting displaying unusual behavior such as laughing uncontrollably and talking about COVID 57 and accusing parents of being involved in [place].  Pts mother expressed pt having hallucinations before starting new medicine clonidine yesterday. Pt's mothert said that his delusions started yesterday after taking clonodine. Pts mother also stated that he was getting violent and agitated in the ED and threatened to kill himself with gun.  From admission H&P: Patient is a 28 year old male with a past psychiatric history significant for opiate use disorder, benzodiazepine use disorder, reported history of depression and anxiety. The patient presented to the [facility] emergency department on 12/27/2018. The patient presented with his mother and father. He presented because his behavior was "different from earlier today". It is difficult to make out from the notes in the chart but the patient apparently had hallucinations and some form of delusional thoughts. Patient stated he had been given clonidine (it says in the chart Clonopin, but was clonidine). He has a long history of opiate dependence, and I believe 1 of his physicians gave him this to assist with withdrawal. He had some untoward reaction after taking the clonidine. He also has a history of benzodiazepine dependence, and has been getting those off  the street. During the interview today he denied any auditory or visual hallucinations, and stated that after he did receive the clonidine that he was "out of it and talking out of my head". He had a drug screen done on 10/1 as well as 10/8. It was positive for marijuana and oxycodone. There were no benzodiazepines present. The patient also stated that in the past he had had a seizure that was most likely related to benzodiazepine withdrawal. His last legal prescription for Xanax was in 2019. He stated he had been accessing them off the street. He also stated that he was snorting oxycodone. He had previously been treated with buprenorphine in the form of Suboxone as well as subs all in the past. The patient's last admission to our facility was in August 2019. His diagnosis at that time was substance use disorder as well as major depression.  He was discharged on clonidine, Lexapro and Seroquel. He stated he took the Seroquel for sleep, but it made him feel lethargic. He received a prescription from his neurologist for Vraylar, but did not receive that until he got admitted to the hospital. Review of the emergency room notes over the past also showed that the patient had received fluoxetine 20 mg p.o. daily as well as Seroquel and clonidine in the past. It is unclear why the clonidine would have led to these untoward reactions, but I am sure the benzodiazepines and opiates as well as marijuana could have easily led to this.   Mr. Pham was admitted for substance-induced psychosis. He remained on the [facility] unit for five days. CIWA protocol was started with Ativan PRN CIWA>10 for BZD withdrawal. COWS protocol was started for opioid withdrawal. Doxepin was started for insomnia. Neurontin was started to protect from protracted Xanax withdrawal. He participated in group therapy on the unit. He responded well to treatment. He has shown more organized thoughts with no paranoid or bizarre behaviors. No  delusional thought content expressed. No signs of responding to internal stimuli. He has been calm and cooperative on the unit with no apparent signs of psychosis at this time. Patient expresses motivation to abstain from substance use completely and plans to follow up with AA/NA sponsor as well as [facility] (see below). He denies any SI/HI/AVH and contracts for safety. He is discharging on the medications below. He is provided with prescriptions for medications upon discharge. His mother is picking him up for discharge home.  Physical Findings: AIMS:  , ,  ,  ,    CIWA:    COWS:  COWS Total Score: 1  Musculoskeletal: Strength & Muscle Tone: within normal limits Gait & Station: normal Patient leans: N/A  Psychiatric Specialty Exam: Physical Exam  Nursing note and vitals reviewed. Constitutional: He is oriented to person, place, and time. He appears well-developed and well-nourished.  Cardiovascular: Normal rate.  Respiratory: Effort normal.  Neurological: He is alert and oriented to person, place, and time.    Review of Systems  Constitutional: Negative.   Respiratory: Negative for cough and shortness of breath.   Cardiovascular: Negative for chest pain.  Gastrointestinal: Negative for nausea and vomiting.  Neurological: Negative for tremors, sensory change and headaches.  Psychiatric/Behavioral: Positive for substance abuse. Negative for depression, hallucinations and suicidal ideas. The patient is not nervous/anxious and does not have insomnia.     Blood pressure 115/89, pulse 78, temperature 98.1 F (36.7 C), temperature source Oral, resp. rate 18, height _0  (1.778 m), weight 75.8 kg, SpO2 100 %.Body mass index is 23.96 kg/m.  See MD's discharge SRA      Has this patient used any form of tobacco in the last 30 days? (Cigarettes, Smokeless Tobacco, Cigars, and/or Pipes)  No  Blood Alcohol level:  Lab Results  Component Value Date   ETH <10 12/27/2018   ETH <10 07/37/1062     Metabolic Disorder Labs:  Lab Results  Component Value Date   HGBA1C 4.7 (L) 11/09/2017   MPG 88.19 11/09/2017   No results found for: PROLACTIN Lab Results  Component Value Date   CHOL 152 11/09/2017   TRIG 79 11/09/2017   HDL 36 (L) 11/09/2017   CHOLHDL 4.2 11/09/2017   VLDL 16 11/09/2017   LDLCALC 100 (H) 11/09/2017    See Psychiatric Specialty Exam and Suicide Risk Assessment completed by Attending Physician prior to discharge.  Discharge destination:  Home  Is  patient on multiple antipsychotic therapies at discharge:  No   Has Patient had three or more failed trials of antipsychotic monotherapy by history:  No  Recommended Plan for Multiple Antipsychotic Therapies: NA   Allergies as of 01/02/2019      Reactions   Buspar [buspirone] Nausea Only   Dizziness (also) The patient does not think it was problem.  He is willing to try it again   Seroquel [quetiapine Fumarate]    Sleepy, vivid dreams      Medication List    STOP taking these medications   cariprazine capsule Commonly known as: Vraylar   cloNIDine 0.1 MG tablet Commonly known as: CATAPRES   Potassium 99 MG Tabs     TAKE these medications     Indication  b complex vitamins tablet Take 1 tablet by mouth daily.  Indication: Vitamin Deficiency   busPIRone 10 MG tablet Commonly known as: BUSPAR Take 1 tablet (10 mg total) by mouth 2 (two) times daily. Start with 5 mg bid x 1 week, then 10 mg bid What changed:   how much to take  when to take this  additional instructions  Indication: Anxiety Disorder   doxepin 25 MG capsule Commonly known as: SINEQUAN Take 1 capsule (25 mg total) by mouth at bedtime as needed (insomnia).  Indication: Depression   gabapentin 300 MG capsule Commonly known as: NEURONTIN Take 1 capsule (300 mg total) by mouth 3 (three) times daily.  Indication: Abuse or Misuse of Alcohol   Vitamin D3 50 MCG (2000 UT) capsule Take 1 capsule (2,000 Units total) by  mouth daily.  Indication: Vitamin D Deficiency      Follow-up Information    [facility] Follow up on 01/04/2019.   Why: Hospital discharge appointment is Friday, 10/16 at 10:00a.  Please bring your photo ID, insurance card, proof of income and residency and current medications.  Contact information: [place] [address] [phone]           Follow-up recommendations: Activity as tolerated. Diet as recommended by primary care physician. Keep all scheduled follow-up appointments as recommended.   Comments:   Patient is instructed to take all prescribed medications as recommended. Report any side effects or adverse reactions to your outpatient psychiatrist. Patient is instructed to abstain from alcohol and illegal drugs while on prescription medications. In the event of worsening symptoms, patient is instructed to call the crisis hotline, 911, or go to the nearest emergency department for evaluation and treatment.  Signed: Connye Burkitt, NP 01/02/2019, 10:44 AM

## 2019-01-02 NOTE — Plan of Care (Signed)
Pt is being discharged and only attended one recreation therapy group session.    Victorino Sparrow, LRT/CTRS

## 2019-01-02 NOTE — [facility] Suicide Risk Assessment (Signed)
[facility] Discharge Suicide Risk Assessment   Principal Problem: Patient required admission to complete detox/adjust med/arrange for some type of meaningful rehab Discharge Diagnoses: Active Problems:   Psychoactive substance-induced psychosis ([place])   Total Time spent with patient: 45 minutes  Musculoskeletal: Strength & Muscle Tone: within normal limits Gait & Station: normal Patient leans: N/A  Psychiatric Specialty Exam: ROS  Blood pressure 115/89, pulse 78, temperature 98.1 F (36.7 C), temperature source Oral, resp. rate 18, height 5\' 10"  (1.778 m), weight 75.8 kg, SpO2 100 %.Body mass index is 23.96 kg/m.  General Appearance: Casual  Eye Contact::  Good  Speech:  Clear and Coherent409  Volume:  Normal  Mood:  Euthymic  Affect:  Full Range  Thought Process:  Coherent and Descriptions of Associations: Intact  Orientation:  Full (Time, Place, and Person)  Thought Content:  Logical  Suicidal Thoughts:  No  Homicidal Thoughts:  No  Memory:  Immediate;   Good Recent;   Good Remote;   Good  Judgement:  Good  Insight:  Good  Psychomotor Activity:  Normal  Concentration:  Good  Recall:  Good  Fund of Knowledge:Good  Language: Good  Akathisia:  Negative  Handed:  Right  AIMS (if indicated):     Assets:  Communication Skills Desire for Improvement  Sleep:  Number of Hours: 6.25  Cognition: WNL  ADL's:  Intact   Mental Status Per Nursing Assessment::   On Admission:  NA  Demographic Factors:  Male  Loss Factors: NA  Historical Factors: NA  Risk Reduction Factors:   Positive therapeutic relationship  Continued Clinical Symptoms:  Previous Psychiatric Diagnoses and Treatments  Cognitive Features That Contribute To Risk:  None    Suicide Risk:  Minimal: No identifiable suicidal ideation.  Patients presenting with no risk factors but with morbid ruminations; may be classified as minimal risk based on the severity of the depressive symptoms  Follow-up  Information    [facility] Follow up on 01/04/2019.   Why: Hospital discharge appointment is Friday, 10/16 at 10:00a.  Please bring your photo ID, insurance card, proof of income and residency and current medications.  Contact information: [place] [address] [phone]           Plan Of Care/Follow-up recommendations:  Activity:  full  Laria Grimmett, MD 01/02/2019, 10:15 AM

## 2019-01-02 NOTE — Tx Team (Signed)
Interdisciplinary Treatment and Diagnostic Plan Update  01/02/2019 Time of Session: 10:20am Nicholas Harrison MRN: [id number]  Principal Diagnosis: <principal problem not specified>  Secondary Diagnoses: Active Problems:   Psychoactive substance-induced psychosis ([place])   Current Medications:  Current Facility-Administered Medications  Medication Dose Route Frequency Provider Last Rate Last Dose  . B-complex with vitamin C tablet 1 tablet  1 tablet Oral Daily Rankin, Shuvon B, NP   1 tablet at 01/02/19 0753  . busPIRone (BUSPAR) tablet 10 mg  10 mg Oral TID Sharma Covert, MD   10 mg at 01/02/19 0753  . dicyclomine (BENTYL) tablet 20 mg  20 mg Oral Q6H PRN Sharma Covert, MD      . doxepin [facility]) capsule 25 mg  25 mg Oral QHS PRN Sharma Covert, MD   25 mg at 01/01/19 2056  . gabapentin (NEURONTIN) capsule 300 mg  300 mg Oral TID Johnn Hai, MD   300 mg at 01/02/19 0753  . hydrOXYzine (ATARAX/VISTARIL) tablet 25 mg  25 mg Oral Q6H PRN Sharma Covert, MD   25 mg at 01/01/19 2056  . loperamide (IMODIUM) capsule 2-4 mg  2-4 mg Oral PRN Sharma Covert, MD   2 mg at 12/29/18 0854  . LORazepam (ATIVAN) tablet 1 mg  1 mg Oral Q6H PRN Sharma Covert, MD   1 mg at 01/01/19 2216  . methocarbamol (ROBAXIN) tablet 500 mg  500 mg Oral Q8H PRN Sharma Covert, MD   500 mg at 12/29/18 0854  . naproxen (NAPROSYN) tablet 500 mg  500 mg Oral BID PRN Sharma Covert, MD   500 mg at 12/31/18 0748  . ondansetron (ZOFRAN-ODT) disintegrating tablet 4 mg  4 mg Oral Q6H PRN Sharma Covert, MD      . perphenazine (TRILAFON) tablet 2 mg  2 mg Oral QHS Sharma Covert, MD   2 mg at 01/01/19 2056   PTA Medications: Medications Prior to Admission  Medication Sig Dispense Refill Last Dose  . b complex vitamins tablet Take 1 tablet by mouth daily. 100 tablet 3   . busPIRone (BUSPAR) 10 MG tablet Take 1 tablet (10 mg total) by mouth 2 (two) times daily. Start with 5 mg bid x 1  week, then 10 mg bid (Patient taking differently: Take 5-10 mg by mouth See admin instructions. Start with 5 mg twice daily x 1 week starting on 12/27/2018 then 10 mg twice daily) 60 tablet 3   . cariprazine (VRAYLAR) capsule Take 1 capsule (1.5 mg total) by mouth daily. 30 capsule 3   . Cholecalciferol (VITAMIN D3) 50 MCG (2000 UT) capsule Take 1 capsule (2,000 Units total) by mouth daily. 100 capsule 3   . cloNIDine (CATAPRES) 0.1 MG tablet Take 1 tablet (0.1 mg total) by mouth 3 (three) times daily as needed (withdrawal symptoms). 60 tablet 1   . Potassium 99 MG TABS Take 1 tablet by mouth daily.       Patient Stressors:    Patient Strengths:    Treatment Modalities: Medication Management, Group therapy, Case management,  1 to 1 session with clinician, Psychoeducation, Recreational therapy.   Physician Treatment Plan for Primary Diagnosis: <principal problem not specified> Long Term Goal(s): Improvement in symptoms so as ready for discharge Improvement in symptoms so as ready for discharge   Short Term Goals: Ability to identify changes in lifestyle to reduce recurrence of condition will improve Ability to verbalize feelings will improve Ability to disclose and  discuss suicidal ideas Ability to demonstrate self-control will improve Ability to identify and develop effective coping behaviors will improve Ability to maintain clinical measurements within normal limits will improve Compliance with prescribed medications will improve Ability to identify triggers associated with substance abuse/mental health issues will improve Ability to identify changes in lifestyle to reduce recurrence of condition will improve Ability to verbalize feelings will improve Ability to disclose and discuss suicidal ideas Ability to demonstrate self-control will improve Ability to identify and develop effective coping behaviors will improve Ability to maintain clinical measurements within normal limits will  improve Compliance with prescribed medications will improve Ability to identify triggers associated with substance abuse/mental health issues will improve  Medication Management: Evaluate patient's response, side effects, and tolerance of medication regimen.  Therapeutic Interventions: 1 to 1 sessions, Unit Group sessions and Medication administration.  Evaluation of Outcomes: Adequate for Discharge  Physician Treatment Plan for Secondary Diagnosis: Active Problems:   Psychoactive substance-induced psychosis ([facility])  Long Term Goal(s): Improvement in symptoms so as ready for discharge Improvement in symptoms so as ready for discharge   Short Term Goals: Ability to identify changes in lifestyle to reduce recurrence of condition will improve Ability to verbalize feelings will improve Ability to disclose and discuss suicidal ideas Ability to demonstrate self-control will improve Ability to identify and develop effective coping behaviors will improve Ability to maintain clinical measurements within normal limits will improve Compliance with prescribed medications will improve Ability to identify triggers associated with substance abuse/mental health issues will improve Ability to identify changes in lifestyle to reduce recurrence of condition will improve Ability to verbalize feelings will improve Ability to disclose and discuss suicidal ideas Ability to demonstrate self-control will improve Ability to identify and develop effective coping behaviors will improve Ability to maintain clinical measurements within normal limits will improve Compliance with prescribed medications will improve Ability to identify triggers associated with substance abuse/mental health issues will improve     Medication Management: Evaluate patient's response, side effects, and tolerance of medication regimen.  Therapeutic Interventions: 1 to 1 sessions, Unit Group sessions and Medication  administration.  Evaluation of Outcomes: Adequate for Discharge   RN Treatment Plan for Primary Diagnosis: <principal problem not specified> Long Term Goal(s): Knowledge of disease and therapeutic regimen to maintain health will improve  Short Term Goals: Ability to participate in decision making will improve, Ability to verbalize feelings will improve, Ability to disclose and discuss suicidal ideas, Ability to identify and develop effective coping behaviors will improve and Compliance with prescribed medications will improve  Medication Management: RN will administer medications as ordered by provider, will assess and evaluate patient's response and provide education to patient for prescribed medication. RN will report any adverse and/or side effects to prescribing provider.  Therapeutic Interventions: 1 on 1 counseling sessions, Psychoeducation, Medication administration, Evaluate responses to treatment, Monitor vital signs and CBGs as ordered, Perform/monitor CIWA, COWS, AIMS and Fall Risk screenings as ordered, Perform wound care treatments as ordered.  Evaluation of Outcomes: Adequate for Discharge   LCSW Treatment Plan for Primary Diagnosis: <principal problem not specified> Long Term Goal(s): Safe transition to appropriate next level of care at discharge, Engage patient in therapeutic group addressing interpersonal concerns.  Short Term Goals: Engage patient in aftercare planning with referrals and resources and Increase skills for wellness and recovery  Therapeutic Interventions: Assess for all discharge needs, 1 to 1 time with Social worker, Explore available resources and support systems, Assess for adequacy in community support network, Educate family  and significant other(s) on suicide prevention, Complete Psychosocial Assessment, Interpersonal group therapy.  Evaluation of Outcomes: Adequate for Discharge   Progress in Treatment: Attending groups: Yes. Participating in  groups: Yes. Taking medication as prescribed: Yes. Toleration medication: Yes. Family/Significant other contact made: Yes, individual(s) contacted:  pt's mother Patient understands diagnosis: No. Discussing patient identified problems/goals with staff: Yes. Medical problems stabilized or resolved: Yes. Denies suicidal/homicidal ideation: Yes. Issues/concerns per patient self-inventory: No. Other:   New problem(s) identified: No, Describe:  None  New Short Term/Long Term Goal(s): Medication stabilization, elimination of SI thoughts, and development of a comprehensive mental wellness plan.   Patient Goals:    Discharge Plan or Barriers: Patient is discharging home with his parents and will be following up with [facility] in [place]  Reason for Continuation of Hospitalization: Patient is discharging today.   Estimated Length of Stay: Patient is discharging today.   Attendees: Patient: 01/02/2019   Physician: Dr. Malvin Johns, MD 01/02/2019  Nursing: Marton Redwood, RN 01/02/2019   RN Care Manager: 01/02/2019   Social Worker: Stephannie Peters, LCSW  01/02/2019   Recreational Therapist:  01/02/2019   Other:  01/02/2019  Other:  01/02/2019   Other: 01/02/2019      Scribe for Treatment Team: Delphia Grates, LCSW 01/02/2019 11:07 AM

## 2019-01-02 NOTE — [facility] Suicide Risk Assessment (Signed)
[facility] INPATIENT:  Family/Significant Other Suicide Prevention Education  Suicide Prevention Education:  Education Completed; Pt's mother, Nicholas Harrison, has been identified by the patient as the family member/significant other with whom the patient will be residing, and identified as the person(s) who will aid the patient in the event of a mental health crisis (suicidal ideations/suicide attempt).  With written consent from the patient, the family member/significant other has been provided the following suicide prevention education, prior to the and/or following the discharge of the patient.  The suicide prevention education provided includes the following:  Suicide risk factors  Suicide prevention and interventions  National Suicide Hotline telephone number  [facility] assessment telephone number  [facility] Emergency Assistance [place] and/or Residential Mobile Crisis Unit telephone number  Request made of family/significant other to:  Remove weapons (e.g., guns, rifles, knives), all items previously/currently identified as safety concern.    Remove drugs/medications (over-the-counter, prescriptions, illicit drugs), all items previously/currently identified as a safety concern.  The family member/significant other verbalizes understanding of the suicide prevention education information provided.  The family member/significant other agrees to remove the items of safety concern listed above.  CSW contacted pt's mother, Nicholas Harrison. Pt's mother discussed aftercare for the patient and how he seemed irritated on the phone last night. CSW discussed different aftercare options if the patient is not satisfied with [facility] (pt's mother stated that he didn't like outpatient care with [facility]). Pt's mother discussed setting boundaries with the patient and his drug use if he is coming back home to leave with them.   Trecia Rogers 01/02/2019, 10:06 AM

## 2019-01-02 NOTE — Progress Notes (Signed)
  [facility] Adult Case Management Discharge Plan :  Will you be returning to the same living situation after discharge:  Yes,  home with parents At discharge, do you have transportation home?: Yes,  pt's mother Do you have the ability to pay for your medications: Yes,  private insurance  Release of information consent forms completed and in the chart;  Patient's signature needed at discharge.  Patient to Follow up at: Follow-up Information    Services, [facility] Follow up on 01/04/2019.   Why: Hospital discharge appointment is Friday, 10/16 at 10:00a.  Please bring your photo ID, insurance card, proof of income and residency and current medications.  Contact information: [place] [address] [phone]           Next level of care provider has access to [facility] and Suicide Prevention discussed: Yes,  pt's mother     Has patient been referred to the Quitline?: N/A patient is not a smoker  Patient has been referred for addiction treatment: Yes  Trecia Rogers, LCSW 01/02/2019, 10:50 AM

## 2019-01-02 NOTE — Progress Notes (Signed)
Recreation Therapy Notes  INPATIENT RECREATION TR PLAN  Patient Details Name: DANIELLE LENTO MRN: [id number] DOB: 16-May-1990 Today's Date: 01/02/2019  Rec Therapy Plan Is patient appropriate for Therapeutic Recreation?: Yes Treatment times per week: about 3 days Estimated Length of Stay: 5-7 days TR Treatment/Interventions: Group participation (Comment)  Discharge Criteria Pt will be discharged from therapy if:: Discharged Treatment plan/goals/alternatives discussed and agreed upon by:: Patient/family  Discharge Summary Short term goals set: See patient care plan Short term goals met: Not met Progress toward goals comments: Groups attended Which groups?: Wellness Reason goals not met: Pt attended one group session and is discharging. Therapeutic equipment acquired: N/A Reason patient discharged from therapy: Discharge from hospital Pt/family agrees with progress & goals achieved: Yes Date patient discharged from therapy: 01/02/19    Victorino Sparrow, LRT/CTRS  Ria Comment, Yaretzy Olazabal A 01/02/2019, 11:11 AM

## 2019-01-16 ENCOUNTER — Ambulatory Visit: Payer: BC Managed Care – PPO | Admitting: Internal Medicine

## 2019-03-25 ENCOUNTER — Ambulatory Visit: Payer: BC Managed Care – PPO | Attending: Internal Medicine

## 2019-03-25 ENCOUNTER — Other Ambulatory Visit: Payer: Self-pay

## 2019-03-25 DIAGNOSIS — Z20822 Contact with and (suspected) exposure to covid-19: Secondary | ICD-10-CM | POA: Diagnosis not present

## 2019-03-26 LAB — NOVEL CORONAVIRUS, NAA: SARS-CoV-2, NAA: NOT DETECTED

## 2019-05-09 DIAGNOSIS — F411 Generalized anxiety disorder: Secondary | ICD-10-CM | POA: Diagnosis not present

## 2019-05-09 DIAGNOSIS — F1123 Opioid dependence with withdrawal: Secondary | ICD-10-CM | POA: Diagnosis not present

## 2019-05-09 DIAGNOSIS — F1199 Opioid use, unspecified with unspecified opioid-induced disorder: Secondary | ICD-10-CM | POA: Diagnosis not present

## 2019-05-09 DIAGNOSIS — F4010 Social phobia, unspecified: Secondary | ICD-10-CM | POA: Diagnosis not present

## 2019-05-14 DIAGNOSIS — Z5181 Encounter for therapeutic drug level monitoring: Secondary | ICD-10-CM | POA: Diagnosis not present

## 2019-05-14 DIAGNOSIS — F1120 Opioid dependence, uncomplicated: Secondary | ICD-10-CM | POA: Diagnosis not present

## 2019-05-14 DIAGNOSIS — F1199 Opioid use, unspecified with unspecified opioid-induced disorder: Secondary | ICD-10-CM | POA: Diagnosis not present

## 2019-05-14 DIAGNOSIS — Z79899 Other long term (current) drug therapy: Secondary | ICD-10-CM | POA: Diagnosis not present

## 2019-05-28 DIAGNOSIS — R4689 Other symptoms and signs involving appearance and behavior: Secondary | ICD-10-CM | POA: Diagnosis not present

## 2019-05-28 DIAGNOSIS — F1199 Opioid use, unspecified with unspecified opioid-induced disorder: Secondary | ICD-10-CM | POA: Diagnosis not present

## 2019-05-28 DIAGNOSIS — Z0000 Encounter for general adult medical examination without abnormal findings: Secondary | ICD-10-CM | POA: Diagnosis not present

## 2019-05-28 DIAGNOSIS — F1120 Opioid dependence, uncomplicated: Secondary | ICD-10-CM | POA: Diagnosis not present

## 2019-05-28 DIAGNOSIS — Z5181 Encounter for therapeutic drug level monitoring: Secondary | ICD-10-CM | POA: Diagnosis not present

## 2019-05-28 DIAGNOSIS — Z13220 Encounter for screening for lipoid disorders: Secondary | ICD-10-CM | POA: Diagnosis not present

## 2019-05-28 DIAGNOSIS — E559 Vitamin D deficiency, unspecified: Secondary | ICD-10-CM | POA: Diagnosis not present

## 2019-05-28 DIAGNOSIS — Z79899 Other long term (current) drug therapy: Secondary | ICD-10-CM | POA: Diagnosis not present

## 2019-05-28 DIAGNOSIS — E538 Deficiency of other specified B group vitamins: Secondary | ICD-10-CM | POA: Diagnosis not present

## 2019-06-11 DIAGNOSIS — Z5181 Encounter for therapeutic drug level monitoring: Secondary | ICD-10-CM | POA: Diagnosis not present

## 2019-06-11 DIAGNOSIS — Z79899 Other long term (current) drug therapy: Secondary | ICD-10-CM | POA: Diagnosis not present

## 2019-06-11 DIAGNOSIS — F1120 Opioid dependence, uncomplicated: Secondary | ICD-10-CM | POA: Diagnosis not present

## 2019-06-11 DIAGNOSIS — F1199 Opioid use, unspecified with unspecified opioid-induced disorder: Secondary | ICD-10-CM | POA: Diagnosis not present

## 2019-06-25 DIAGNOSIS — Z79899 Other long term (current) drug therapy: Secondary | ICD-10-CM | POA: Diagnosis not present

## 2019-07-09 DIAGNOSIS — Z5181 Encounter for therapeutic drug level monitoring: Secondary | ICD-10-CM | POA: Diagnosis not present

## 2019-07-09 DIAGNOSIS — Z79899 Other long term (current) drug therapy: Secondary | ICD-10-CM | POA: Diagnosis not present

## 2019-07-09 DIAGNOSIS — F1199 Opioid use, unspecified with unspecified opioid-induced disorder: Secondary | ICD-10-CM | POA: Diagnosis not present

## 2019-07-09 DIAGNOSIS — F332 Major depressive disorder, recurrent severe without psychotic features: Secondary | ICD-10-CM | POA: Diagnosis not present

## 2019-07-09 DIAGNOSIS — F1120 Opioid dependence, uncomplicated: Secondary | ICD-10-CM | POA: Diagnosis not present

## 2019-07-23 DIAGNOSIS — F1120 Opioid dependence, uncomplicated: Secondary | ICD-10-CM | POA: Diagnosis not present

## 2019-07-23 DIAGNOSIS — Z79899 Other long term (current) drug therapy: Secondary | ICD-10-CM | POA: Diagnosis not present

## 2019-07-23 DIAGNOSIS — Z5181 Encounter for therapeutic drug level monitoring: Secondary | ICD-10-CM | POA: Diagnosis not present

## 2019-07-23 DIAGNOSIS — F1199 Opioid use, unspecified with unspecified opioid-induced disorder: Secondary | ICD-10-CM | POA: Diagnosis not present

## 2019-08-05 DIAGNOSIS — F1123 Opioid dependence with withdrawal: Secondary | ICD-10-CM | POA: Diagnosis not present

## 2019-08-05 DIAGNOSIS — F1120 Opioid dependence, uncomplicated: Secondary | ICD-10-CM | POA: Diagnosis not present

## 2019-08-05 DIAGNOSIS — F411 Generalized anxiety disorder: Secondary | ICD-10-CM | POA: Diagnosis not present

## 2019-08-05 DIAGNOSIS — Z79899 Other long term (current) drug therapy: Secondary | ICD-10-CM | POA: Diagnosis not present

## 2019-08-05 DIAGNOSIS — F4010 Social phobia, unspecified: Secondary | ICD-10-CM | POA: Diagnosis not present

## 2019-09-09 ENCOUNTER — Other Ambulatory Visit: Payer: Self-pay

## 2019-09-09 ENCOUNTER — Emergency Department (HOSPITAL_COMMUNITY)
Admission: EM | Admit: 2019-09-09 | Discharge: 2019-09-10 | Disposition: A | Discharge status: Home / Self Care | Payer: BC Managed Care – PPO | Attending: Emergency Medicine | Admitting: Emergency Medicine

## 2019-09-09 ENCOUNTER — Encounter (HOSPITAL_COMMUNITY): Payer: Self-pay | Admitting: *Deleted

## 2019-09-09 DIAGNOSIS — F1123 Opioid dependence with withdrawal: Secondary | ICD-10-CM

## 2019-09-09 DIAGNOSIS — E860 Dehydration: Secondary | ICD-10-CM | POA: Diagnosis not present

## 2019-09-09 DIAGNOSIS — R112 Nausea with vomiting, unspecified: Secondary | ICD-10-CM | POA: Diagnosis present

## 2019-09-09 DIAGNOSIS — F1310 Sedative, hypnotic or anxiolytic abuse, uncomplicated: Secondary | ICD-10-CM

## 2019-09-09 DIAGNOSIS — F13139 Sedative, hypnotic or anxiolytic abuse with withdrawal, unspecified: Secondary | ICD-10-CM | POA: Diagnosis not present

## 2019-09-09 DIAGNOSIS — I10 Essential (primary) hypertension: Secondary | ICD-10-CM | POA: Diagnosis not present

## 2019-09-09 DIAGNOSIS — F13930 Sedative, hypnotic or anxiolytic use, unspecified with withdrawal, uncomplicated: Secondary | ICD-10-CM

## 2019-09-09 DIAGNOSIS — F1193 Opioid use, unspecified with withdrawal: Secondary | ICD-10-CM

## 2019-09-09 DIAGNOSIS — Z888 Allergy status to other drugs, medicaments and biological substances status: Secondary | ICD-10-CM | POA: Diagnosis not present

## 2019-09-09 DIAGNOSIS — F1910 Other psychoactive substance abuse, uncomplicated: Secondary | ICD-10-CM | POA: Diagnosis not present

## 2019-09-09 DIAGNOSIS — F1110 Opioid abuse, uncomplicated: Secondary | ICD-10-CM | POA: Diagnosis not present

## 2019-09-09 DIAGNOSIS — F1113 Opioid abuse with withdrawal: Secondary | ICD-10-CM | POA: Diagnosis not present

## 2019-09-09 DIAGNOSIS — F17210 Nicotine dependence, cigarettes, uncomplicated: Secondary | ICD-10-CM | POA: Diagnosis not present

## 2019-09-09 LAB — TROPONIN I (HIGH SENSITIVITY)
Troponin I (High Sensitivity): 2 ng/L (ref <18)
Troponin I (High Sensitivity): 2 ng/L (ref <18)

## 2019-09-09 LAB — CBC
HCT: 43.3 % (ref 39.0–52.0)
Hemoglobin: 15.3 g/dL (ref 13.0–17.0)
MCH: 29.4 pg (ref 26.0–34.0)
MCHC: 35.3 g/dL (ref 30.0–36.0)
MCV: 83.1 fL (ref 80.0–100.0)
Platelets: 370 10*3/uL (ref 150–400)
RBC: 5.21 MIL/uL (ref 4.22–5.81)
RDW: 12.4 % (ref 11.5–15.5)
WBC: 14.7 10*3/uL — ABNORMAL HIGH (ref 4.0–10.5)
nRBC: 0 % (ref 0.0–0.2)

## 2019-09-09 LAB — BASIC METABOLIC PANEL
Anion gap: 16 — ABNORMAL HIGH (ref 5–15)
BUN: 18 mg/dL (ref 6–20)
CO2: 20 mmol/L — ABNORMAL LOW (ref 22–32)
Calcium: 10.2 mg/dL (ref 8.9–10.3)
Chloride: 103 mmol/L (ref 98–111)
Creatinine, Ser: 0.8 mg/dL (ref 0.61–1.24)
GFR calc Af Amer: >60 mL/min (ref >60)
GFR calc non Af Amer: >60 mL/min (ref >60)
Glucose, Bld: 150 mg/dL — ABNORMAL HIGH (ref 70–99)
Potassium: 3.1 mmol/L — ABNORMAL LOW (ref 3.5–5.1)
Sodium: 139 mmol/L (ref 135–145)

## 2019-09-09 MED ORDER — HALOPERIDOL LACTATE 5 MG/ML IJ SOLN
2.0000 mg | Freq: Once | INTRAMUSCULAR | Status: AC
  Administered 2019-09-09: 2 mg via INTRAVENOUS
  Filled 2019-09-09: qty 1

## 2019-09-09 MED ORDER — DICYCLOMINE HCL 10 MG PO CAPS
20.0000 mg | ORAL_CAPSULE | Freq: Four times a day (QID) | ORAL | Status: DC | PRN
  Administered 2019-09-10: 20 mg via ORAL
  Filled 2019-09-09: qty 2

## 2019-09-09 MED ORDER — LOPERAMIDE HCL 2 MG PO CAPS: 2.0000 mg | ORAL_CAPSULE | ORAL | Status: DC | PRN

## 2019-09-09 MED ORDER — HYDROXYZINE HCL 25 MG PO TABS
25.0000 mg | ORAL_TABLET | Freq: Four times a day (QID) | ORAL | Status: DC | PRN
  Administered 2019-09-10: 25 mg via ORAL
  Filled 2019-09-09: qty 1

## 2019-09-09 MED ORDER — ONDANSETRON 4 MG PO TBDP
4.0000 mg | ORAL_TABLET | Freq: Four times a day (QID) | ORAL | Status: DC | PRN
  Administered 2019-09-09: 4 mg via ORAL
  Filled 2019-09-09: qty 1

## 2019-09-09 MED ORDER — CLONIDINE HCL 0.1 MG PO TABS
0.1000 mg | ORAL_TABLET | Freq: Four times a day (QID) | ORAL | Status: DC
  Administered 2019-09-10: 0.1 mg via ORAL
  Filled 2019-09-09: qty 1

## 2019-09-09 MED ORDER — NAPROXEN 250 MG PO TABS
500.0000 mg | ORAL_TABLET | Freq: Two times a day (BID) | ORAL | Status: DC | PRN
  Administered 2019-09-10: 500 mg via ORAL
  Filled 2019-09-09: qty 2

## 2019-09-09 MED ORDER — ONDANSETRON 8 MG PO TBDP
8.0000 mg | ORAL_TABLET | Freq: Once | ORAL | Status: AC
  Administered 2019-09-09: 8 mg via ORAL
  Filled 2019-09-09: qty 1

## 2019-09-09 MED ORDER — POTASSIUM CHLORIDE CRYS ER 20 MEQ PO TBCR
40.0000 meq | EXTENDED_RELEASE_TABLET | Freq: Once | ORAL | Status: AC
  Administered 2019-09-10: 40 meq via ORAL
  Filled 2019-09-09: qty 2

## 2019-09-09 MED ORDER — METHOCARBAMOL 500 MG PO TABS
500.0000 mg | ORAL_TABLET | Freq: Three times a day (TID) | ORAL | Status: DC | PRN
  Administered 2019-09-10: 500 mg via ORAL
  Filled 2019-09-09: qty 1

## 2019-09-09 MED ORDER — SODIUM CHLORIDE 0.9% FLUSH
3.0000 mL | Freq: Once | INTRAVENOUS | Status: AC
  Administered 2019-09-09: 3 mL via INTRAVENOUS

## 2019-09-09 MED ORDER — CLONIDINE HCL 0.1 MG PO TABS: 0.1000 mg | ORAL_TABLET | Freq: Two times a day (BID) | ORAL | Status: DC

## 2019-09-09 MED ORDER — DEXTROSE-NACL 5-0.45 % IV SOLN: INTRAVENOUS | Status: DC

## 2019-09-09 MED ORDER — ACETAMINOPHEN 325 MG PO TABS
650.0000 mg | ORAL_TABLET | Freq: Once | ORAL | Status: AC
  Administered 2019-09-09: 650 mg via ORAL
  Filled 2019-09-09: qty 2

## 2019-09-09 MED ORDER — CLONIDINE HCL 0.1 MG PO TABS: 0.1000 mg | ORAL_TABLET | Freq: Every day | ORAL | Status: DC

## 2019-09-09 MED ORDER — LACTATED RINGERS IV BOLUS
1000.0000 mL | Freq: Once | INTRAVENOUS | Status: AC
  Administered 2019-09-09: 1000 mL via INTRAVENOUS

## 2019-09-09 NOTE — ED Triage Notes (Signed)
States he has an opiate addiction, last used 3 days ago, c/o sweats and mood swings

## 2019-09-09 NOTE — ED Provider Notes (Addendum)
[facility] Provider Note   CSN: [id number] Arrival date & time: 09/09/19  1640     History Chief Complaint  Patient presents with  . Withdrawal    Nicholas Harrison is a 29 y.o. male.  HPI    29 year old male comes in a chief complaint of withdrawal.  Patient has history of benzo and opiate misuse.  Patient is not quite clear with this timeline, but he reports that he used some Xanax and opiates late last week.  He thinks his last use was either Friday or Saturday.  He started feeling unwell yesterday and his symptoms have progressed.  He is having nausea, vomiting, diarrhea, chills.  Patient's mother at the bedside.  She has already contacted facilities for rehab.  Patient denies any SI, HI.  Past Medical History:  Diagnosis Date  . Anxiety   . Depression   . Drug-induced seizure ([place])   . Seizures [facility])     Patient Active Problem List   Diagnosis Date Noted  . 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 Delirium 12/27/2018  . Seizure disorder ([place]) 07/24/2018  . 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 Polysubstance abuse ([place]) 10/19/2016  . 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 Moderate tetrahydrocannabinol (THC) dependence ([place]) 05/31/2016  . Opioid use disorder, severe, dependence ([place]) 10/29/2014  . ADD (attention deficit disorder) 02/15/2012  . Well adult exam 09/02/2011  . Rash 09/02/2011  . Chronic anxiety 06/05/2011    Past Surgical History:  Procedure Laterality Date  . HAND SURGERY         Family History  Problem Relation Age of Onset  . Depression Mother        anxiety  . Diabetes Maternal Grandfather     Social History   Tobacco Use  . Smoking status: Current Some Day Smoker  . Smokeless tobacco: Former Systems developer    Types: Chew  . Tobacco comment: only experimented    Substance Use Topics  . Alcohol use: Yes    Comment: occasionally   . Drug use: Yes    Types: Benzodiazepines, Oxycodone    Comment: pt reports he takes Oxycodone and Xanax, last used 2 weeks ago as of 07/18/18    Home Medications Prior to Admission medications   Medication Sig Start Date End Date Taking? Authorizing Provider  naloxone [facility]) nasal spray 4 mg/0.1 mL Place 0.4 mg into the nose once.  05/14/19  Yes [provider]  buprenorphine-naloxone (SUBOXONE) 8-2 mg SUBL SL tablet Place 1 tablet under the tongue 2 (two) times daily. Patient not taking: Reported on 09/09/2019 08/07/19   [provider]  busPIRone (BUSPAR) 15 MG tablet Take 15 mg by mouth 3 (three) times daily. Patient not taking: Reported on 09/09/2019 08/30/19   [provider]  FLUoxetine (PROZAC) 40 MG capsule Take 40 mg by mouth at bedtime. Patient not taking: Reported on 09/09/2019 09/02/19   [provider]    Allergies    Buspar [buspirone] and Seroquel [quetiapine fumarate]  Review of Systems   Review of Systems  Constitutional: Positive for activity change and diaphoresis.  Respiratory: Negative for shortness of breath.   Gastrointestinal: Positive for diarrhea, nausea and vomiting.  Allergic/Immunologic: Negative for immunocompromised state.  All other systems reviewed and are negative.   Physical Exam Updated Vital Signs BP 116/71 (BP Location: Right Arm)   Pulse 86  Temp 98.3 F (36.8 C)   Resp 16   Wt 83.9 kg   SpO2 100%   BMI 26.54 kg/m   Physical Exam Vitals and nursing note reviewed.  Constitutional:      Appearance: He is well-developed.  HENT:     Head: Atraumatic.  Cardiovascular:     Rate and Rhythm: Normal rate.  Pulmonary:     Effort: Pulmonary effort is normal.  Musculoskeletal:     Cervical back: Neck supple.  Skin:    General: Skin is warm.  Neurological:     Mental Status: He is alert and oriented to person, place, and time.      ED Results / Procedures / Treatments   Labs (all labs ordered are listed, but only abnormal results are displayed) Labs Reviewed  BASIC METABOLIC PANEL - Abnormal; Notable for the following components:      Result Value   Potassium 3.1 (*)    CO2 20 (*)    Glucose, Bld 150 (*)    Anion gap 16 (*)    All other components within normal limits  CBC - Abnormal; Notable for the following components:   WBC 14.7 (*)    All other components within normal limits  SARS CORONAVIRUS 2 BY RT PCR (HOSPITAL ORDER, PERFORMED IN [facility] HOSPITAL LAB)  TROPONIN I (HIGH SENSITIVITY)  TROPONIN I (HIGH SENSITIVITY)    EKG EKG Interpretation  Date/Time:  Monday September 09 2019 17:55:32 EDT Ventricular Rate:  71 PR Interval:  136 QRS Duration: 106 QT Interval:  550 QTC Calculation: 597 R Axis:   85 Text Interpretation: Normal sinus rhythm with sinus arrhythmia Incomplete right bundle branch block T wave abnormality, consider anterior ischemia Prolonged QT Abnormal ECG No significant change since last tracing nonspecific ST and T waves changes are not new Confirmed by Derwood Kaplan [phone]) on 09/09/2019 6:31:49 PM   Radiology No results found.  Procedures Procedures (including critical care time)  Medications Ordered in ED Medications  sodium chloride flush (NS) 0.9 % injection 3 mL (3 mLs Intravenous Given 09/09/19 2030)  acetaminophen (TYLENOL) tablet 650 mg (650 mg Oral Given 09/09/19 1845)  ondansetron (ZOFRAN-ODT) disintegrating tablet 8 mg (8 mg Oral Given 09/09/19 1845)  haloperidol lactate (HALDOL) injection 2 mg (2 mg Intravenous Given 09/09/19 2031)  lactated ringers bolus 1,000 mL (0 mLs Intravenous Stopped 09/09/19 2145)  potassium chloride SA (KLOR-CON) CR tablet 40 mEq (40 mEq Oral Given 09/10/19 0150)  sodium chloride 0.9 % bolus 1,000 mL (0 mLs Intravenous Stopped 09/10/19 0411)  metoCLOPramide (REGLAN) injection 10 mg (10 mg Intravenous Given 09/10/19 0151)  diphenhydrAMINE  (BENADRYL) injection 25 mg (25 mg Intravenous Given 09/10/19 0152)    ED Course  I have reviewed the triage vital signs and the nursing notes.  Pertinent labs & imaging results that were available during my care of the patient were reviewed by me and considered in my medical decision making (see chart for details).    MDM Rules/Calculators/A&P                          29 year old comes in a chief complaint of withdrawals.  He has history of benzo and opiate abuse.  Patient is not tachycardic and hemodynamically stable.  His symptoms otherwise are consistent with withdrawals from either opiate or benzo.  Impairing the latter given the lack of tachycardia.  Additionally patient also admits to marijuana use, that could be contributing.  Patient had  profound emesis in the ER.  We gave him Zofran followed by Haldol, and patient is still throwing up.  P.o. challenge has been initiated.  Mother reports that patient has been accepted at a rehab facility tomorrow morning. Plan is for patient to get symptom relief in the ER, and then discharged to mom early in the morning.  If patient's symptoms get worse and he will need admission.  Labs are reassuring.  Final Clinical Impression(s) / ED Diagnoses Final diagnoses:  Opiate abuse, continuous (HCC)  Benzodiazepine abuse (HCC)  Withdrawal from opioids (HCC)  Benzodiazepine withdrawal without complication (HCC)  Dehydration    Rx / DC Orders ED Discharge Orders    None           Derwood Kaplan, MD 09/10/19 1458

## 2019-09-10 MED ORDER — DIPHENHYDRAMINE HCL 50 MG/ML IJ SOLN
25.0000 mg | Freq: Once | INTRAMUSCULAR | Status: AC
  Administered 2019-09-10: 25 mg via INTRAVENOUS
  Filled 2019-09-10: qty 1

## 2019-09-10 MED ORDER — SODIUM CHLORIDE 0.9 % IV BOLUS: 1000.0000 mL | Freq: Once | INTRAVENOUS | Status: DC

## 2019-09-10 MED ORDER — METOCLOPRAMIDE HCL 5 MG/ML IJ SOLN
10.0000 mg | Freq: Once | INTRAMUSCULAR | Status: AC
  Administered 2019-09-10: 10 mg via INTRAVENOUS
  Filled 2019-09-10: qty 2

## 2019-09-10 MED ORDER — SODIUM CHLORIDE 0.9 % IV BOLUS
1000.0000 mL | Freq: Once | INTRAVENOUS | Status: AC
  Administered 2019-09-10: 1000 mL via INTRAVENOUS

## 2019-09-10 NOTE — Discharge Instructions (Signed)
Please go straight to the outpatient facility that your mother has made arrangements for you to be admitted to get help for your substance abuse problems.  Good luck on your journey to become sober!

## 2019-09-10 NOTE — ED Provider Notes (Signed)
Patient was left at change of shift for withdrawal symptoms from benzodiazepines and opiates.  He has an appointment in the morning to be admitted to an inpatient facility.  He was started on the clonidine withdrawal protocol.  His main issue has been intractable nausea and vomiting.  When I rechecked him he is sleeping but easily awakened.  He states he still has some nausea and would like something else.  His urinal is at the bedside and is noted to be very dark.  I discussed with him that he probably is still dehydrated and should have his IV reinserted, it was accidentally pulled out earlier.  He is agreeable.  4:00 AM nurses inform me patient has removed his IV again, third time during his ED visit..  We will not reinsert it.  He is waiting for his mother to come pick him up in the morning to take him to his outpatient facility for his substance abuse.     Devoria Albe, MD 09/10/19 [phone]

## 2019-12-27 IMAGING — CT CT HEAD W/O CM
3 series · 16 of 47 positions shown, 19 images · non-contrast
Comparison: None.

CLINICAL DATA: Seizure with altered mental status

EXAM:
CT HEAD WITHOUT CONTRAST
TECHNIQUE: Contiguous axial images were obtained from the base of the skull
through the vertex without intravenous contrast.

[Series 2: head trauma wo · axial · 0.45mm/px · z∈[+35,+175]mm · 10 of 34 slices shown, 13 images]
[im 3/34  brain]
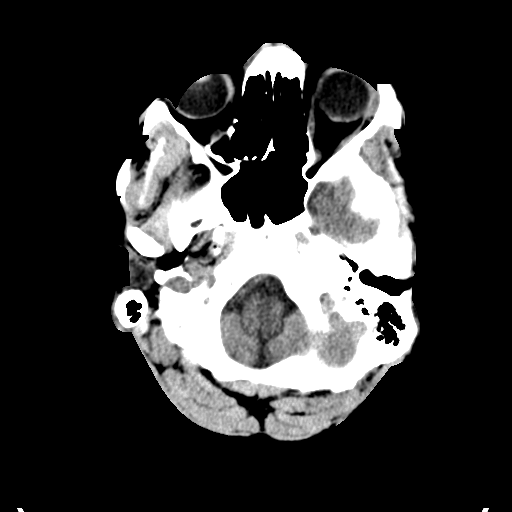
[im 3/34  bone]
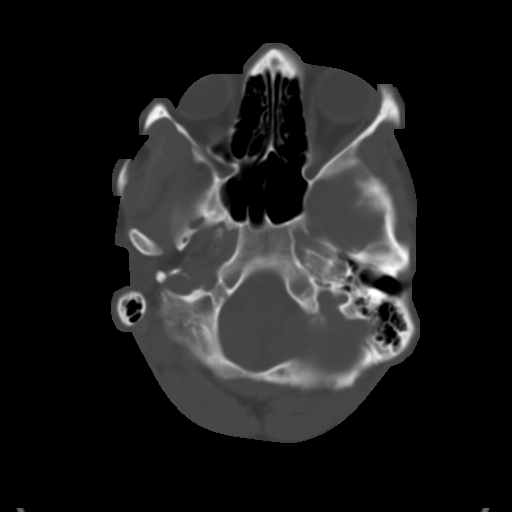
[im 6/34  brain]
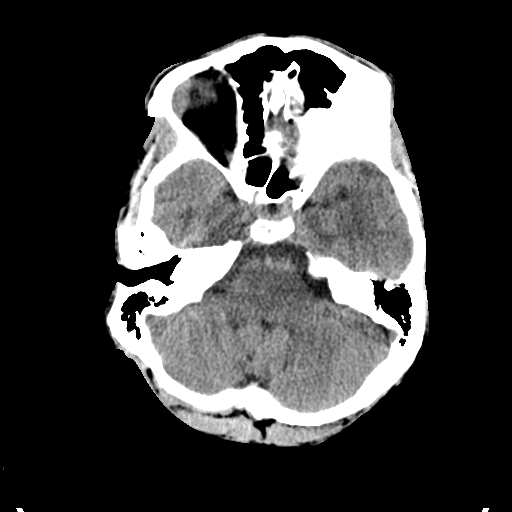
[im 10/34  brain]
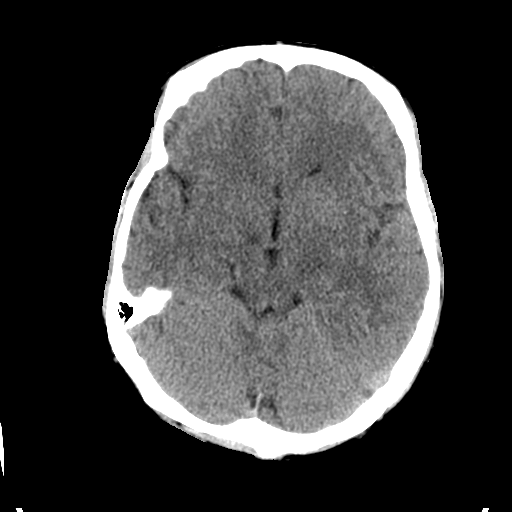
[im 12/34  brain]
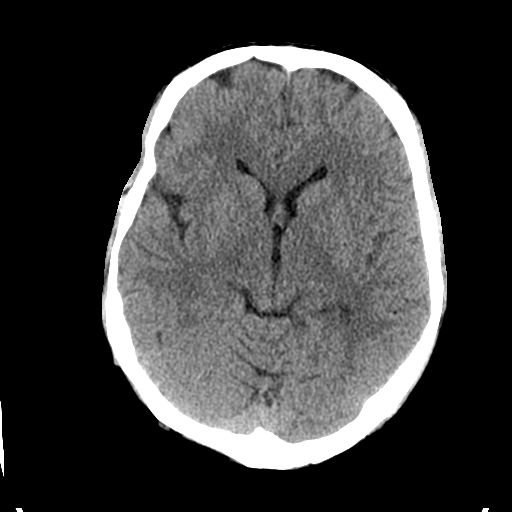
[im 15/34  brain]
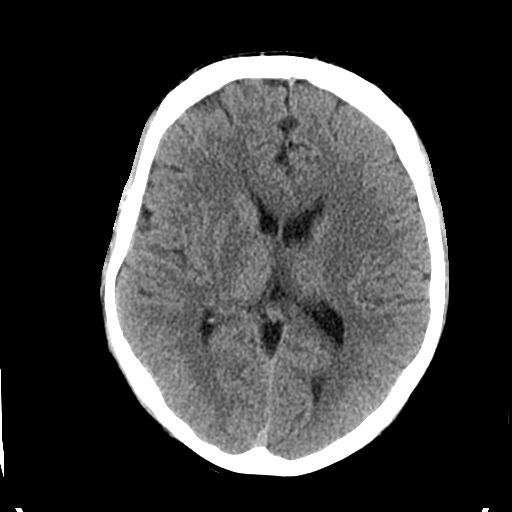
[im 15/34  bone]
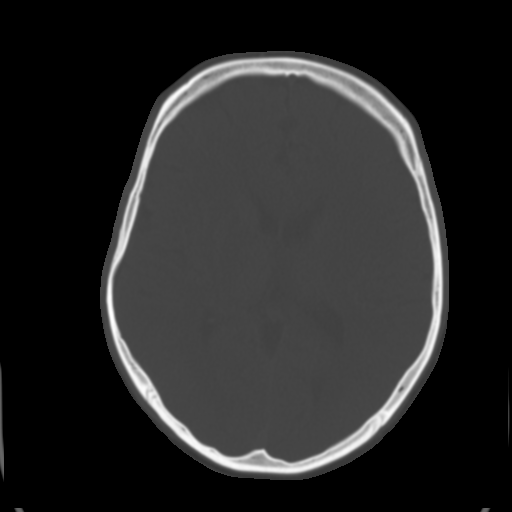
[im 19/34  brain]
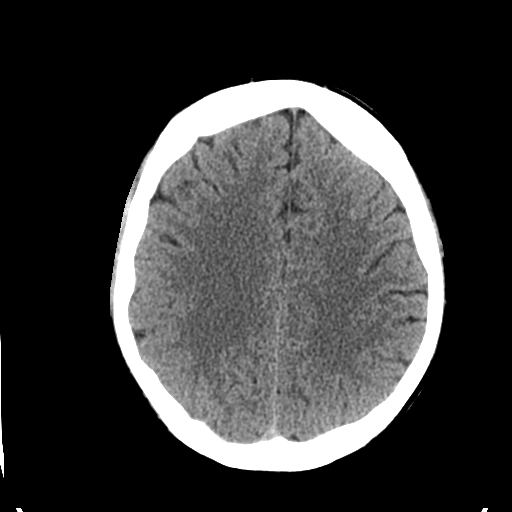
[im 22/34  brain]
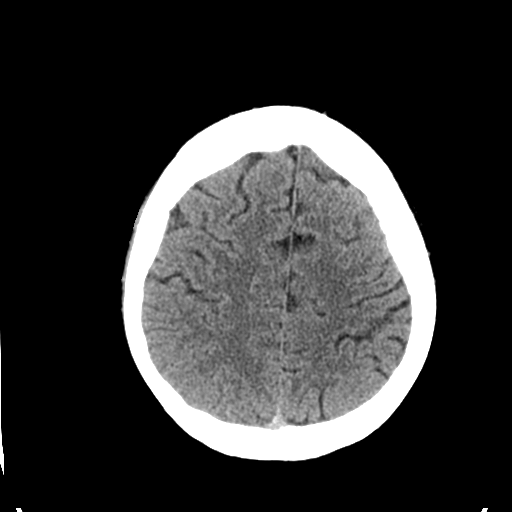
[im 26/34  brain]
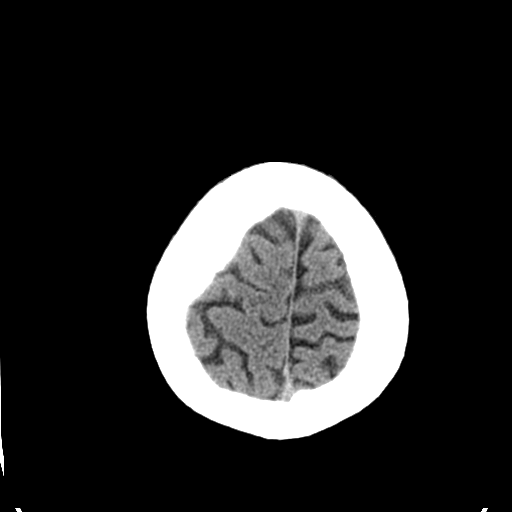
[im 28/34  brain]
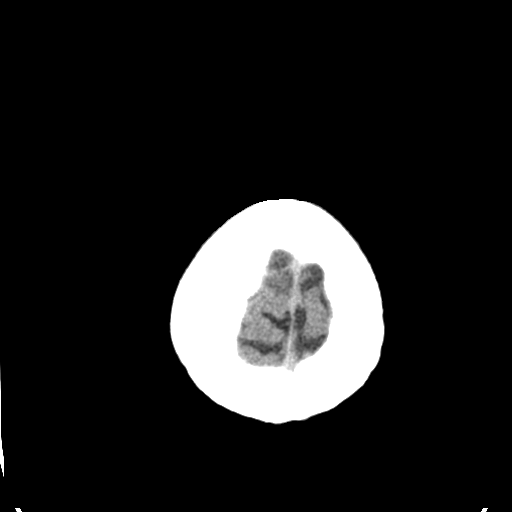
[im 28/34  bone]
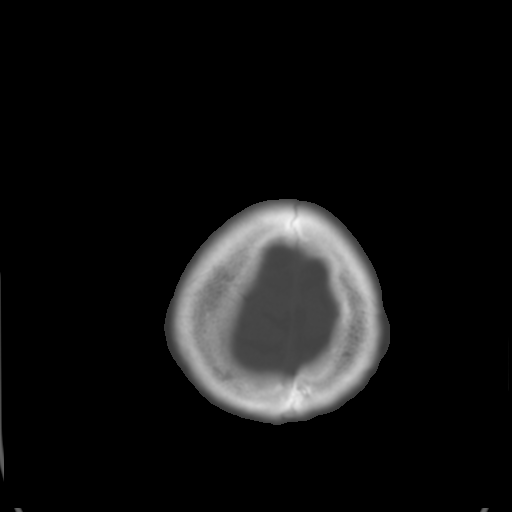
[im 31/34  brain]
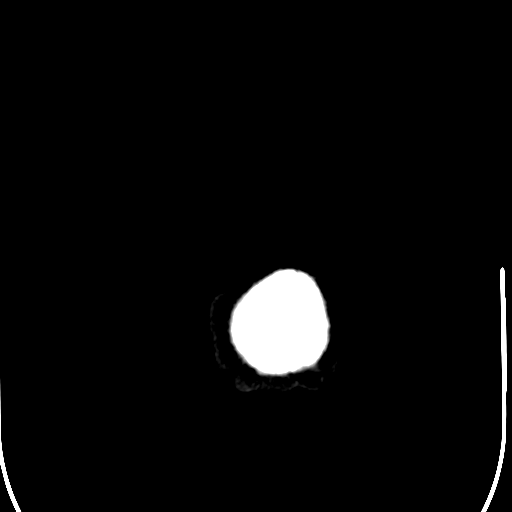

[Series 4: coronal soft tissue · coronal · 0.35mm/px · 3 of 71 slices shown]
[im 24/71  brain]
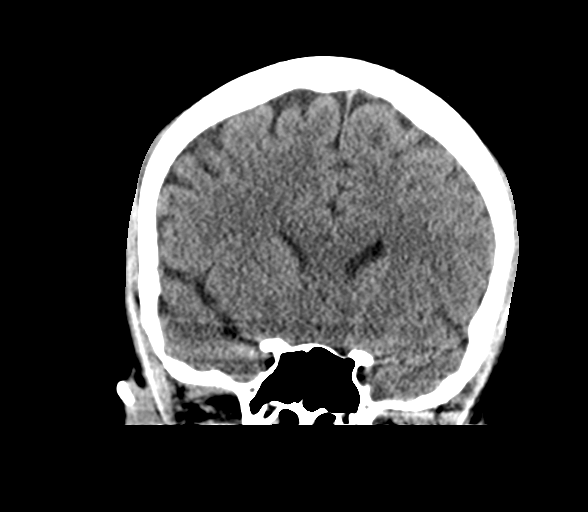
[im 32/71  brain]
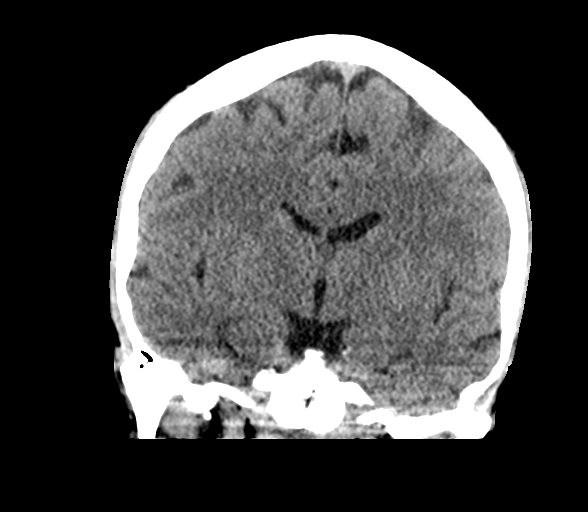
[im 39/71  brain]
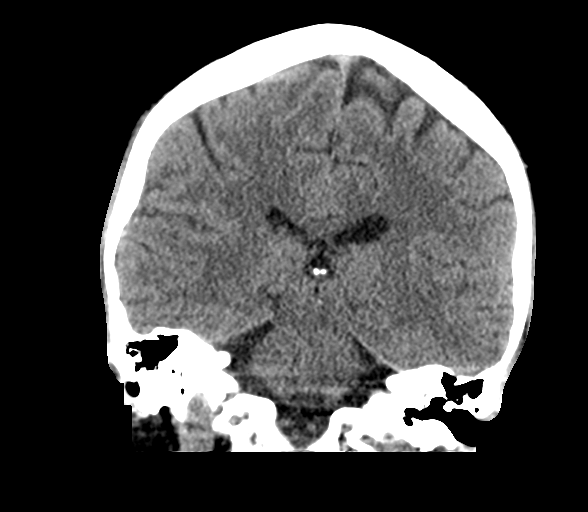

[Series 5: sagittal soft tissue · sagittal · 0.35mm/px · 3 of 72 slices shown]
[im 24/72  brain]
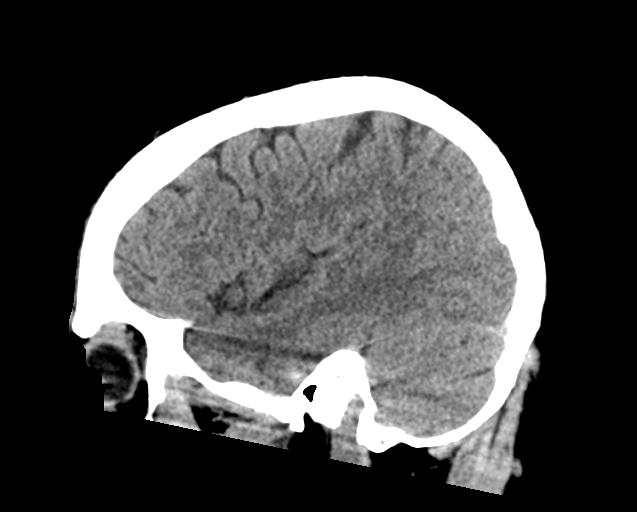
[im 36/72  brain]
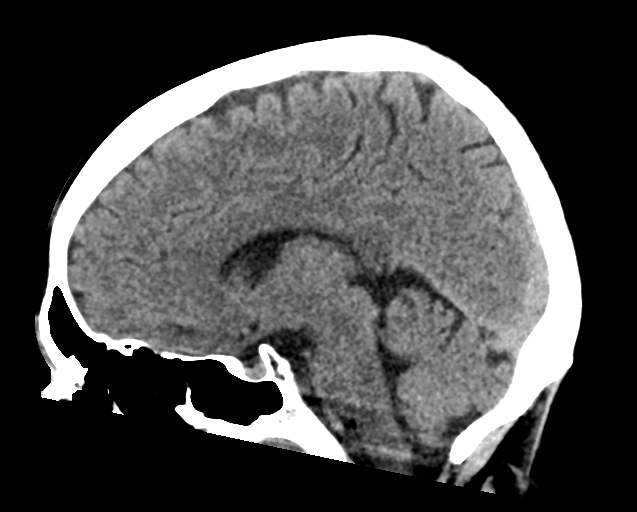
[im 48/72  brain]
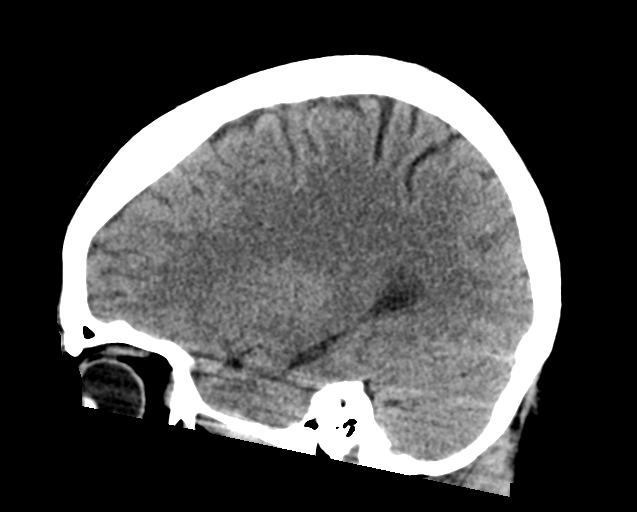

[16 of 47 positions shown; findings below may reference images not displayed]

FINDINGS: Brain: The ventricles are normal in size and configuration. There is
no intracranial mass, hemorrhage, extra-axial fluid collection, or
midline shift. Gray-white compartments are normal. No evident acute
infarct.

Vascular: No hyperdense vessel.  No evident vascular calcification.

Skull: Bony calvarium appears intact.

Sinuses/Orbits: Visualized paranasal sinuses are clear. Visualized
orbits appear symmetric bilaterally.

Other: Visualized mastoid air cells are clear.
IMPRESSION: Study within normal limits.

## 2020-01-07 DIAGNOSIS — L738 Other specified follicular disorders: Secondary | ICD-10-CM | POA: Diagnosis not present

## 2020-01-07 DIAGNOSIS — L700 Acne vulgaris: Secondary | ICD-10-CM | POA: Diagnosis not present

## 2020-01-08 DIAGNOSIS — F1020 Alcohol dependence, uncomplicated: Secondary | ICD-10-CM | POA: Diagnosis not present

## 2020-02-26 ENCOUNTER — Other Ambulatory Visit: Payer: Self-pay

## 2020-02-26 ENCOUNTER — Encounter (HOSPITAL_COMMUNITY): Payer: Self-pay

## 2020-02-26 ENCOUNTER — Emergency Department (HOSPITAL_COMMUNITY): Payer: BC Managed Care – PPO

## 2020-02-26 ENCOUNTER — Emergency Department (HOSPITAL_COMMUNITY)
Admission: EM | Admit: 2020-02-26 | Discharge: 2020-02-26 | Disposition: A | Discharge status: Home / Self Care | Payer: BC Managed Care – PPO | Attending: Emergency Medicine | Admitting: Emergency Medicine

## 2020-02-26 DIAGNOSIS — T50901A Poisoning by unspecified drugs, medicaments and biological substances, accidental (unintentional), initial encounter: Secondary | ICD-10-CM

## 2020-02-26 DIAGNOSIS — T402X1A Poisoning by other opioids, accidental (unintentional), initial encounter: Secondary | ICD-10-CM | POA: Insufficient documentation

## 2020-02-26 DIAGNOSIS — Z79899 Other long term (current) drug therapy: Secondary | ICD-10-CM | POA: Diagnosis not present

## 2020-02-26 DIAGNOSIS — Z20822 Contact with and (suspected) exposure to covid-19: Secondary | ICD-10-CM | POA: Diagnosis not present

## 2020-02-26 DIAGNOSIS — F17200 Nicotine dependence, unspecified, uncomplicated: Secondary | ICD-10-CM | POA: Diagnosis not present

## 2020-02-26 DIAGNOSIS — R404 Transient alteration of awareness: Secondary | ICD-10-CM | POA: Diagnosis not present

## 2020-02-26 DIAGNOSIS — R0902 Hypoxemia: Secondary | ICD-10-CM | POA: Diagnosis not present

## 2020-02-26 DIAGNOSIS — R4182 Altered mental status, unspecified: Secondary | ICD-10-CM | POA: Diagnosis not present

## 2020-02-26 DIAGNOSIS — R000 Tachycardia, unspecified: Secondary | ICD-10-CM | POA: Diagnosis not present

## 2020-02-26 DIAGNOSIS — T50904A Poisoning by unspecified drugs, medicaments and biological substances, undetermined, initial encounter: Secondary | ICD-10-CM | POA: Diagnosis not present

## 2020-02-26 DIAGNOSIS — R0602 Shortness of breath: Secondary | ICD-10-CM | POA: Diagnosis not present

## 2020-02-26 DIAGNOSIS — N289 Disorder of kidney and ureter, unspecified: Secondary | ICD-10-CM

## 2020-02-26 LAB — RAPID URINE DRUG SCREEN, HOSP PERFORMED
Amphetamines: NOT DETECTED
Barbiturates: NOT DETECTED
Benzodiazepines: POSITIVE — AB
Cocaine: NOT DETECTED
Opiates: POSITIVE — AB
Tetrahydrocannabinol: POSITIVE — AB

## 2020-02-26 LAB — RESP PANEL BY RT-PCR (FLU A&B, COVID) ARPGX2
Influenza A by PCR: NEGATIVE
Influenza B by PCR: NEGATIVE
SARS Coronavirus 2 by RT PCR: NEGATIVE

## 2020-02-26 LAB — COMPREHENSIVE METABOLIC PANEL
ALT: 19 U/L (ref 0–44)
AST: 30 U/L (ref 15–41)
Albumin: 4.7 g/dL (ref 3.5–5.0)
Alkaline Phosphatase: 64 U/L (ref 38–126)
Anion gap: 15 (ref 5–15)
BUN: 14 mg/dL (ref 6–20)
CO2: 20 mmol/L — ABNORMAL LOW (ref 22–32)
Calcium: 9.2 mg/dL (ref 8.9–10.3)
Chloride: 104 mmol/L (ref 98–111)
Creatinine, Ser: 1.3 mg/dL — ABNORMAL HIGH (ref 0.61–1.24)
GFR, Estimated: >60 mL/min (ref >60)
Glucose, Bld: 103 mg/dL — ABNORMAL HIGH (ref 70–99)
Potassium: 4.6 mmol/L (ref 3.5–5.1)
Sodium: 139 mmol/L (ref 135–145)
Total Bilirubin: 0.5 mg/dL (ref 0.3–1.2)
Total Protein: 7.5 g/dL (ref 6.5–8.1)

## 2020-02-26 LAB — CBC WITH DIFFERENTIAL/PLATELET
Abs Immature Granulocytes: 0.07 10*3/uL (ref 0.00–0.07)
Basophils Absolute: 0.1 10*3/uL (ref 0.0–0.1)
Basophils Relative: 0 %
Eosinophils Absolute: 0 10*3/uL (ref 0.0–0.5)
Eosinophils Relative: 0 %
HCT: 47.5 % (ref 39.0–52.0)
Hemoglobin: 16 g/dL (ref 13.0–17.0)
Immature Granulocytes: 1 %
Lymphocytes Relative: 5 %
Lymphs Abs: 0.8 10*3/uL (ref 0.7–4.0)
MCH: 29.3 pg (ref 26.0–34.0)
MCHC: 33.7 g/dL (ref 30.0–36.0)
MCV: 87 fL (ref 80.0–100.0)
Monocytes Absolute: 0.9 10*3/uL (ref 0.1–1.0)
Monocytes Relative: 6 %
Neutro Abs: 13.4 10*3/uL — ABNORMAL HIGH (ref 1.7–7.7)
Neutrophils Relative %: 88 %
Platelets: 293 10*3/uL (ref 150–400)
RBC: 5.46 MIL/uL (ref 4.22–5.81)
RDW: 12.3 % (ref 11.5–15.5)
WBC: 15.3 10*3/uL — ABNORMAL HIGH (ref 4.0–10.5)
nRBC: 0 % (ref 0.0–0.2)

## 2020-02-26 LAB — SALICYLATE LEVEL: Salicylate Lvl: <7 mg/dL — ABNORMAL LOW (ref 7.0–30.0)

## 2020-02-26 LAB — ACETAMINOPHEN LEVEL: Acetaminophen (Tylenol), Serum: <10 ug/mL — ABNORMAL LOW (ref 10–30)

## 2020-02-26 LAB — ETHANOL: Alcohol, Ethyl (B): <10 mg/dL (ref <10)

## 2020-02-26 MED ORDER — NALOXONE HCL 2 MG/2ML IJ SOSY
2.0000 mg | PREFILLED_SYRINGE | Freq: Once | INTRAMUSCULAR | Status: AC
  Administered 2020-02-26: 2 mg via INTRAVENOUS
  Filled 2020-02-26: qty 2

## 2020-02-26 MED ORDER — NALOXONE HCL 4 MG/10ML IJ SOLN
1.0000 mg/h | INTRAVENOUS | Status: DC
  Administered 2020-02-26: 1 mg/h via INTRAVENOUS
  Filled 2020-02-26: qty 4

## 2020-02-26 NOTE — Discharge Instructions (Signed)
DO NOT USE DRUGS THAT ARE NOT PRESCRIBED FOR YOU BY A MEDICAL PROVIDER!!  Drink plenty of fluids for the next several days.

## 2020-02-26 NOTE — ED Triage Notes (Signed)
Pt to er room number 4 via ems, per ems pt is here for an overdose, states that at 1440 pt was given 4mg  of narcan and started coming around. Pt to er, pt arouses to loud verbal stim.  Pt denies si or hi at this time, states that he has a lot of stress, but states that he wasn't trying to kill himself.  Pt states that he took a xanax, states that he took 1.5

## 2020-02-26 NOTE — ED Notes (Signed)
Pt continues to fall asleep but is easy to arouse. Narcan drip started per MD order. Will continue to monitor pt. VSS.

## 2020-02-26 NOTE — ED Provider Notes (Signed)
[facility] EMERGENCY DEPARTMENT Provider Note   CSN: [id number] Arrival date & time: 02/26/20  1510   History Chief Complaint  Patient presents with  . Ingestion    Nicholas Harrison is a 29 y.o. male.  The history is provided by the patient and the EMS personnel. The history is limited by the condition of the patient (Altered mental status).  Ingestion  He has history of attention deficit disorder, anxiety, depression, substance abuse and comes in after an apparent overdose.  He was found unresponsive.  Fire rescue gave a total of 4 mg of naloxone and did have to assist respirations.  He did wake up with this.  He admits to having taken 1.5 tablets of alprazolam, but does not know the strength of the tablet.  He denies other drug use and denies ethanol use.  Past Medical History:  Diagnosis Date  . Anxiety   . Depression   . Drug-induced seizure (HCC)   . Seizures [facility])     Patient Active Problem List   Diagnosis Date Noted  . 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  . Delirium 12/27/2018  . Seizure disorder (HCC) 07/24/2018  . 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 Polysubstance abuse (HCC) 10/19/2016  . Benzodiazepine dependence, episodic (HCC) 10/14/2016  . Moderate tetrahydrocannabinol (THC) dependence (HCC) 05/31/2016  . Opioid use disorder, severe, dependence (HCC) 10/29/2014  . ADD (attention deficit disorder) 02/15/2012  . Well adult exam 09/02/2011  . Rash 09/02/2011  . Chronic anxiety 06/05/2011    Past Surgical History:  Procedure Laterality Date  . HAND SURGERY         Family History  Problem Relation Age of Onset  . Depression Mother        anxiety  . Diabetes Maternal Grandfather     Social History   Tobacco Use  . Smoking status: Current Some Day Smoker  . Smokeless tobacco: Former Neurosurgeon    Types:  Chew  . Tobacco comment: only experimented  Substance Use Topics  . Alcohol use: Yes    Comment: occasionally   . Drug use: Yes    Types: Benzodiazepines, Oxycodone    Comment: pt reports he takes Oxycodone and Xanax, last used 2 weeks ago as of 07/18/18    Home Medications Prior to Admission medications   Medication Sig Start Date End Date Taking? Authorizing Provider  buprenorphine-naloxone (SUBOXONE) 8-2 mg SUBL SL tablet Place 1 tablet under the tongue 2 (two) times daily. Patient not taking: Reported on 09/09/2019 08/07/19   [provider]  busPIRone (BUSPAR) 15 MG tablet Take 15 mg by mouth 3 (three) times daily. Patient not taking: Reported on 09/09/2019 08/30/19   [provider]  FLUoxetine (PROZAC) 40 MG capsule Take 40 mg by mouth at bedtime. Patient not taking: Reported on 09/09/2019 09/02/19   [provider]  naloxone [facility]) nasal spray 4 mg/0.1 mL Place 0.4 mg into the nose once.  05/14/19   [provider]    Allergies    Buspar [buspirone] and Seroquel [quetiapine fumarate]  Review of Systems   Review of Systems  Unable to perform ROS: Mental status change    Physical Exam Updated Vital Signs BP (!) 151/96 (BP Location: Left Arm)   Pulse (!) 112   Temp 98.7 F (37.1 C) (Oral)   Resp 18   Ht 5\' 10"  (1.778 m)   Wt 81.6  kg   SpO2 93%   BMI 25.83 kg/m   Physical Exam Vitals and nursing note reviewed.   29 year old male, resting comfortably and in no acute distress. Vital signs are significant for elevated heart rate and blood pressure. Oxygen saturation is 93%, which is normal. Head is normocephalic and atraumatic. PERRLA, EOMI. Oropharynx is clear. Neck is nontender and supple without adenopathy or JVD. Back is nontender and there is no CVA tenderness. Lungs are clear without rales, wheezes, or rhonchi. Chest is nontender. Heart has regular rate and rhythm without murmur. Abdomen is soft, flat, nontender without masses  or hepatosplenomegaly and peristalsis is normoactive. Extremities have no cyanosis or edema, full range of motion is present. Skin is warm and dry without rash. Neurologic: Drowsy but arousable, and able to answer questions, cranial nerves are intact, there are no motor or sensory deficits.  ED Results / Procedures / Treatments   Labs (all labs ordered are listed, but only abnormal results are displayed) Labs Reviewed  ACETAMINOPHEN LEVEL - Abnormal; Notable for the following components:      Result Value   Acetaminophen (Tylenol), Serum <10 (*)    All other components within normal limits  COMPREHENSIVE METABOLIC PANEL - Abnormal; Notable for the following components:   CO2 20 (*)    Glucose, Bld 103 (*)    Creatinine, Ser 1.30 (*)    All other components within normal limits  SALICYLATE LEVEL - Abnormal; Notable for the following components:   Salicylate Lvl <7.0 (*)    All other components within normal limits  CBC WITH DIFFERENTIAL/PLATELET - Abnormal; Notable for the following components:   WBC 15.3 (*)    Neutro Abs 13.4 (*)    All other components within normal limits  RAPID URINE DRUG SCREEN, HOSP PERFORMED - Abnormal; Notable for the following components:   Opiates POSITIVE (*)    Benzodiazepines POSITIVE (*)    Tetrahydrocannabinol POSITIVE (*)    All other components within normal limits  RESP PANEL BY RT-PCR (FLU A&B, COVID) [id number]  ETHANOL    EKG EKG Interpretation  Date/Time:  Wednesday February 26 2020 15:21:46 EST Ventricular Rate:  109 PR Interval:    QRS Duration: 117 QT Interval:  346 QTC Calculation: 466 R Axis:   84 Text Interpretation: Sinus tachycardia Probable left atrial enlargement Incomplete right bundle branch block When compared with ECG of 09/09/2019, HEART RATE has increased QT has shortened Confirmed by Dione Booze ([id number]) on 02/26/2020 3:28:10 PM  Procedures Procedures  CRITICAL CARE Performed by: Dione Booze Total critical care time:  60 minutes Critical care time was exclusive of separately billable procedures and treating other patients. Critical care was necessary to treat or prevent imminent or life-threatening deterioration. Critical care was time spent personally by me on the following activities: development of treatment plan with patient and/or surrogate as well as nursing, discussions with consultants, evaluation of patient's response to treatment, examination of patient, obtaining history from patient or surrogate, ordering and performing treatments and interventions, ordering and review of laboratory studies, ordering and review of radiographic studies, pulse oximetry and re-evaluation of patient's condition.  Medications Ordered in ED Medications  naloxone (NARCAN) injection 2 mg (has no administration in time range)    ED Course  I have reviewed the triage vital signs and the nursing notes.  Pertinent lab results that were available during my care of the patient were reviewed by me and considered in my medical decision making (see chart for details).  MDM Rules/Calculators/A&P Apparent drug overdose.  Response to naloxone outside the hospital suggests an opiate was part of the overdose.  He is now getting more somnolent and starting to trend towards hypoxia and will be given additional naloxone.  He may need to be placed on a naloxone drip.  ECG shows no acute changes.  Old records are reviewed, and he has numerous ED visits and hospitalizations for substance abuse issues.  He had an excellent response to naloxone.  Given that he apparently took very longer acting opioid, he will be placed on a naloxone drip.  Per EMS, initial dose of naloxone was given at 1440.  If his overdose was fentanyl, he should not of needed any additional naloxone.  6:34 PM Drug screen is positive for opiates.  Labs are significant for mild elevation of creatinine.  He is maintaining adequate oxygen saturations on room air.  We will  discontinue naloxone drip and observe him for 1hour off of naloxone.  8:16 PM He is awake and alert and maintaining good oxygen saturations on room air.  He is felt to be safe for discharge.  He is given outpatient resources for drug rehab.  Given his mild increase in creatinine, he is encouraged to drink plenty of fluids for the next several days.  Final Clinical Impression(s) / ED Diagnoses Final diagnoses:  Accidental drug overdose, initial encounter  Renal insufficiency    Rx / DC Orders ED Discharge Orders    None       Dione Booze, MD 02/26/20 2016

## 2020-02-26 NOTE — ED Notes (Signed)
Pt in bed, blood drawn via 23 gauge butterfly from R hand, blood walked to the lab, replaced 4 L O2 via [place], pt satting well in mid 90s when awake; however, when pt falls alseep he desats to the high 80s

## 2020-12-22 ENCOUNTER — Inpatient Hospital Stay (HOSPITAL_COMMUNITY)
Admission: EM | Admit: 2020-12-22 | Discharge: 2020-12-25 | DRG: 897 | Disposition: A | Discharge status: Home / Self Care | Payer: BC Managed Care – PPO | Attending: Internal Medicine | Admitting: Internal Medicine

## 2020-12-22 ENCOUNTER — Encounter (HOSPITAL_COMMUNITY): Payer: Self-pay

## 2020-12-22 ENCOUNTER — Other Ambulatory Visit: Payer: Self-pay

## 2020-12-22 DIAGNOSIS — E860 Dehydration: Secondary | ICD-10-CM | POA: Diagnosis present

## 2020-12-22 DIAGNOSIS — F1123 Opioid dependence with withdrawal: Principal | ICD-10-CM | POA: Diagnosis present

## 2020-12-22 DIAGNOSIS — F32A Depression, unspecified: Secondary | ICD-10-CM | POA: Diagnosis present

## 2020-12-22 DIAGNOSIS — R6889 Other general symptoms and signs: Secondary | ICD-10-CM | POA: Diagnosis present

## 2020-12-22 DIAGNOSIS — F1120 Opioid dependence, uncomplicated: Secondary | ICD-10-CM | POA: Diagnosis present

## 2020-12-22 DIAGNOSIS — Z20822 Contact with and (suspected) exposure to covid-19: Secondary | ICD-10-CM | POA: Diagnosis present

## 2020-12-22 DIAGNOSIS — F1193 Opioid use, unspecified with withdrawal: Secondary | ICD-10-CM

## 2020-12-22 DIAGNOSIS — E871 Hypo-osmolality and hyponatremia: Secondary | ICD-10-CM | POA: Diagnosis present

## 2020-12-22 DIAGNOSIS — Z87891 Personal history of nicotine dependence: Secondary | ICD-10-CM

## 2020-12-22 DIAGNOSIS — Z79899 Other long term (current) drug therapy: Secondary | ICD-10-CM

## 2020-12-22 DIAGNOSIS — F909 Attention-deficit hyperactivity disorder, unspecified type: Secondary | ICD-10-CM | POA: Diagnosis present

## 2020-12-22 DIAGNOSIS — K219 Gastro-esophageal reflux disease without esophagitis: Secondary | ICD-10-CM | POA: Diagnosis present

## 2020-12-22 DIAGNOSIS — E876 Hypokalemia: Secondary | ICD-10-CM | POA: Diagnosis not present

## 2020-12-22 DIAGNOSIS — R9431 Abnormal electrocardiogram [ECG] [EKG]: Secondary | ICD-10-CM | POA: Diagnosis present

## 2020-12-22 DIAGNOSIS — Z818 Family history of other mental and behavioral disorders: Secondary | ICD-10-CM

## 2020-12-22 DIAGNOSIS — E861 Hypovolemia: Secondary | ICD-10-CM | POA: Diagnosis present

## 2020-12-22 DIAGNOSIS — N179 Acute kidney failure, unspecified: Secondary | ICD-10-CM | POA: Diagnosis present

## 2020-12-22 DIAGNOSIS — F419 Anxiety disorder, unspecified: Secondary | ICD-10-CM | POA: Diagnosis present

## 2020-12-22 DIAGNOSIS — F1220 Cannabis dependence, uncomplicated: Secondary | ICD-10-CM | POA: Diagnosis present

## 2020-12-22 DIAGNOSIS — Z888 Allergy status to other drugs, medicaments and biological substances status: Secondary | ICD-10-CM

## 2020-12-22 LAB — COMPREHENSIVE METABOLIC PANEL
ALT: 23 U/L (ref 0–44)
AST: 37 U/L (ref 15–41)
Albumin: 5.3 g/dL — ABNORMAL HIGH (ref 3.5–5.0)
Alkaline Phosphatase: 69 U/L (ref 38–126)
Anion gap: 23 — ABNORMAL HIGH (ref 5–15)
BUN: 44 mg/dL — ABNORMAL HIGH (ref 6–20)
CO2: 30 mmol/L (ref 22–32)
Calcium: 9.7 mg/dL (ref 8.9–10.3)
Chloride: 76 mmol/L — ABNORMAL LOW (ref 98–111)
Creatinine, Ser: 2.08 mg/dL — ABNORMAL HIGH (ref 0.61–1.24)
GFR, Estimated: 44 mL/min — ABNORMAL LOW (ref >60)
Glucose, Bld: 167 mg/dL — ABNORMAL HIGH (ref 70–99)
Potassium: 2 mmol/L — CL (ref 3.5–5.1)
Sodium: 129 mmol/L — ABNORMAL LOW (ref 135–145)
Total Bilirubin: 3.1 mg/dL — ABNORMAL HIGH (ref 0.3–1.2)
Total Protein: 8.5 g/dL — ABNORMAL HIGH (ref 6.5–8.1)

## 2020-12-22 LAB — CBC WITH DIFFERENTIAL/PLATELET
Abs Immature Granulocytes: 0.13 10*3/uL — ABNORMAL HIGH (ref 0.00–0.07)
Basophils Absolute: 0.1 10*3/uL (ref 0.0–0.1)
Basophils Relative: 1 %
Eosinophils Absolute: 0 10*3/uL (ref 0.0–0.5)
Eosinophils Relative: 0 %
HCT: 47 % (ref 39.0–52.0)
Hemoglobin: 17.2 g/dL — ABNORMAL HIGH (ref 13.0–17.0)
Immature Granulocytes: 1 %
Lymphocytes Relative: 7 %
Lymphs Abs: 1.1 10*3/uL (ref 0.7–4.0)
MCH: 29.7 pg (ref 26.0–34.0)
MCHC: 36.6 g/dL — ABNORMAL HIGH (ref 30.0–36.0)
MCV: 81 fL (ref 80.0–100.0)
Monocytes Absolute: 1.6 10*3/uL — ABNORMAL HIGH (ref 0.1–1.0)
Monocytes Relative: 10 %
Neutro Abs: 12.5 10*3/uL — ABNORMAL HIGH (ref 1.7–7.7)
Neutrophils Relative %: 81 %
Platelets: 465 10*3/uL — ABNORMAL HIGH (ref 150–400)
RBC: 5.8 MIL/uL (ref 4.22–5.81)
RDW: 11.8 % (ref 11.5–15.5)
WBC: 15.4 10*3/uL — ABNORMAL HIGH (ref 4.0–10.5)
nRBC: 0 % (ref 0.0–0.2)

## 2020-12-22 LAB — ETHANOL: Alcohol, Ethyl (B): <10 mg/dL (ref <10)

## 2020-12-22 LAB — CBG MONITORING, ED: Glucose-Capillary: 172 mg/dL — ABNORMAL HIGH (ref 70–99)

## 2020-12-22 MED ORDER — DICYCLOMINE HCL 20 MG PO TABS
20.0000 mg | ORAL_TABLET | Freq: Four times a day (QID) | ORAL | Status: DC | PRN
  Filled 2020-12-22: qty 1

## 2020-12-22 MED ORDER — HYDROXYZINE HCL 25 MG PO TABS
25.0000 mg | ORAL_TABLET | Freq: Four times a day (QID) | ORAL | Status: DC | PRN
  Administered 2020-12-23 – 2020-12-24 (×2): 25 mg via ORAL
  Filled 2020-12-22 (×2): qty 1

## 2020-12-22 MED ORDER — CLONIDINE HCL 0.1 MG PO TABS: 0.1000 mg | ORAL_TABLET | Freq: Every day | ORAL | Status: DC

## 2020-12-22 MED ORDER — CLONIDINE HCL 0.1 MG PO TABS
0.1000 mg | ORAL_TABLET | Freq: Four times a day (QID) | ORAL | Status: AC
  Administered 2020-12-23 – 2020-12-24 (×7): 0.1 mg via ORAL
  Filled 2020-12-22 (×7): qty 1

## 2020-12-22 MED ORDER — METHOCARBAMOL 500 MG PO TABS: 500.0000 mg | ORAL_TABLET | Freq: Three times a day (TID) | ORAL | Status: DC | PRN

## 2020-12-22 MED ORDER — NAPROXEN 250 MG PO TABS: 500.0000 mg | ORAL_TABLET | Freq: Two times a day (BID) | ORAL | Status: DC | PRN

## 2020-12-22 MED ORDER — ONDANSETRON 4 MG PO TBDP: 4.0000 mg | ORAL_TABLET | Freq: Four times a day (QID) | ORAL | Status: DC | PRN

## 2020-12-22 MED ORDER — CLONIDINE HCL 0.1 MG PO TABS
0.1000 mg | ORAL_TABLET | Freq: Two times a day (BID) | ORAL | Status: DC
  Administered 2020-12-24 – 2020-12-25 (×2): 0.1 mg via ORAL
  Filled 2020-12-22 (×2): qty 1

## 2020-12-22 MED ORDER — LOPERAMIDE HCL 2 MG PO CAPS
2.0000 mg | ORAL_CAPSULE | ORAL | Status: DC | PRN
  Administered 2020-12-24: 2 mg via ORAL
  Filled 2020-12-22: qty 1

## 2020-12-22 NOTE — ED Triage Notes (Signed)
Pt. States they are withdrawing from fentanyl. Pt. States the last time they used was Sunday.

## 2020-12-22 NOTE — ED Provider Notes (Signed)
Emergency Medicine Provider Triage Evaluation Note  Nicholas Harrison , a 30 y.o. male  was evaluated in triage.  Pt complains of flulike symptoms, after stopping using fentanyl, 3 days ago.  He also has had some vomiting and shortness of breath.  He denies using other illegal drugs.  He has previously been rehab and then given long-term antiopiate injection, Vivitrol.  He is here with his mother who helps to give history.  He would like to go into rehab but understands he will likely have to do that from the home setting.  His mother is going home and wants [place] to call her when he is ready to go home.  Review of Systems  Positive: Cramping, chills, achiness Negative: No shortness of breath, chest pain or paresthesia  Physical Exam  BP (!) 116/105 (BP Location: Right Arm)   Pulse (!) 108   Temp 98 F (36.7 C) (Oral)   Resp 18   Ht 5\' 9"  (1.753 m)   Wt 81.6 kg   SpO2 98%   BMI 26.58 kg/m  Gen:   Awake, somewhat tremulous Resp:  Normal effort  MSK:   Moves extremities without difficulty  Other:  He is lucid.  No dysarthria or aphasia  Medical Decision Making  Medically screening exam initiated at 9:41 PM.  Appropriate orders placed.  was informed that the remainder of the evaluation will be completed by another provider, this initial triage assessment does not replace that evaluation, and the importance of remaining in the ED until their evaluation is complete.  He is showing signs of mild opiate withdrawal at this time   Daisy Blossom, MD 12/22/20 2146

## 2020-12-23 DIAGNOSIS — N179 Acute kidney failure, unspecified: Secondary | ICD-10-CM | POA: Diagnosis present

## 2020-12-23 DIAGNOSIS — R6889 Other general symptoms and signs: Secondary | ICD-10-CM | POA: Diagnosis present

## 2020-12-23 DIAGNOSIS — F1120 Opioid dependence, uncomplicated: Secondary | ICD-10-CM

## 2020-12-23 DIAGNOSIS — Z888 Allergy status to other drugs, medicaments and biological substances status: Secondary | ICD-10-CM | POA: Diagnosis not present

## 2020-12-23 DIAGNOSIS — F909 Attention-deficit hyperactivity disorder, unspecified type: Secondary | ICD-10-CM | POA: Diagnosis present

## 2020-12-23 DIAGNOSIS — E861 Hypovolemia: Secondary | ICD-10-CM | POA: Diagnosis present

## 2020-12-23 DIAGNOSIS — Z818 Family history of other mental and behavioral disorders: Secondary | ICD-10-CM | POA: Diagnosis not present

## 2020-12-23 DIAGNOSIS — R9431 Abnormal electrocardiogram [ECG] [EKG]: Secondary | ICD-10-CM | POA: Diagnosis present

## 2020-12-23 DIAGNOSIS — E871 Hypo-osmolality and hyponatremia: Secondary | ICD-10-CM | POA: Diagnosis present

## 2020-12-23 DIAGNOSIS — K219 Gastro-esophageal reflux disease without esophagitis: Secondary | ICD-10-CM | POA: Diagnosis present

## 2020-12-23 DIAGNOSIS — E860 Dehydration: Secondary | ICD-10-CM | POA: Diagnosis present

## 2020-12-23 DIAGNOSIS — Z20822 Contact with and (suspected) exposure to covid-19: Secondary | ICD-10-CM | POA: Diagnosis present

## 2020-12-23 DIAGNOSIS — Z87891 Personal history of nicotine dependence: Secondary | ICD-10-CM | POA: Diagnosis not present

## 2020-12-23 DIAGNOSIS — E876 Hypokalemia: Secondary | ICD-10-CM | POA: Diagnosis present

## 2020-12-23 DIAGNOSIS — F419 Anxiety disorder, unspecified: Secondary | ICD-10-CM | POA: Diagnosis present

## 2020-12-23 DIAGNOSIS — F1220 Cannabis dependence, uncomplicated: Secondary | ICD-10-CM

## 2020-12-23 DIAGNOSIS — F1123 Opioid dependence with withdrawal: Secondary | ICD-10-CM | POA: Diagnosis present

## 2020-12-23 DIAGNOSIS — F32A Depression, unspecified: Secondary | ICD-10-CM | POA: Diagnosis present

## 2020-12-23 DIAGNOSIS — Z79899 Other long term (current) drug therapy: Secondary | ICD-10-CM | POA: Diagnosis not present

## 2020-12-23 LAB — COMPREHENSIVE METABOLIC PANEL
ALT: 18 U/L (ref 0–44)
AST: 24 U/L (ref 15–41)
Albumin: 4.4 g/dL (ref 3.5–5.0)
Alkaline Phosphatase: 57 U/L (ref 38–126)
Anion gap: 15 (ref 5–15)
BUN: 50 mg/dL — ABNORMAL HIGH (ref 6–20)
CO2: 37 mmol/L — ABNORMAL HIGH (ref 22–32)
Calcium: 8.4 mg/dL — ABNORMAL LOW (ref 8.9–10.3)
Chloride: 77 mmol/L — ABNORMAL LOW (ref 98–111)
Creatinine, Ser: 2.16 mg/dL — ABNORMAL HIGH (ref 0.61–1.24)
GFR, Estimated: 42 mL/min — ABNORMAL LOW (ref >60)
Glucose, Bld: 127 mg/dL — ABNORMAL HIGH (ref 70–99)
Potassium: 2.3 mmol/L — CL (ref 3.5–5.1)
Sodium: 129 mmol/L — ABNORMAL LOW (ref 135–145)
Total Bilirubin: 2.1 mg/dL — ABNORMAL HIGH (ref 0.3–1.2)
Total Protein: 7.2 g/dL (ref 6.5–8.1)

## 2020-12-23 LAB — CBC WITH DIFFERENTIAL/PLATELET
Abs Immature Granulocytes: 0.07 10*3/uL (ref 0.00–0.07)
Basophils Absolute: 0.1 10*3/uL (ref 0.0–0.1)
Basophils Relative: 0 %
Eosinophils Absolute: 0 10*3/uL (ref 0.0–0.5)
Eosinophils Relative: 0 %
HCT: 43.3 % (ref 39.0–52.0)
Hemoglobin: 15.4 g/dL (ref 13.0–17.0)
Immature Granulocytes: 1 %
Lymphocytes Relative: 14 %
Lymphs Abs: 1.8 10*3/uL (ref 0.7–4.0)
MCH: 29.6 pg (ref 26.0–34.0)
MCHC: 35.6 g/dL (ref 30.0–36.0)
MCV: 83.1 fL (ref 80.0–100.0)
Monocytes Absolute: 1.3 10*3/uL — ABNORMAL HIGH (ref 0.1–1.0)
Monocytes Relative: 10 %
Neutro Abs: 9.4 10*3/uL — ABNORMAL HIGH (ref 1.7–7.7)
Neutrophils Relative %: 75 %
Platelets: 322 10*3/uL (ref 150–400)
RBC: 5.21 MIL/uL (ref 4.22–5.81)
RDW: 12 % (ref 11.5–15.5)
WBC: 12.6 10*3/uL — ABNORMAL HIGH (ref 4.0–10.5)
nRBC: 0 % (ref 0.0–0.2)

## 2020-12-23 LAB — MAGNESIUM
Magnesium: 2.7 mg/dL — ABNORMAL HIGH (ref 1.7–2.4)
Magnesium: 2.4 mg/dL (ref 1.7–2.4)

## 2020-12-23 LAB — BASIC METABOLIC PANEL
Anion gap: 13 (ref 5–15)
BUN: 47 mg/dL — ABNORMAL HIGH (ref 6–20)
CO2: 36 mmol/L — ABNORMAL HIGH (ref 22–32)
Calcium: 8.3 mg/dL — ABNORMAL LOW (ref 8.9–10.3)
Chloride: 80 mmol/L — ABNORMAL LOW (ref 98–111)
Creatinine, Ser: 1.59 mg/dL — ABNORMAL HIGH (ref 0.61–1.24)
GFR, Estimated: >60 mL/min (ref >60)
Glucose, Bld: 135 mg/dL — ABNORMAL HIGH (ref 70–99)
Potassium: 2.9 mmol/L — ABNORMAL LOW (ref 3.5–5.1)
Sodium: 129 mmol/L — ABNORMAL LOW (ref 135–145)

## 2020-12-23 LAB — OSMOLALITY: Osmolality: 285 mOsm/kg (ref 275–295)

## 2020-12-23 LAB — RAPID URINE DRUG SCREEN, HOSP PERFORMED
Amphetamines: NOT DETECTED
Barbiturates: NOT DETECTED
Benzodiazepines: NOT DETECTED
Cocaine: NOT DETECTED
Opiates: NOT DETECTED
Tetrahydrocannabinol: POSITIVE — AB

## 2020-12-23 LAB — HIV ANTIBODY (ROUTINE TESTING W REFLEX): HIV Screen 4th Generation wRfx: NONREACTIVE

## 2020-12-23 LAB — RESP PANEL BY RT-PCR (FLU A&B, COVID) ARPGX2
Influenza A by PCR: NEGATIVE
Influenza B by PCR: NEGATIVE
SARS Coronavirus 2 by RT PCR: NEGATIVE

## 2020-12-23 MED ORDER — LORAZEPAM 1 MG PO TABS: 1.0000 mg | ORAL_TABLET | ORAL | Status: DC | PRN

## 2020-12-23 MED ORDER — LORAZEPAM 2 MG/ML IJ SOLN
1.0000 mg | Freq: Once | INTRAMUSCULAR | Status: AC
  Administered 2020-12-23: 1 mg via INTRAVENOUS
  Filled 2020-12-23: qty 1

## 2020-12-23 MED ORDER — BUSPIRONE HCL 5 MG PO TABS
10.0000 mg | ORAL_TABLET | Freq: Two times a day (BID) | ORAL | Status: DC
  Administered 2020-12-23 – 2020-12-25 (×6): 10 mg via ORAL
  Filled 2020-12-23 (×6): qty 2

## 2020-12-23 MED ORDER — HEPARIN SODIUM (PORCINE) 5000 UNIT/ML IJ SOLN: 5000.0000 [IU] | Freq: Three times a day (TID) | INTRAMUSCULAR | Status: DC

## 2020-12-23 MED ORDER — METHOCARBAMOL 1000 MG/10ML IJ SOLN
500.0000 mg | Freq: Four times a day (QID) | INTRAVENOUS | Status: DC | PRN
  Filled 2020-12-23: qty 5

## 2020-12-23 MED ORDER — HEPARIN SODIUM (PORCINE) 5000 UNIT/ML IJ SOLN
5000.0000 [IU] | Freq: Three times a day (TID) | INTRAMUSCULAR | Status: DC
  Administered 2020-12-23 – 2020-12-25 (×7): 5000 [IU] via SUBCUTANEOUS
  Filled 2020-12-23 (×7): qty 1

## 2020-12-23 MED ORDER — PROCHLORPERAZINE EDISYLATE 10 MG/2ML IJ SOLN: 10.0000 mg | Freq: Four times a day (QID) | INTRAMUSCULAR | Status: DC | PRN

## 2020-12-23 MED ORDER — POTASSIUM CHLORIDE CRYS ER 20 MEQ PO TBCR
40.0000 meq | EXTENDED_RELEASE_TABLET | Freq: Once | ORAL | Status: AC
  Administered 2020-12-23: 40 meq via ORAL
  Filled 2020-12-23: qty 2

## 2020-12-23 MED ORDER — FOLIC ACID 1 MG PO TABS
1.0000 mg | ORAL_TABLET | Freq: Every day | ORAL | Status: DC
  Administered 2020-12-23 – 2020-12-25 (×3): 1 mg via ORAL
  Filled 2020-12-23 (×3): qty 1

## 2020-12-23 MED ORDER — THIAMINE HCL 100 MG PO TABS
100.0000 mg | ORAL_TABLET | Freq: Every day | ORAL | Status: DC
  Administered 2020-12-23 – 2020-12-25 (×3): 100 mg via ORAL
  Filled 2020-12-23 (×3): qty 1

## 2020-12-23 MED ORDER — THIAMINE HCL 100 MG/ML IJ SOLN
100.0000 mg | Freq: Every day | INTRAMUSCULAR | Status: DC
  Filled 2020-12-23: qty 2

## 2020-12-23 MED ORDER — LORAZEPAM 1 MG PO TABS
1.0000 mg | ORAL_TABLET | ORAL | Status: DC | PRN
  Administered 2020-12-23 – 2020-12-24 (×3): 1 mg via ORAL
  Filled 2020-12-23 (×3): qty 1

## 2020-12-23 MED ORDER — ONDANSETRON HCL 4 MG/2ML IJ SOLN
4.0000 mg | Freq: Four times a day (QID) | INTRAMUSCULAR | Status: DC | PRN
Start: 2020-12-23 — End: 2020-12-23

## 2020-12-23 MED ORDER — SODIUM CHLORIDE 0.9 % IV BOLUS
1000.0000 mL | Freq: Once | INTRAVENOUS | Status: AC
  Administered 2020-12-23: 1000 mL via INTRAVENOUS

## 2020-12-23 MED ORDER — POTASSIUM CHLORIDE IN NACL 40-0.9 MEQ/L-% IV SOLN
INTRAVENOUS | Status: DC
  Administered 2020-12-23: 125 mL/h via INTRAVENOUS
  Filled 2020-12-23 (×4): qty 1000

## 2020-12-23 MED ORDER — ADULT MULTIVITAMIN W/MINERALS CH
1.0000 | ORAL_TABLET | Freq: Every day | ORAL | Status: DC
  Administered 2020-12-23 – 2020-12-25 (×3): 1 via ORAL
  Filled 2020-12-23 (×4): qty 1

## 2020-12-23 MED ORDER — ACETAMINOPHEN 325 MG PO TABS: 650.0000 mg | ORAL_TABLET | Freq: Four times a day (QID) | ORAL | Status: DC | PRN

## 2020-12-23 MED ORDER — ONDANSETRON HCL 4 MG PO TABS: 4.0000 mg | ORAL_TABLET | Freq: Four times a day (QID) | ORAL | Status: DC | PRN

## 2020-12-23 MED ORDER — KETOROLAC TROMETHAMINE 30 MG/ML IJ SOLN: 30.0000 mg | Freq: Four times a day (QID) | INTRAMUSCULAR | Status: DC | PRN

## 2020-12-23 MED ORDER — LORAZEPAM 2 MG/ML IJ SOLN: 1.0000 mg | INTRAMUSCULAR | Status: DC | PRN

## 2020-12-23 MED ORDER — POTASSIUM CHLORIDE CRYS ER 20 MEQ PO TBCR
60.0000 meq | EXTENDED_RELEASE_TABLET | Freq: Once | ORAL | Status: AC
  Administered 2020-12-23: 60 meq via ORAL
  Filled 2020-12-23: qty 3

## 2020-12-23 MED ORDER — MAGNESIUM SULFATE 2 GM/50ML IV SOLN
2.0000 g | Freq: Once | INTRAVENOUS | Status: AC
  Administered 2020-12-23: 2 g via INTRAVENOUS
  Filled 2020-12-23: qty 50

## 2020-12-23 MED ORDER — POTASSIUM CHLORIDE 10 MEQ/100ML IV SOLN
10.0000 meq | Freq: Once | INTRAVENOUS | Status: AC
  Administered 2020-12-23: 10 meq via INTRAVENOUS
  Filled 2020-12-23: qty 100

## 2020-12-23 MED ORDER — ACETAMINOPHEN 650 MG RE SUPP: 650.0000 mg | Freq: Four times a day (QID) | RECTAL | Status: DC | PRN

## 2020-12-23 NOTE — H&P (Signed)
TRH H&P    Patient Demographics:    Nicholas Harrison, is a 30 y.o. male  MRN: [id number]  DOB - 05/21/1990  Admit Date - 12/22/2020  Referring MD/NP/PA: Judd Lien  Outpatient Primary MD for the patient is Plotnikov, Georgina Quint, MD  Patient coming from: home  Chief complaint-withdrawals   HPI:    Nicholas Harrison  is a 30 y.o. male, with history of anxiety, depression, drug-induced seizure, substance abuse, presents to the ED with chief complaint of withdrawal.  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  Patient reports that he quit cold [place], with the plan to go to rehab.  Patient reports he has had nausea and vomiting over the past couple days.  He has not had any abdominal pain or diarrhea.  He has been agitated and shaky and also had generalized weakness.  No hallucinations.  Last normal meal was 5 days ago.  Last normal bowel movement was 2 days ago.  He does have a history of intermittent constipation secondary to opiate use.  He has noticed a decrease in urine output, and darker urine than normal.  He is also had rhinorrhea, but no cough.  Patient has no other complaints at this time.  Patient does not smoke cigarettes, does not drink alcohol, he does smoke marijuana, use fentanyl, and Xanax.  His last Xanax use was 2 months ago. Patient is vaccinated for COVID.  Patient is full code.  In the ED Temp 98, heart rate 108-129, respiratory rate 18-19, blood pressure 150/118, maintaining saturations on room air Patient had a leukocytosis of 15.4, hemoglobin 17.2-worth noting that patient normally has a leukocytosis on his blood work Automotive engineer reveals a hyponatremia at 129 hypokalemia at 2.0, hypochloremia at 76, elevated BUN at 44, elevated creatinine 2.08 Alcohol level less than 10 Clonidine taper, Bentyl, hydroxyzine, Imodium, Ativan, Robaxin, naproxen, Zofran, 1 L normal saline, 50 mEq of  potassium given in the ED Admission requested for management of hypokalemia and AKI    Review of systems:    In addition to the HPI above,  No Fever-chills, No Headache, No changes with Vision or hearing, No problems swallowing food or Liquids, No Chest pain, Cough or Shortness of Breath, No Abdominal pain,  No Blood in stool or Urine, No dysuria, admits to decrease in urine output No new skin rashes or bruises, No new joints pains-aches,  No new weakness, tingling, numbness in any extremity, No recent weight gain or loss, No polyuria, polydypsia or polyphagia, No significant Mental Stressors.  All other systems reviewed and are negative.    Past History of the following :    Past Medical History:  Diagnosis Date   Anxiety    Depression    Drug-induced seizure (HCC)    Seizures (HCC)       Past Surgical History:  Procedure Laterality Date   HAND SURGERY        Social History:      Social History   Tobacco Use   Smoking status: Former  Smokeless tobacco: Former    Types: Chew  Substance Use Topics   Alcohol use: Not Currently       Family History :     Family History  Problem Relation Age of Onset   Depression Mother        anxiety   Diabetes Maternal Grandfather       Home Medications:   Prior to Admission medications   Medication Sig Start Date End Date Taking? Authorizing Provider  busPIRone (BUSPAR) 10 MG tablet Take by mouth. 12/16/20 12/16/21 Yes [provider]  naloxone (NARCAN) nasal spray 4 mg/0.1 mL Place 0.4 mg into the nose once.  05/14/19  Yes [provider]  QUEtiapine (SEROQUEL) 100 MG tablet Take by mouth. 01/27/20  Yes [provider]  busPIRone (BUSPAR) 10 MG tablet Take 10 mg by mouth 2 (two) times daily. 12/11/19   [provider]  doxycycline (VIBRA-TABS) 100 MG tablet Take 100 mg by mouth 2 (two) times daily. Patient not taking: Reported on 12/22/2020 01/07/20   [provider]   propranolol (INDERAL) 20 MG tablet Take 20 mg by mouth 3 (three) times daily. Patient not taking: Reported on 12/22/2020 01/27/20   [provider]  sertraline (ZOLOFT) 50 MG tablet Take 50 mg by mouth daily. 01/27/20   [provider]     Allergies:     Allergies  Allergen Reactions   Buspar [Buspirone] Nausea Only    Dizziness (also) The patient does not think it was problem.  He is willing to try it again   Seroquel [Quetiapine Fumarate]     Sleepy, vivid dreams     Physical Exam:   Vitals  Blood pressure 104/83, pulse 97, temperature 98 F (36.7 C), temperature source Oral, resp. rate (!) 22, height 5\' 9"  (1.753 m), weight 81.6 kg, SpO2 100 %.  1.  General: Patient lying supine in bed,  no acute distress   2. Psychiatric: Alert and oriented x 3, mood and behavior normal for situation, pleasant and cooperative with exam   3. Neurologic: Speech and language are normal, face is symmetric, moves all 4 extremities voluntarily, at baseline without acute deficits on limited exam   4. HEENMT:  Head is atraumatic, normocephalic, pupils reactive to light, neck is supple, trachea is midline, mucous membranes are moist   5. Respiratory : Lungs are clear to auscultation bilaterally without wheezing, rhonchi, rales, no cyanosis, no increase in work of breathing or accessory muscle use   6. Cardiovascular : Heart rate normal, rhythm is regular, no murmurs, rubs or gallops, no peripheral edema, peripheral pulses palpated   7. Gastrointestinal:  Abdomen is soft, nondistended, nontender to palpation bowel sounds active, no masses or organomegaly palpated   8. Skin:  Skin is warm, dry and intact without rashes, acute lesions, or ulcers on limited exam   9.Musculoskeletal:  No acute deformities or trauma, no asymmetry in tone, no peripheral edema, peripheral pulses palpated, no tenderness to palpation in the extremities     Data Review:    CBC Recent Labs   Lab 12/22/20 2206  WBC 15.4*  HGB 17.2*  HCT 47.0  PLT 465*  MCV 81.0  MCH 29.7  MCHC 36.6*  RDW 11.8  LYMPHSABS 1.1  MONOABS 1.6*  EOSABS 0.0  BASOSABS 0.1   ------------------------------------------------------------------------------------------------------------------  Results for orders placed or performed during the hospital encounter of 12/22/20 (from the past 48 hour(s))  Comprehensive metabolic panel     Status: Abnormal   Collection  Time: 12/22/20 10:06 PM  Result Value Ref Range   Sodium 129 (L) 135 - 145 mmol/L   Potassium 2.0 (LL) 3.5 - 5.1 mmol/L    Comment: CRITICAL RESULT CALLED TO, READ BACK BY AND VERIFIED WITH: BELTON,K ON 12/22/20 AT 2315 BY LOY,C    Chloride 76 (L) 98 - 111 mmol/L   CO2 30 22 - 32 mmol/L   Glucose, Bld 167 (H) 70 - 99 mg/dL    Comment: Glucose reference range applies only to samples taken after fasting for at least 8 hours.   BUN 44 (H) 6 - 20 mg/dL   Creatinine, Ser 3.41 (H) 0.61 - 1.24 mg/dL   Calcium 9.7 8.9 - 93.7 mg/dL   Total Protein 8.5 (H) 6.5 - 8.1 g/dL   Albumin 5.3 (H) 3.5 - 5.0 g/dL   AST 37 15 - 41 U/L   ALT 23 0 - 44 U/L   Alkaline Phosphatase 69 38 - 126 U/L   Total Bilirubin 3.1 (H) 0.3 - 1.2 mg/dL   GFR, Estimated 44 (L) >60 mL/min    Comment: (NOTE) Calculated using the CKD-EPI Creatinine Equation (2021)    Anion gap 23 (H) 5 - 15    Comment: Electrolytes repeated to confirm. Performed at [facility], [address], [place], [place] [address]   Ethanol     Status: None   Collection Time: 12/22/20 10:06 PM  Result Value Ref Range   Alcohol, Ethyl (B) <10 <10 mg/dL    Comment: (NOTE) Lowest detectable limit for serum alcohol is 10 mg/dL.  For medical purposes only. Performed at [facility], [address], [place], [place] [address]   CBC with Differential     Status: Abnormal   Collection Time: 12/22/20 10:06 PM  Result Value Ref Range   WBC 15.4 (H) 4.0 - 10.5 K/uL   RBC 5.80 4.22 - 5.81 MIL/uL    Hemoglobin 17.2 (H) 13.0 - 17.0 g/dL   HCT 29.9 24.2 - 68.3 %   MCV 81.0 80.0 - 100.0 fL   MCH 29.7 26.0 - 34.0 pg   MCHC 36.6 (H) 30.0 - 36.0 g/dL   RDW 41.9 62.2 - 29.7 %   Platelets 465 (H) 150 - 400 K/uL   nRBC 0.0 0.0 - 0.2 %   Neutrophils Relative % 81 %   Neutro Abs 12.5 (H) 1.7 - 7.7 K/uL   Lymphocytes Relative 7 %   Lymphs Abs 1.1 0.7 - 4.0 K/uL   Monocytes Relative 10 %   Monocytes Absolute 1.6 (H) 0.1 - 1.0 K/uL   Eosinophils Relative 0 %   Eosinophils Absolute 0.0 0.0 - 0.5 K/uL   Basophils Relative 1 %   Basophils Absolute 0.1 0.0 - 0.1 K/uL   Immature Granulocytes 1 %   Abs Immature Granulocytes 0.13 (H) 0.00 - 0.07 K/uL    Comment: Performed at [facility], [address], [place], [place] [address]  Magnesium     Status: Abnormal   Collection Time: 12/22/20 10:06 PM  Result Value Ref Range   Magnesium 2.7 (H) 1.7 - 2.4 mg/dL    Comment: Performed at [facility], [address] [place], [place] [address]  CBG monitoring, ED     Status: Abnormal   Collection Time: 12/22/20 10:11 PM  Result Value Ref Range   Glucose-Capillary 172 (H) 70 - 99 mg/dL    Comment: Glucose reference range applies only to samples taken after fasting for at least 8 hours.  Urine rapid drug  screen (hosp performed)     Status: Abnormal   Collection Time: 12/23/20 12:23 AM  Result Value Ref Range   Opiates NONE DETECTED NONE DETECTED   Cocaine NONE DETECTED NONE DETECTED   Benzodiazepines NONE DETECTED NONE DETECTED   Amphetamines NONE DETECTED NONE DETECTED   Tetrahydrocannabinol POSITIVE (A) NONE DETECTED   Barbiturates NONE DETECTED NONE DETECTED    Comment: (NOTE) DRUG SCREEN FOR MEDICAL PURPOSES ONLY.  IF CONFIRMATION IS NEEDED FOR ANY PURPOSE, NOTIFY LAB WITHIN 5 DAYS.  LOWEST DETECTABLE LIMITS FOR URINE DRUG SCREEN Drug Class                     Cutoff (ng/mL) Amphetamine and metabolites    1000 Barbiturate and metabolites    200 Benzodiazepine                  200 Tricyclics and metabolites     300 Opiates and metabolites        300 Cocaine and metabolites        300 THC                            50 Performed at [facility], [address] [place], [place] [address]   Resp Panel by RT-PCR (Flu A&B, Covid) Nasopharyngeal Swab     Status: None   Collection Time: 12/23/20 12:23 AM   Specimen: Nasopharyngeal Swab; Nasopharyngeal(NP) swabs in vial transport medium  Result Value Ref Range   SARS Coronavirus 2 by RT PCR NEGATIVE NEGATIVE    Comment: (NOTE) SARS-CoV-2 target nucleic acids are NOT DETECTED.  The SARS-CoV-2 RNA is generally detectable in upper respiratory specimens during the acute phase of infection. The lowest concentration of SARS-CoV-2 viral copies this assay can detect is 138 copies/mL. A negative result does not preclude SARS-Cov-2 infection and should not be used as the sole basis for treatment or other patient management decisions. A negative result may occur with  improper specimen collection/handling, submission of specimen other than nasopharyngeal swab, presence of viral mutation(s) within the areas targeted by this assay, and inadequate number of viral copies(<138 copies/mL). A negative result must be combined with clinical observations, patient history, and epidemiological information. The expected result is Negative.  Fact Sheet for Patients:  BloggerCourse.com  Fact Sheet for Healthcare Providers:  SeriousBroker.it  This test is no t yet approved or cleared by the [place] FDA and  has been authorized for detection and/or diagnosis of SARS-CoV-2 by FDA under an Emergency Use Authorization (EUA). This EUA will remain  in effect (meaning this test can be used) for the duration of the COVID-19 declaration under Section 564(b)(1) of the Act, 21 U.S.C.section 360bbb-3(b)(1), unless the authorization is terminated  or revoked sooner.       Influenza A by  PCR NEGATIVE NEGATIVE   Influenza B by PCR NEGATIVE NEGATIVE    Comment: (NOTE) The Xpert Xpress SARS-CoV-2/FLU/RSV plus assay is intended as an aid in the diagnosis of influenza from Nasopharyngeal swab specimens and should not be used as a sole basis for treatment. Nasal washings and aspirates are unacceptable for Xpert Xpress SARS-CoV-2/FLU/RSV testing.  Fact Sheet for Patients: BloggerCourse.com  Fact Sheet for Healthcare Providers: SeriousBroker.it  This test is not yet approved or cleared by the [place] FDA and has been authorized for detection and/or diagnosis of SARS-CoV-2 by FDA under an Emergency Use Authorization (EUA). This EUA will remain in effect (meaning  this test can be used) for the duration of the COVID-19 declaration under Section 564(b)(1) of the Act, 21 U.S.C. section 360bbb-3(b)(1), unless the authorization is terminated or revoked.  Performed at [facility], [address] [place], [place] [address]     Chemistries  Recent Labs  Lab 12/22/20 2206  NA 129*  K 2.0*  CL 76*  CO2 30  GLUCOSE 167*  BUN 44*  CREATININE 2.08*  CALCIUM 9.7  MG 2.7*  AST 37  ALT 23  ALKPHOS 69  BILITOT 3.1*   ------------------------------------------------------------------------------------------------------------------  ------------------------------------------------------------------------------------------------------------------ GFR: Estimated Creatinine Clearance: 52.9 mL/min (A) (by C-G formula based on SCr of 2.08 mg/dL (H)). Liver Function Tests: Recent Labs  Lab 12/22/20 2206  AST 37  ALT 23  ALKPHOS 69  BILITOT 3.1*  PROT 8.5*  ALBUMIN 5.3*   No results for input(s): LIPASE, AMYLASE in the last 168 hours. No results for input(s): AMMONIA in the last 168 hours. Coagulation Profile: No results for input(s): INR, PROTIME in the last 168 hours. Cardiac Enzymes: No results for input(s):  CKTOTAL, CKMB, CKMBINDEX, TROPONINI in the last 168 hours. BNP (last 3 results) No results for input(s): PROBNP in the last 8760 hours. HbA1C: No results for input(s): HGBA1C in the last 72 hours. CBG: Recent Labs  Lab 12/22/20 2211  GLUCAP 172*   Lipid Profile: No results for input(s): CHOL, HDL, LDLCALC, TRIG, CHOLHDL, LDLDIRECT in the last 72 hours. Thyroid Function Tests: No results for input(s): TSH, T4TOTAL, FREET4, T3FREE, THYROIDAB in the last 72 hours. Anemia Panel: No results for input(s): VITAMINB12, FOLATE, FERRITIN, TIBC, IRON, RETICCTPCT in the last 72 hours.  --------------------------------------------------------------------------------------------------------------- Urine analysis:    Component Value Date/Time   COLORURINE YELLOW 12/20/2018 1706   APPEARANCEUR CLEAR 12/20/2018 1706   LABSPEC >=1.030 (A) 12/20/2018 1706   PHURINE 6.0 12/20/2018 1706   GLUCOSEU NEGATIVE 12/20/2018 1706   HGBUR NEGATIVE 12/20/2018 1706   BILIRUBINUR SMALL (A) 12/20/2018 1706   KETONESUR NEGATIVE 12/20/2018 1706   PROTEINUR 30 (A) 07/18/2018 1703   UROBILINOGEN 0.2 12/20/2018 1706   NITRITE NEGATIVE 12/20/2018 1706   LEUKOCYTESUR NEGATIVE 12/20/2018 1706      Imaging Results:    No results found.  My personal review of EKG: Rhythm NSR, Rate 95 /min, QTc 598, diffuse T wave inversions   Assessment & Plan:    Active Problems:   Chronic anxiety   Opioid use disorder, severe, dependence (HCC)   Withdrawal complaint   Prolonged QT interval   Withdrawal Monitor COWS score As needed Ativan Supportive care and symptom control Continue clonidine Opioid use disorder severe dependence Patient is planning to go to rehab after admission Supportive care as above, avoid opiates when possible Continue clonidine Hypokalemia Potassium 2.0 Secondary to GI losses 50 mEq given in the ED Continue normal saline with 40 mEq potassium IV fluids Recheck in the a.m. Check  magnesium in a.m. AKI Baseline creatinine 1.3 Today the creatinine is 2.08 Secondary to dehydration secondary to GI losses Continue to monitor Anxiety Continue BuSpar and as needed Ativan Hypovolemic hyponatremia Sodium 129 Continue IV fluids Trend in the a.m.   DVT Prophylaxis-    - SCDs   AM Labs Ordered, also please review Full Orders  Family Communication: No family at bedside  Code Status: Full  Admission status: Observation Time spent in minutes : 65   Lygia Olaes B Zierle-Ghosh DO

## 2020-12-23 NOTE — BH Assessment (Signed)
Comprehensive Clinical Assessment (CCA) Note  12/23/2020 Nicholas Harrison [id number]  Disposition: [place] Abts, PA, psych cleared. TTS faxed resources for discharge, including medication management, substance usage treatment and outpatient therapy. Patient and mother, Giannis Corpuz agreed that she would take patient to Kathlen Brunswick at [facility] for his [employer] once patient is discharged from [facility]. Mother has agreed to pick patient up from [facility] this AM. Wilkie Aye, RN, informed of disposition.  The patient demonstrates the following risk factors for suicide: Chronic risk factors for suicide include: substance use disorder. Acute risk factors for suicide include: N/A. Protective factors for this patient include: positive social support, coping skills, and hope for the future. Considering these factors, the overall suicide risk at this point appears to be low. Patient is appropriate for outpatient follow up.  Flowsheet Row ED from 12/22/2020 in [place] EMERGENCY DEPARTMENT Admission ([facility]) [facility] (Discharged[facility]  C-SSRS RISK CATEGORY No Risk No Risk No Risk      Nicholas Harrison is a 30 year old male presenting voluntary to [facility] due to opioid withdrawals. Patient denied SI, HI and psychosis. Patient reported last usage of fentyl was 2 days ago. Patient reported his plan for recovery included be medically cleared, then discharged so his mother can take him to see Kathlen Brunswick at [facility] so that he could restart his Vivatrol shot. Patient reported his last Vivatrol shot was 1 year ago. Patient reported worsening depressive symptoms because he relapsed. Patient reported taking 5 fentyl pills daily for almost a year. Patient last psych hospitalization was 2020 for SI. Patient reported being at [facility] 1 year ago for 90 days. Patient denied  suicide attempts and self-harming behaviors.   Patient resides with grandfather. Patient is currently employed as a Garment/textile technologist. Patient denied any work-related stressors. Patient reported good relationship with mother. Patient was cooperative during assessment.   Collateral contact Leighton Brickley, mother, [phone]. Patient gave consent to speak with mother. Mother reported same plan as patient. Mother stated she would pick patient up in the morning and take him to see Kathlen Brunswick at [facility] so he can restart his Vivatrol. Mother reported that patient has not displayed or reported any SI, HI or psychosis. Mother contracted for safety and agreed to patient to [facility]. Mother reported no safety concerns.  Chief Complaint:  Chief Complaint  Patient presents with   Withdrawal   Visit Diagnosis: Opioid Dependence  CCA Screening Triage Referral Assessment Type of Contact: Tele-Assessment  Telemedicine Service Delivery:   Is this Initial or Reassessment? Initial Assessment  Date Telepsych consult ordered in [facility]:  12/22/20  Time Telepsych consult ordered in [facility]:  2144  Location of Assessment: [facility]  Provider Location: [facility]  Collateral Involvement: Daoud Lobue, mother, [phone]  Does Patient Have a Court Appointed Legal Guardian? No data recorded Name and Contact of Legal Guardian: No data recorded If Minor and Not Living with Parent(s), Who has Custody? No data recorded Is CPS involved or ever been involved? Never  Is APS involved or ever been involved? Never  Patient Determined To Be At Risk for Harm To Self or Others Based on Review of Patient Reported Information or Presenting Complaint? No  Method: No data recorded Availability of Means: No data recorded Intent: No data recorded Notification Required: No data recorded Additional Information for Danger to  Others Potential: No data recorded Additional Comments  for Danger to Others Potential: No data recorded Are There Guns or Other Weapons in Your Home? No data recorded Types of Guns/Weapons: No data recorded Are These Weapons Safely Secured?                            No data recorded Who Could Verify You Are Able To Have These Secured: No data recorded Do You Have any Outstanding Charges, Pending Court Dates, Parole/Probation? No data recorded Contacted To Inform of Risk of Harm To Self or Others: No data recorded  Does Patient Present under Involuntary Commitment? No  IVC Papers Initial File Date: No data recorded  [place] of Residence: [place]  Patient Currently Receiving the Following Services: Not Receiving Services  Determination of Need: Routine (7 days)  Options For Referral: Chemical Dependency Intensive Outpatient Therapy (CDIOP)  CCA Biopsychosocial Patient Reported Schizophrenia/Schizoaffective Diagnosis in Past: No data recorded  Strengths: Self-awareness and self-motivated, has family support, has had previous treatment experience, he is ready and wants to make changes in his life.  Mental Health Symptoms Depression:   Hopelessness; Worthlessness; Change in energy/activity; Fatigue; Increase/decrease in appetite   Duration of Depressive symptoms:  Duration of Depressive Symptoms: Less than two weeks   Mania:   None   Anxiety:    Tension; Worrying; Fatigue   Psychosis:   None   Duration of Psychotic symptoms:    Trauma:   None   Obsessions:   None   Compulsions:   None   Inattention:   None   Hyperactivity/Impulsivity:   None   Oppositional/Defiant Behaviors:   None   Emotional Irregularity:   None   Other Mood/Personality Symptoms:   Patient reports he has struggled with anxiety since he was in high school -long before he began using drugs. He desscribed it as 'social anxiety' and experiencing mini-panic attacks. he is currently prescribed Lexapro by his PCP, Alex Plotnikov at [facility].     Mental Status Exam Appearance and self-care  Stature:   Average   Weight:   Average weight   Clothing:   Age-appropriate   Grooming:   Normal   Cosmetic use:   None   Posture/gait:   Normal   Motor activity:   Not Remarkable   Sensorium  Attention:   Normal   Concentration:   Normal   Orientation:   X5   Recall/memory:   Normal   Affect and Mood  Affect:   Appropriate   Mood:   Hopeless; Worthless   Relating  Eye contact:   Normal   Facial expression:   Responsive   Attitude toward examiner:   Cooperative   Thought and Language  Speech flow:  Normal   Thought content:   Appropriate to Mood and Circumstances   Preoccupation:   None   Hallucinations:   None   Organization:  No data recorded  [employer] of Knowledge:   Average   Intelligence:   Average   Abstraction:   Normal   Judgement:   Fair   Dance movement psychotherapist:   Realistic   Insight:   Fair   Decision Making:   Impulsive   Social Functioning  Social Maturity:   Impulsive   Social Judgement:   Normal   Stress  Stressors:  No data recorded  Coping Ability:   Overwhelmed   Skill Deficits:   Decision making   Supports:  Family; Friends/Service system    Religion: Religion/Spirituality Are You A Religious Person?: Yes How Might This Affect Treatment?: I might help me with AA  Leisure/Recreation: Leisure / Recreation Do You Have Hobbies?: Yes Leisure and Hobbies: playing golf, racing go-carts and spending time with friends  Exercise/Diet: Exercise/Diet Do You Exercise?: No Have You Gained or Lost A Significant Amount of Weight in the Past Six Months?: No Do You Follow a Special Diet?:  (uta) Do You Have Any Trouble Sleeping?: No  CCA Employment/Education Employment/Work Situation: Employment / Work Situation Employment Situation: Employed Work Stressors: none Patient's Job has Been Impacted by Current Illness: Yes Has  Patient ever Been in Equities trader?: No  Education: Education Last Grade Completed: 14 Did You Product manager?: Yes Did You Have An Individualized Education Program (IIEP): No Did You Have Any Difficulty At [employer]?: No  CCA Family/Childhood History Family and Relationship History: Family history Does patient have children?: No  Childhood History:  Childhood History By whom was/is the patient raised?: Both parents Did patient suffer any verbal/emotional/physical/sexual abuse as a child?: No Did patient suffer from severe childhood neglect?: No Has patient ever been sexually abused/assaulted/raped as an adolescent or adult?: No Witnessed domestic violence?: No Has patient been affected by domestic violence as an adult?: No  Child/Adolescent Assessment:   CCA Substance Use Alcohol/Drug Use: Alcohol / Drug Use Pain Medications: see MAR Prescriptions: see MAR Over the Counter: see MAR History of alcohol / drug use?: Yes Longest period of sobriety (when/how long): Patient reports his longest period of sobriety is 2 months. He stayed clean for 60 days after discharge from [facility] in June of 2017. Negative Consequences of Use: Financial, Personal relationships, Work / School Withdrawal Symptoms: Sweats, Fever / Chills   ASAM's:  Six Dimensions of Multidimensional Assessment  Dimension 1:  Acute Intoxication and/or Withdrawal Potential:      Dimension 2:  Biomedical Conditions and Complications:      Dimension 3:  Emotional, Behavioral, or Cognitive Conditions and Complications:     Dimension 4:  Readiness to Change:     Dimension 5:  Relapse, Continued use, or Continued Problem Potential:     Dimension 6:  Recovery/Living Environment:     ASAM Severity Score:    ASAM Recommended Level of Treatment: ASAM Recommended Level of Treatment: Level II Partial Hospitalization Treatment   Substance use Disorder (SUD) Substance Use Disorder (SUD)  Checklist Symptoms of  Substance Use: Evidence of withdrawal (Comment), Large amounts of time spent to obtain, use or recover from the substance(s), Substance(s) often taken in larger amounts or over longer times than was intended  Recommendations for Services/Supports/Treatments: Recommendations for Services/Supports/Treatments Recommendations For Services/Supports/Treatments: CD-IOP Intensive Chemical Dependency Program  Discharge Disposition:   DSM5 Diagnoses: Patient Active Problem List   Diagnosis Date Noted   Withdrawal complaint 12/23/2020   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   Delirium 12/27/2018   Seizure disorder (HCC) 07/24/2018   MDD (major depressive disorder), recurrent severe, 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   Social 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 10/19/2016   Polysubstance abuse (HCC) 10/19/2016   Benzodiazepine dependence, episodic (HCC) 10/14/2016   Moderate tetrahydrocannabinol (THC) dependence (HCC) 05/31/2016   Opioid use disorder, severe, dependence (HCC) 10/29/2014   ADD (attention deficit disorder) 02/15/2012   Well adult exam 09/02/2011   Rash 09/02/2011   Chronic anxiety 06/05/2011   Referrals to Alternative Service(s): Referred to Alternative Service(s):   Place:   Date:   Time:  Referred to Alternative Service(s):   Place:   Date:   Time:    Referred to Alternative Service(s):   Place:   Date:   Time:    Referred to Alternative Service(s):   Place:   Date:   Time:     Venora Maples, [facility]

## 2020-12-23 NOTE — [facility] Notes (Signed)
Transferred to room 318 via w/c accompanied by RN.

## 2020-12-23 NOTE — ED Notes (Signed)
Pt asleep.

## 2020-12-23 NOTE — ED Provider Notes (Addendum)
[facility] EMERGENCY DEPARTMENT Provider Note   CSN: [id number] Arrival date & time: 12/22/20  1840     History Chief Complaint  Patient presents with   Withdrawal    Nicholas Harrison is a 30 y.o. male.  Patient is a 30 year old male with PMH of polysubstance abuse, ADHD, chronic anxiety.  Patient presenting today for evaluation of opioid withdrawal.  Patient tells me he has been abusing fentanyl daily for many months.  He last used on Sunday and is now having shaking, tremors, vomiting, and difficulty tolerating p.o. intake.  He has been hospitalized in the past for withdrawal symptoms.  He denies any fevers or chills.  He does describe body aches and abdominal cramping.  The history is provided by the patient.      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 10/19/2016   Polysubstance abuse (HCC) 10/19/2016   Benzodiazepine dependence, episodic (HCC) 10/14/2016   Moderate tetrahydrocannabinol (THC) dependence (HCC) 05/31/2016   Opioid use disorder, severe, dependence (HCC) 10/29/2014   ADD (attention deficit disorder) 02/15/2012   Well adult exam 09/02/2011   Rash 09/02/2011   Chronic anxiety 06/05/2011    Past Surgical History:  Procedure Laterality Date   HAND SURGERY         Family History  Problem Relation Age of Onset   Depression Mother        anxiety   Diabetes Maternal Grandfather     Social History   Tobacco Use   Smoking status: Former   Smokeless tobacco: Former    Types: Associate Professor Use: Every day  Substance Use Topics   Alcohol use: Not  Currently   Drug use: Yes    Types: Benzodiazepines, Oxycodone, Marijuana    Comment: xanax    Home Medications Prior to Admission medications   Medication Sig Start Date End Date Taking? Authorizing Provider  busPIRone (BUSPAR) 10 MG tablet Take by mouth. 12/16/20 12/16/21 Yes [provider]  naloxone (NARCAN) nasal spray 4 mg/0.1 mL Place 0.4 mg into the nose once.  05/14/19  Yes [provider]  QUEtiapine (SEROQUEL) 100 MG tablet Take by mouth. 01/27/20  Yes [provider]  busPIRone (BUSPAR) 10 MG tablet Take 10 mg by mouth 2 (two) times daily. 12/11/19   [provider]  doxycycline (VIBRA-TABS) 100 MG tablet Take 100 mg by mouth 2 (two) times daily. Patient not taking: Reported on 12/22/2020 01/07/20   [provider]  propranolol (INDERAL) 20 MG tablet Take 20 mg by mouth 3 (three) times daily. Patient not taking: Reported on 12/22/2020 01/27/20   [provider]  sertraline (ZOLOFT) 50 MG tablet Take 50 mg by mouth daily. 01/27/20   [provider]    Allergies    Buspar [buspirone] and Seroquel [quetiapine fumarate]  Review of Systems   Review of Systems  All other systems reviewed and are negative.  Physical Exam Updated Vital Signs BP (!) 116/105 (BP Location: Right Arm)   Pulse (!) 108   Temp 98 F (36.7 C) (Oral)  Resp 18   Ht 5\' 9"  (1.753 m)   Wt 81.6 kg   SpO2 98%   BMI 26.58 kg/m   Physical Exam Vitals and nursing note reviewed.  Constitutional:      General: He is not in acute distress.    Appearance: He is well-developed. He is not diaphoretic.     Comments: Patient appears anxious and tremulous.  HENT:     Head: Normocephalic and atraumatic.  Cardiovascular:     Rate and Rhythm: Normal rate and regular rhythm.     Heart sounds: No murmur heard.   No friction rub.  Pulmonary:     Effort: Pulmonary effort is normal. No respiratory distress.     Breath sounds: Normal breath sounds. No  wheezing or rales.  Abdominal:     General: Bowel sounds are normal. There is no distension.     Palpations: Abdomen is soft.     Tenderness: There is no abdominal tenderness.  Musculoskeletal:        General: Normal range of motion.     Cervical back: Normal range of motion and neck supple.  Skin:    General: Skin is warm and dry.  Neurological:     Mental Status: He is alert and oriented to person, place, and time.     Coordination: Coordination normal.    ED Results / Procedures / Treatments   Labs (all labs ordered are listed, but only abnormal results are displayed) Labs Reviewed  COMPREHENSIVE METABOLIC PANEL - Abnormal; Notable for the following components:      Result Value   Sodium 129 (*)    Potassium 2.0 (*)    Chloride 76 (*)    Glucose, Bld 167 (*)    BUN 44 (*)    Creatinine, Ser 2.08 (*)    Total Protein 8.5 (*)    Albumin 5.3 (*)    Total Bilirubin 3.1 (*)    GFR, Estimated 44 (*)    Anion gap 23 (*)    All other components within normal limits  CBC WITH DIFFERENTIAL/PLATELET - Abnormal; Notable for the following components:   WBC 15.4 (*)    Hemoglobin 17.2 (*)    MCHC 36.6 (*)    Platelets 465 (*)    Neutro Abs 12.5 (*)    Monocytes Absolute 1.6 (*)    Abs Immature Granulocytes 0.13 (*)    All other components within normal limits  CBG MONITORING, ED - Abnormal; Notable for the following components:   Glucose-Capillary 172 (*)    All other components within normal limits  RESP PANEL BY RT-PCR (FLU A&B, COVID) ARPGX2  ETHANOL  RAPID URINE DRUG SCREEN, HOSP PERFORMED    EKG EKG Interpretation  Date/Time:  Wednesday December 23 2020 01:32:47 EDT Ventricular Rate:  95 PR Interval:  135 QRS Duration: 115 QT Interval:  475 QTC Calculation: 598 R Axis:   76 Text Interpretation: Sinus rhythm Incomplete right bundle branch block Abnormal T, consider ischemia, diffuse leads Confirmed by 01-30-1975 (Geoffery Lyons) on 12/23/2020 1:36:28 AM  Radiology No  results found.  Procedures Procedures   Medications Ordered in ED Medications  cloNIDine (CATAPRES) tablet 0.1 mg (has no administration in time range)    Followed by  cloNIDine (CATAPRES) tablet 0.1 mg (has no administration in time range)    Followed by  cloNIDine (CATAPRES) tablet 0.1 mg (has no administration in time range)  dicyclomine (BENTYL) tablet 20 mg (has no administration in time range)  hydrOXYzine (ATARAX/VISTARIL) tablet  25 mg (has no administration in time range)  loperamide (IMODIUM) capsule 2-4 mg (has no administration in time range)  methocarbamol (ROBAXIN) tablet 500 mg (has no administration in time range)  naproxen (NAPROSYN) tablet 500 mg (has no administration in time range)  ondansetron (ZOFRAN-ODT) disintegrating tablet 4 mg (has no administration in time range)  sodium chloride 0.9 % bolus 1,000 mL (has no administration in time range)  LORazepam (ATIVAN) injection 1 mg (has no administration in time range)  potassium chloride 10 mEq in 100 mL IVPB (has no administration in time range)  potassium chloride SA (KLOR-CON) CR tablet 40 mEq (has no administration in time range)    ED Course  I have reviewed the triage vital signs and the nursing notes.  Pertinent labs & imaging results that were available during my care of the patient were reviewed by me and considered in my medical decision making (see chart for details).    MDM Rules/Calculators/A&P  Patient with history of opioid abuse presenting with withdrawal symptoms.  He describes vomiting, tremulousness, and decreased p.o. intake.  Patient's laboratory studies reveal a potassium of 2.0 and creatinine of 2.1.  His BUN is 44 consistent with dehydration.  Patient given IV hydration along with IV and oral potassium.  Care was discussed with hospitalist and patient will be admitted for correction of his potassium and hydration.  CRITICAL CARE Performed by: Geoffery Lyons Total critical care time: 35  minutes Critical care time was exclusive of separately billable procedures and treating other patients. Critical care was necessary to treat or prevent imminent or life-threatening deterioration. Critical care was time spent personally by me on the following activities: development of treatment plan with patient and/or surrogate as well as nursing, discussions with consultants, evaluation of patient's response to treatment, examination of patient, obtaining history from patient or surrogate, ordering and performing treatments and interventions, ordering and review of laboratory studies, ordering and review of radiographic studies, pulse oximetry and re-evaluation of patient's condition.   Final Clinical Impression(s) / ED Diagnoses Final diagnoses:  None    Rx / DC Orders ED Discharge Orders     None        Geoffery Lyons, MD 12/23/20 0201    Geoffery Lyons, MD 12/23/20 0202

## 2020-12-23 NOTE — TOC Progression Note (Signed)
Transition of Care [facility]) - Progression Note    Patient Details  Name: Nicholas Harrison MRN: [id number] Date of Birth: Nov 16, 1990  [facility] CM/SW Contact  Karn Cassis, [place] Phone Number: 12/23/2020, 10:38 AM  Clinical Narrative:  TOC received consult for substance abuse counseling. Pt has already been assessed by [facility]. From [facility] note: TTS faxed resources for discharge, including medication management, substance usage treatment and outpatient therapy. Patient and mother, Khaliq Turay agreed that she would take patient to Kathlen Brunswick at [facility] for his [employer] once patient is discharged from [facility].   Pt now being admitted. TOC will continue to follow.       Barriers to Discharge: Continued Medical Work up  Expected Discharge Plan and Services                                                 Social Determinants of Health (SDOH) Interventions    Readmission Risk Interventions No flowsheet data found.

## 2020-12-23 NOTE — Progress Notes (Signed)
PROGRESS NOTE    Patient: Nicholas Harrison                            PCP: Tresa Garter, MD                    DOB: [birth year]            DOA: 12/22/2020 [id number]             DOS: 12/23/2020, 10:09 AM   LOS: 0 days   Date of Service: The patient was seen and examined on 12/23/2020  Subjective:   The patient was seen and examined this morning. Stable at this time. Still complaining of anxiety but improved nausea vomiting,....    Brief Narrative:    Nicholas Harrison  is a 30 y.o. male, with history of anxiety, depression, drug-induced seizure, substance abuse, presents to the ED with chief complaint of withdrawal.  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  Patient reports that he quit cold [place], with the plan to go to rehab.  Patient reports he has had nausea and vomiting over the past couple days.  He has not had any abdominal pain or diarrhea.  He has been agitated and shaky and also had generalized weakness.  No hallucinations ED Temp 98, heart rate 108-129, respiratory rate 18-19, blood pressure 150/118, maintaining saturations on room air Patient had a leukocytosis of 15.4, hemoglobin 17.2-worth noting that patient normally has a leukocytosis on his blood work Automotive engineer reveals a hyponatremia at 129 hypokalemia at 2.0, hypochloremia at 76, elevated BUN at 44, elevated creatinine 2.08 Alcohol level less than 10 Clonidine taper, Bentyl, hydroxyzine, Imodium, Ativan, Robaxin, naproxen, Zofran, 1 L normal saline, 50 mEq of potassium given in the ED Admission requested for management of hypokalemia and AKI    Assessment & Plan:   Active Problems:   Chronic anxiety   Opioid use disorder, severe, dependence (HCC)   Moderate tetrahydrocannabinol (THC) dependence (HCC)   Withdrawal complaint   Prolonged QT interval   AKI (acute kidney injury) (HCC)   Hyponatremia   Withdrawal--fentanyl Monitor COWS score As needed Ativan, along with as  needed Atarax,  Supportive care and symptom control Continue clonidine Opioid use disorder severe dependence Patient is planning to go to rehab after admission..  In [facility] Continue supportive care Continue clonidine taper Hypokalemia/hyponatremia Potassium 2.0 >> 2.3 Sodium 129, 129 Potassium will be repleted orally 60 meq, along with normal saline with 40 mEq Secondary to GI losses 50 mEq given in the ED Recheck electrolytes around noon and in a.m. AKI -likely due to dehydration Baseline creatinine 1.3 Today the creatinine is 2.08 >>> 2.16 We will continue IV fluid hydration with normal saline Continue to monitor Anxiety Continue BuSpar and as needed Ativan  Hypovolemic hyponatremia Sodium 129 Continue IV fluids Trend in the a.m. SIADH work-up, likely due to volume loss electrolytes        Cultures; None   Antimicrobials: None    Consultants: TOC    ------------------------------------------------------------------------------------------------------------------------------------------------  DVT prophylaxis:  SCD/Compression stockings Code Status:   Code Status: Full Code  Family Communication: No family member present at bedside- attempt will be made to update daily The above findings and plan of care has been discussed with patient (and family)  in detail,  they expressed understanding and agreement of above. -Advance care planning has been discussed.   Admission status:  Status is: Observation  The patient remains OBS appropriate and will d/c before 2 midnights.  Dispo: The patient is from: Home              Anticipated d/c is to: Home in 1-2 days               Patient currently is not medically stable to d/c.   Difficult to place patient No      Level of care: Telemetry   Procedures:   No admission procedures for hospital encounter.    Antimicrobials:  Anti-infectives (From admission, onward)    None        Medication:    busPIRone  10 mg Oral BID   cloNIDine  0.1 mg Oral QID   Followed by   Melene Muller ON 12/24/2020] cloNIDine  0.1 mg Oral BID   Followed by   Melene Muller ON 12/27/2020] cloNIDine  0.1 mg Oral Daily   folic acid  1 mg Oral Daily   heparin  5,000 Units Subcutaneous Q8H   multivitamin with minerals  1 tablet Oral Daily   thiamine  100 mg Oral Daily   Or   thiamine  100 mg Intravenous Daily    acetaminophen **OR** acetaminophen, dicyclomine, hydrOXYzine, ketorolac, loperamide, LORazepam, methocarbamol (ROBAXIN) IV, methocarbamol, naproxen, prochlorperazine   Objective:   Vitals:   12/23/20 0145 12/23/20 0400 12/23/20 0700 12/23/20 0730  BP: 104/83 119/82 125/71 113/67  Pulse: 97 88 77 75  Resp: (!) 22 (!) 23 18   Temp:      TempSrc:      SpO2: 100% 100% 100% 99%  Weight:      Height:        Intake/Output Summary (Last 24 hours) at 12/23/2020 1009 Last data filed at 12/23/2020 0153 Gross per 24 hour  Intake 1100 ml  Output --  Net 1100 ml   Filed Weights   12/22/20 1922  Weight: 81.6 kg     Examination:   Physical Exam  Constitution:  Alert, cooperative, no distress,  Appears calm and comfortable  Psychiatric: Normal and stable mood and affect, cognition intact,   HEENT: Normocephalic, PERRL, otherwise with in Normal limits  Chest:Chest symmetric Cardio vascular:  S1/S2, RRR, No murmure, No Rubs or Gallops  pulmonary: Clear to auscultation bilaterally, respirations unlabored, negative wheezes / crackles Abdomen: Soft, non-tender, non-distended, bowel sounds,no masses, no organomegaly Muscular skeletal: Limited exam - in bed, able to move all 4 extremities, Normal strength,  Neuro: CNII-XII intact. , normal motor and sensation, reflexes intact  Extremities: No pitting edema lower extremities, +2 pulses  Skin: Dry, warm to touch, negative for any Rashes, No open wounds Wounds: per nursing documentation     ------------------------------------------------------------------------------------------------------------------------------------------    LABs:  CBC Latest Ref Rng & Units 12/23/2020 12/22/2020 02/26/2020  WBC 4.0 - 10.5 K/uL 12.6(H) 15.4(H) 15.3(H)  Hemoglobin 13.0 - 17.0 g/dL 70.6 17.2(H) 16.0  Hematocrit 39.0 - 52.0 % 43.3 47.0 47.5  Platelets 150 - 400 K/uL 322 465(H) 293   CMP Latest Ref Rng & Units 12/23/2020 12/22/2020 02/26/2020  Glucose 70 - 99 mg/dL 237(S) 283(T) 517(O)  BUN 6 - 20 mg/dL 16(W) 73(X) 14  Creatinine 0.61 - 1.24 mg/dL 1.06(Y) 6.94(W) 5.46(E)  Sodium 135 - 145 mmol/L 129(L) 129(L) 139  Potassium 3.5 - 5.1 mmol/L 2.3(LL) 2.0(LL) 4.6  Chloride 98 - 111 mmol/L 77(L) 76(L) 104  CO2 22 - 32 mmol/L 37(H) 30 20(L)  Calcium 8.9 - 10.3 mg/dL 7.0(J) 9.7 9.2  Total Protein 6.5 - 8.1 g/dL 7.2 4.3(X) 7.5  Total Bilirubin 0.3 - 1.2 mg/dL 2.1(H) 3.1(H) 0.5  Alkaline Phos 38 - 126 U/L 57 69 64  AST 15 - 41 U/L 24 37 30  ALT 0 - 44 U/L 18 23 19        Micro Results Recent Results (from the past 240 hour(s))  Resp Panel by RT-PCR (Flu A&B, Covid) Nasopharyngeal Swab     Status: None   Collection Time: 12/23/20 12:23 AM   Specimen: Nasopharyngeal Swab; Nasopharyngeal(NP) swabs in vial transport medium  Result Value Ref Range Status   SARS Coronavirus 2 by RT PCR NEGATIVE NEGATIVE Final    Comment: (NOTE) SARS-CoV-2 target nucleic acids are NOT DETECTED.  The SARS-CoV-2 RNA is generally detectable in upper respiratory specimens during the acute phase of infection. The lowest concentration of SARS-CoV-2 viral copies this assay can detect is 138 copies/mL. A negative result does not preclude SARS-Cov-2 infection and should not be used as the sole basis for treatment or other patient management decisions. A negative result may occur with  improper specimen collection/handling, submission of specimen other than nasopharyngeal swab, presence of viral mutation(s) within  the areas targeted by this assay, and inadequate number of viral copies(<138 copies/mL). A negative result must be combined with clinical observations, patient history, and epidemiological information. The expected result is Negative.  Fact Sheet for Patients:  02/22/21  Fact Sheet for Healthcare Providers:  BloggerCourse.com  This test is no t yet approved or cleared by the [place] FDA and  has been authorized for detection and/or diagnosis of SARS-CoV-2 by FDA under an Emergency Use Authorization (EUA). This EUA will remain  in effect (meaning this test can be used) for the duration of the COVID-19 declaration under Section 564(b)(1) of the Act, 21 U.S.C.section 360bbb-3(b)(1), unless the authorization is terminated  or revoked sooner.       Influenza A by PCR NEGATIVE NEGATIVE Final   Influenza B by PCR NEGATIVE NEGATIVE Final    Comment: (NOTE) The Xpert Xpress SARS-CoV-2/FLU/RSV plus assay is intended as an aid in the diagnosis of influenza from Nasopharyngeal swab specimens and should not be used as a sole basis for treatment. Nasal washings and aspirates are unacceptable for Xpert Xpress SARS-CoV-2/FLU/RSV testing.  Fact Sheet for Patients: [place]  Fact Sheet for Healthcare Providers: BloggerCourse.com  This test is not yet approved or cleared by the [place] FDA and has been authorized for detection and/or diagnosis of SARS-CoV-2 by FDA under an Emergency Use Authorization (EUA). This EUA will remain in effect (meaning this test can be used) for the duration of the COVID-19 declaration under Section 564(b)(1) of the Act, 21 U.S.C. section 360bbb-3(b)(1), unless the authorization is terminated or revoked.  Performed at [facility], [address], [place], [place]     Radiology Reports No results found.  SIGNED: [id number], MD, FHM. [facility],  Pager (please use [email or web address] to page/text) Please use Epic Secure Chat for non-urgent communication (7AM-7PM)  If 7PM-7AM, please contact night-coverage [email or web address], 12/23/2020, 10:09 AM

## 2020-12-24 LAB — BASIC METABOLIC PANEL
Anion gap: 8 (ref 5–15)
BUN: 34 mg/dL — ABNORMAL HIGH (ref 6–20)
CO2: 29 mmol/L (ref 22–32)
Calcium: 8.3 mg/dL — ABNORMAL LOW (ref 8.9–10.3)
Chloride: 96 mmol/L — ABNORMAL LOW (ref 98–111)
Creatinine, Ser: 0.99 mg/dL (ref 0.61–1.24)
GFR, Estimated: >60 mL/min (ref >60)
Glucose, Bld: 98 mg/dL (ref 70–99)
Potassium: 3.4 mmol/L — ABNORMAL LOW (ref 3.5–5.1)
Sodium: 133 mmol/L — ABNORMAL LOW (ref 135–145)

## 2020-12-24 MED ORDER — ALUM & MAG HYDROXIDE-SIMETH 200-200-20 MG/5ML PO SUSP: 15.0000 mL | Freq: Four times a day (QID) | ORAL | Status: DC | PRN

## 2020-12-24 MED ORDER — POTASSIUM CHLORIDE CRYS ER 20 MEQ PO TBCR
30.0000 meq | EXTENDED_RELEASE_TABLET | ORAL | Status: AC
  Administered 2020-12-24 (×2): 30 meq via ORAL
  Filled 2020-12-24 (×2): qty 1

## 2020-12-24 MED ORDER — SERTRALINE HCL 50 MG PO TABS
50.0000 mg | ORAL_TABLET | Freq: Every day | ORAL | Status: DC
  Administered 2020-12-24 – 2020-12-25 (×2): 50 mg via ORAL
  Filled 2020-12-24 (×2): qty 1

## 2020-12-24 MED ORDER — PANTOPRAZOLE SODIUM 40 MG PO TBEC
40.0000 mg | DELAYED_RELEASE_TABLET | Freq: Every day | ORAL | Status: DC
  Administered 2020-12-24 – 2020-12-25 (×2): 40 mg via ORAL
  Filled 2020-12-24 (×2): qty 1

## 2020-12-24 NOTE — Progress Notes (Signed)
PROGRESS NOTE    TION TSE  ZLD:[id number] DOB: 02-22-1991 DOA: 12/22/2020 PCP: Tresa Garter, MD    Brief Narrative:  Nicholas Harrison is a 30 year old male with past medical history significant for anxiety, depression, substance abuse with narcotics who presents to [facility] ED on 10/4 with worsening anxiety, nausea/vomiting, agitation, shakiness and generalized weakness.  Patient reports recently quit illicit opioid abuse cold [place] with plans to go to rehab.  Denies hallucinations.  In the ED, temperature 98.0 F, HR 108-129, RR 18-19, BP 150/118, SPO2 100% on room air.  Sodium 129, potassium 2.0, chloride 76, CO2 30, BUN 44, creatinine 2.08, glucose 167.  Magnesium 2.7.  WBC 15.4, hemoglobin 17.2, platelets 465.  EtOH level less than 10.  UDS positive for THC.  Influenza A/B PCR negative.  COVID-19 PCR negative.  EDP consulted [facility] for further evaluation and management of acute narcotic withdrawal in the setting of hyponatremia, hypokalemia and intractable nausea/vomiting.   Assessment & Plan:   Active Problems:   Chronic anxiety   Opioid use disorder, severe, dependence (HCC)   Moderate tetrahydrocannabinol (THC) dependence (HCC)   Withdrawal complaint   Prolonged QT interval   AKI (acute kidney injury) (HCC)   Hyponatremia   Acute narcotic withdrawal Opioid use disorder, severe dependence Patient presenting to the ED after acute discontinuation of illicit fentanyl use outpatient.  Reported worsening anxiety/agitation with associated nausea/vomiting, generalized weakness and shakiness.  Seen by behavioral health and plan outpatient follow-up at [facility] rehab in [place] for Vivatrol injections. --Continue clonidine taper --Ativan/Atarax as needed for agitation/anxiety --Supportive care, antiemetics  Hypovolemic hyponatremia Patient presenting with narcotic withdrawal as above associated with nausea/vomiting.  Sodium level on arrival 129, likely secondary  to hypovolemic hyponatremia from dehydration. --Na 129>>133 --Discontinue IV fluid hydration today as nausea and vomiting resolved --Encourage increase oral intake --Repeat BMP in a.m.  Hypokalemia Potassium 2.0 on admission, likely GI loss from nausea/vomiting as above. --K 2.0>2.9>3.4 --Continue to replete potassium today --Repeat electrolytes in a.m. to include magnesium  Acute renal failure Creatinine 2.08 on admission, likely prerenal azotemia in the setting of dehydration from nausea/vomiting secondary to narcotic withdrawal.  Symptoms much improved.  Received IV fluid hydration, now discontinued. --Cr 2.08>>0.99 --Supportive care, encourage increase oral intake today --Repeat BMP in a.m.  Anxiety/depression --BuSpar 10 mg p.o. twice daily --Sertraline 50 mg p.o. daily  GERD: Start Protonix 40 mg p.o. daily   DVT prophylaxis: heparin injection 5,000 Units Start: 12/23/20 0600 SCDs Start: 12/23/20 0137   Code Status: Full Code Family Communication: No family present at bedside this morning  Disposition Plan:  Level of care: Telemetry Status is: Inpatient  Remains inpatient appropriate because:Persistent severe electrolyte disturbances, Unsafe d/c plan, and Inpatient level of care appropriate due to severity of illness  Dispo: The patient is from: Home              Anticipated d/c is to: Home              Patient currently is not medically stable to d/c.   Difficult to place patient No   Consultants:  Behavioral health  Procedures:  None  Antimicrobials:  None   Subjective: Patient seen and examined at bedside, resting comfortably.  Slightly anxious overnight.  States he has a hard time remaining still for long peers of time, hard to "stay in room all day".  Patient reports his nausea and vomiting have resolved, requesting something for; stomach irritation".  Has plans  to go to rehab in [facility] in which his mother will take on discharge for Vivatrol  injections.  Understands the severity of his condition and wants to make drastic changes in his life.  Congratulated him on this outlook.  No other questions or concerns at this time.  Denies headache, no dizziness, no chest pain, no palpitations, no shortness of breath, no abdominal pain, no weakness, no fatigue, no paresthesias.  No acute events overnight per nursing.  Objective: Vitals:   12/23/20 2026 12/23/20 2050 12/24/20 0115 12/24/20 0545  BP: 104/63 108/65 (!) 99/57 109/71  Pulse: 76 76 71 77  Resp: 16 19 15 15   Temp: 98 F (36.7 C) 98.2 F (36.8 C) 98.1 F (36.7 C) 98.4 F (36.9 C)  TempSrc: Oral Oral Oral Oral  SpO2: 100% 100% 98% 100%  Weight:      Height:        Intake/Output Summary (Last 24 hours) at 12/24/2020 0908 Last data filed at 12/24/2020 0300 Gross per 24 hour  Intake 2644.96 ml  Output 800 ml  Net 1844.96 ml   Filed Weights   12/22/20 1922 12/23/20 1650  Weight: 81.6 kg 80.7 kg    Examination:  General exam: Appears calm and comfortable  Respiratory system: Clear to auscultation. Respiratory effort normal. Cardiovascular system: S1 & S2 heard, RRR. No JVD, murmurs, rubs, gallops or clicks. No pedal edema. Gastrointestinal system: Abdomen is nondistended, soft and nontender. No organomegaly or masses felt. Normal bowel sounds heard. Central nervous system: Alert and oriented. No focal neurological deficits. Extremities: Symmetric 5 x 5 power. Skin: No rashes, lesions or ulcers Psychiatry: Judgement and insight appear normal. Mood & affect appropriate.     Data Reviewed: I have personally reviewed following labs and imaging studies  CBC: Recent Labs  Lab 12/22/20 2206 12/23/20 0445  WBC 15.4* 12.6*  NEUTROABS 12.5* 9.4*  HGB 17.2* 15.4  HCT 47.0 43.3  MCV 81.0 83.1  PLT 465* 322   Basic Metabolic Panel: Recent Labs  Lab 12/22/20 2206 12/23/20 0445 12/23/20 1052 12/24/20 0537  NA 129* 129* 129* 133*  K 2.0* 2.3* 2.9* 3.4*  CL 76*  77* 80* 96*  CO2 30 37* 36* 29  GLUCOSE 167* 127* 135* 98  BUN 44* 50* 47* 34*  CREATININE 2.08* 2.16* 1.59* 0.99  CALCIUM 9.7 8.4* 8.3* 8.3*  MG 2.7* 2.4  --   --    GFR: Estimated Creatinine Clearance: 111.1 mL/min (by C-G formula based on SCr of 0.99 mg/dL). Liver Function Tests: Recent Labs  Lab 12/22/20 2206 12/23/20 0445  AST 37 24  ALT 23 18  ALKPHOS 69 57  BILITOT 3.1* 2.1*  PROT 8.5* 7.2  ALBUMIN 5.3* 4.4   No results for input(s): LIPASE, AMYLASE in the last 168 hours. No results for input(s): AMMONIA in the last 168 hours. Coagulation Profile: No results for input(s): INR, PROTIME in the last 168 hours. Cardiac Enzymes: No results for input(s): CKTOTAL, CKMB, CKMBINDEX, TROPONINI in the last 168 hours. BNP (last 3 results) No results for input(s): PROBNP in the last 8760 hours. HbA1C: No results for input(s): HGBA1C in the last 72 hours. CBG: Recent Labs  Lab 12/22/20 2211  GLUCAP 172*   Lipid Profile: No results for input(s): CHOL, HDL, LDLCALC, TRIG, CHOLHDL, LDLDIRECT in the last 72 hours. Thyroid Function Tests: No results for input(s): TSH, T4TOTAL, FREET4, T3FREE, THYROIDAB in the last 72 hours. Anemia Panel: No results for input(s): VITAMINB12, FOLATE, FERRITIN, TIBC, IRON, RETICCTPCT  in the last 72 hours. Sepsis Labs: No results for input(s): PROCALCITON, LATICACIDVEN in the last 168 hours.  Recent Results (from the past 240 hour(s))  Resp Panel by RT-PCR (Flu A&B, Covid) Nasopharyngeal Swab     Status: None   Collection Time: 12/23/20 12:23 AM   Specimen: Nasopharyngeal Swab; Nasopharyngeal(NP) swabs in vial transport medium  Result Value Ref Range Status   SARS Coronavirus 2 by RT PCR NEGATIVE NEGATIVE Final    Comment: (NOTE) SARS-CoV-2 target nucleic acids are NOT DETECTED.  The SARS-CoV-2 RNA is generally detectable in upper respiratory specimens during the acute phase of infection. The lowest concentration of SARS-CoV-2 viral copies  this assay can detect is 138 copies/mL. A negative result does not preclude SARS-Cov-2 infection and should not be used as the sole basis for treatment or other patient management decisions. A negative result may occur with  improper specimen collection/handling, submission of specimen other than nasopharyngeal swab, presence of viral mutation(s) within the areas targeted by this assay, and inadequate number of viral copies(<138 copies/mL). A negative result must be combined with clinical observations, patient history, and epidemiological information. The expected result is Negative.  Fact Sheet for Patients:  BloggerCourse.com  Fact Sheet for Healthcare Providers:  [employer]ker.[employer]  This test is no t yet approved or cleared by the [place] FDA and  has been authorized for detection and/or diagnosis of SARS-CoV-2 by FDA under an Emergency Use Authorization (EUA). This EUA will remain  in effect (meaning this test can be used) for the duration of the COVID-19 declaration under Section 564(b)(1) of the Act, 21 U.S.C.section 360bbb-3(b)(1), unless the authorization is terminated  or revoked sooner.       Influenza A by PCR NEGATIVE NEGATIVE Final   Influenza B by PCR NEGATIVE NEGATIVE Final    Comment: (NOTE) The Xpert Xpress SARS-CoV-2/FLU/RSV plus assay is intended as an aid in the diagnosis of influenza from Nasopharyngeal swab specimens and should not be used as a sole basis for treatment. Nasal washings and aspirates are unacceptable for Xpert Xpress SARS-CoV-2/FLU/RSV testing.  Fact Sheet for Patients: BloggerCourse.com  Fact Sheet for Healthcare Providers: [employer]ious[employer]ker.it  This test is not yet approved or cleared by the [place] FDA and has been authorized for detection and/or diagnosis of SARS-CoV-2 by FDA under an Emergency Use Authorization (EUA). This EUA  will remain in effect (meaning this test can be used) for the duration of the COVID-19 declaration under Section 564(b)(1) of the Act, 21 U.S.C. section 360bbb-3(b)(1), unless the authorization is terminated or revoked.  Performed at [facility], [address] [place], [place] [address]          Radiology Studies: No results found.      Scheduled Meds:  busPIRone  10 mg Oral BID   cloNIDine  0.1 mg Oral QID   Followed by   cloNIDine  0.1 mg Oral BID   Followed by   Melene Muller ON 12/27/2020] cloNIDine  0.1 mg Oral Daily   folic acid  1 mg Oral Daily   heparin  5,000 Units Subcutaneous Q8H   multivitamin with minerals  1 tablet Oral Daily   pantoprazole  40 mg Oral Daily   potassium chloride  30 mEq Oral Q4H   thiamine  100 mg Oral Daily   Or   thiamine  100 mg Intravenous Daily   Continuous Infusions:  methocarbamol (ROBAXIN) IV       LOS: 1 day    Time spent: 39 minutes spent  on chart review, discussion with nursing staff, consultants, updating family and interview/physical exam; more than 50% of that time was spent in counseling and/or coordination of care.    Alvira Philips [place], DO [facility] Available via Epic secure chat 7am-7pm After these hours, please refer to coverage provider listed on [email or web address] 12/24/2020, 9:08 AM

## 2020-12-24 NOTE — Progress Notes (Signed)
Assumed pt care at 0700. A&O. Pt resting comfortably in bed in no acute distress. Denies pain. Pt c/o mild anxiety, sneezing, and nausea/diarrhea, see MAR. Tolerating liquids, but states "I can't eat this food". Educated pt on the need to establish a connection with CBT to address effective coping mechanisms for difficulties in life. Pt expressed eagerness and stated "I can't live this way anymore". Emotional support provided to pt.Call bell within reach. Will continue to monitor.

## 2020-12-25 LAB — BASIC METABOLIC PANEL
Anion gap: 6 (ref 5–15)
BUN: 21 mg/dL — ABNORMAL HIGH (ref 6–20)
CO2: 27 mmol/L (ref 22–32)
Calcium: 8.6 mg/dL — ABNORMAL LOW (ref 8.9–10.3)
Chloride: 103 mmol/L (ref 98–111)
Creatinine, Ser: 0.98 mg/dL (ref 0.61–1.24)
GFR, Estimated: >60 mL/min (ref >60)
Glucose, Bld: 105 mg/dL — ABNORMAL HIGH (ref 70–99)
Potassium: 3.7 mmol/L (ref 3.5–5.1)
Sodium: 136 mmol/L (ref 135–145)

## 2020-12-25 LAB — MAGNESIUM: Magnesium: 2.5 mg/dL — ABNORMAL HIGH (ref 1.7–2.4)

## 2020-12-25 MED ORDER — NALTREXONE HCL 50 MG PO TABS: 50.0000 mg | ORAL_TABLET | Freq: Every day | ORAL | 0 refills | Status: AC

## 2020-12-25 NOTE — Discharge Summary (Signed)
Physician Discharge Summary  Nicholas Harrison ZOX:[id number] DOB: 07-Jan-1991 DOA: 12/22/2020  PCP: Cassandria Anger, MD  Admit date: 12/22/2020 Discharge date: 12/25/2020  Admitted From: Home Disposition: Home  Recommendations for Outpatient Follow-up:  Follow up with PCP in 1-2 weeks  Please obtain BMP/CBC in one week Please follow up on the following pending results:  Home Health: No Equipment/Devices: None  Discharge Condition: Stable CODE STATUS: Full code Diet recommendation: Regular diet  History of present illness:  Nicholas Harrison is a 30 year old male with past medical history significant for anxiety, depression, substance abuse with narcotics who presents to [facility] ED on 10/4 with worsening anxiety, nausea/vomiting, agitation, shakiness and generalized weakness.  Patient reports recently quit illicit opioid abuse cold [place] with plans to go to rehab.  Denies hallucinations.   In the ED, temperature 98.0 F, HR 108-129, RR 18-19, BP 150/118, SPO2 100% on room air.  Sodium 129, potassium 2.0, chloride 76, CO2 30, BUN 44, creatinine 2.08, glucose 167.  Magnesium 2.7.  WBC 15.4, hemoglobin 17.2, platelets 465.  EtOH level less than 10.  UDS positive for THC.  Influenza A/B PCR negative.  COVID-19 PCR negative.  EDP consulted [facility] for further evaluation and management of acute narcotic withdrawal in the setting of hyponatremia, hypokalemia and intractable nausea/vomiting.  Hospital course:  Acute narcotic withdrawal Opioid use disorder, severe dependence Patient presenting to the ED after acute discontinuation of illicit fentanyl use outpatient.  Reported worsening anxiety/agitation with associated nausea/vomiting, generalized weakness and shakiness.  Was placed on COWS protocol with symptom triggered Ativan and clonidine taper while inpatient.  Patient symptoms resolved.  Seen by behavioral health and plan outpatient follow-up at [facility] rehab in [place] for  Vivatrol injections.  Continue naltrexone 50 mg p.o. daily until outpatient follow-up for Vivatrol injection.  Patient's counseled on several occasions by multiple providers during the hospitalization that needs complete cessation from illicit drugs; and patient appears to be willing and has found a sponsor prior to discharge to assist him.   Hypovolemic hyponatremia Patient presenting with narcotic withdrawal as above associated with nausea/vomiting.  Sodium level on arrival 129, likely secondary to hypovolemic hyponatremia from dehydration.  Patient was started on IV fluid hydration with improvement of sodium to 136 at time of discharge.  Continue to encourage increased oral intake.   Hypokalemia Potassium 2.0 on admission, likely GI loss from nausea/vomiting as above.  Patient's potassium was aggressively repleted during hospitalization.  Potassium 3.7 at time of discharge.   Acute renal failure Creatinine 2.08 on admission, likely prerenal azotemia in the setting of dehydration from nausea/vomiting secondary to narcotic withdrawal.  Symptoms much improved.  Received IV fluid hydration, now discontinued.  Patients creatinine was 0.98 at time of discharge.   Anxiety/depression BuSpar 10 mg p.o. twice daily, Sertraline 50 mg p.o. daily   Discharge Diagnoses:  Active Problems:   Chronic anxiety   Opioid use disorder, severe, dependence (HCC)   Moderate tetrahydrocannabinol (THC) dependence (HCC)   Withdrawal complaint   Prolonged QT interval    Discharge Instructions  Discharge Instructions     Call MD for:  difficulty breathing, headache or visual disturbances   Complete by: As directed    Call MD for:  extreme fatigue   Complete by: As directed    Call MD for:  persistant dizziness or light-headedness   Complete by: As directed    Call MD for:  persistant nausea and vomiting   Complete by: As directed  Call MD for:  severe uncontrolled pain   Complete by: As directed     Call MD for:  temperature >100.4   Complete by: As directed    Diet - low sodium heart healthy   Complete by: As directed    Increase activity slowly   Complete by: As directed       Allergies as of 12/25/2020       Reactions   Buspar [buspirone] Nausea Only   Dizziness (also) The patient does not think it was problem.  He is willing to try it again   Seroquel [quetiapine Fumarate]    Sleepy, vivid dreams        Medication List     STOP taking these medications    doxycycline 100 MG tablet Commonly known as: VIBRA-TABS   propranolol 20 MG tablet Commonly known as: INDERAL       TAKE these medications    busPIRone 10 MG tablet Commonly known as: BUSPAR Take 10 mg by mouth 2 (two) times daily. What changed: Another medication with the same name was removed. Continue taking this medication, and follow the directions you see here.   naloxone 4 MG/0.1ML Liqd nasal spray kit Commonly known as: NARCAN Place 0.4 mg into the nose once.   naltrexone 50 MG tablet Commonly known as: DEPADE Take 1 tablet (50 mg total) by mouth daily for 7 days.   QUEtiapine 100 MG tablet Commonly known as: SEROQUEL Take by mouth.   sertraline 50 MG tablet Commonly known as: ZOLOFT Take 50 mg by mouth daily.        Follow-up Information     Plotnikov, Evie Lacks, MD. Schedule an appointment as soon as possible for a visit in 1 week(s).   Specialty: Internal Medicine Contact information: [place] [address] [phone]                Allergies  Allergen Reactions   Buspar [Buspirone] Nausea Only    Dizziness (also) The patient does not think it was problem.  He is willing to try it again   Seroquel [Quetiapine Fumarate]     Sleepy, vivid dreams    Consultations: Behavioral health   Procedures/Studies: No results found.   Subjective: Patient seen examined at bedside, resting comfortably.  Feels good this morning and ready for  discharge home.  Electrolyte disturbances now resolved.  Tolerating diet without issue.  Once again encouraged him complete cessation from illicit drugs, states he found a sponsor to help him through this; which is a Theme park manager and friend.  No other questions or concerns at this time.  Denies headache, no fever/chills/night sweats, no nausea/vomiting/diarrhea, no chest pain, palpitations, no shortness of breath, no abdominal pain, no weakness, no fatigue, no paresthesias.  No acute events overnight per nursing staff.  Discharge Exam: Vitals:   12/24/20 2015 12/25/20 0732  BP: 132/80   Pulse: 73 80  Resp: 18   Temp: 98.4 F (36.9 C)   SpO2: 100%    Vitals:   12/24/20 0545 12/24/20 1200 12/24/20 2015 12/25/20 0732  BP: 109/71 99/69 132/80   Pulse: 77 79 73 80  Resp: _0 Temp: 98.4 F (36.9 C) 98 F (36.7 C) 98.4 F (36.9 C)   TempSrc: Oral Oral Oral   SpO2: 100% 100% 100%   Weight:      Height:        General: Pt is alert, awake, not in acute distress Cardiovascular: RRR,  S1/S2 +, no rubs, no gallops Respiratory: CTA bilaterally, no wheezing, no rhonchi Abdominal: Soft, NT, ND, bowel sounds + Extremities: no edema, no cyanosis    The results of significant diagnostics from this hospitalization (including imaging, microbiology, ancillary and laboratory) are listed below for reference.     Microbiology: Recent Results (from the past 240 hour(s))  Resp Panel by RT-PCR (Flu A&B, Covid) Nasopharyngeal Swab     Status: None   Collection Time: 12/23/20 12:23 AM   Specimen: Nasopharyngeal Swab; Nasopharyngeal(NP) swabs in vial transport medium  Result Value Ref Range Status   SARS Coronavirus 2 by RT PCR NEGATIVE NEGATIVE Final    Comment: (NOTE) SARS-CoV-2 target nucleic acids are NOT DETECTED.  The SARS-CoV-2 RNA is generally detectable in upper respiratory specimens during the acute phase of infection. The lowest concentration of SARS-CoV-2 viral copies this assay can  detect is 138 copies/mL. A negative result does not preclude SARS-Cov-2 infection and should not be used as the sole basis for treatment or other patient management decisions. A negative result may occur with  improper specimen collection/handling, submission of specimen other than nasopharyngeal swab, presence of viral mutation(s) within the areas targeted by this assay, and inadequate number of viral copies(<138 copies/mL). A negative result must be combined with clinical observations, patient history, and epidemiological information. The expected result is Negative.  Fact Sheet for Patients:  EntrepreneurPulse.com.au  Fact Sheet for Healthcare Providers:  IncredibleEmployment.be  This test is no t yet approved or cleared by the [place] FDA and  has been authorized for detection and/or diagnosis of SARS-CoV-2 by FDA under an Emergency Use Authorization (EUA). This EUA will remain  in effect (meaning this test can be used) for the duration of the COVID-19 declaration under Section 564(b)(1) of the Act, 21 U.S.C.section 360bbb-3(b)(1), unless the authorization is terminated  or revoked sooner.       Influenza A by PCR NEGATIVE NEGATIVE Final   Influenza B by PCR NEGATIVE NEGATIVE Final    Comment: (NOTE) The Xpert Xpress SARS-CoV-2/FLU/RSV plus assay is intended as an aid in the diagnosis of influenza from Nasopharyngeal swab specimens and should not be used as a sole basis for treatment. Nasal washings and aspirates are unacceptable for Xpert Xpress SARS-CoV-2/FLU/RSV testing.  Fact Sheet for Patients: EntrepreneurPulse.com.au  Fact Sheet for Healthcare Providers: IncredibleEmployment.be  This test is not yet approved or cleared by the [place] FDA and has been authorized for detection and/or diagnosis of SARS-CoV-2 by FDA under an Emergency Use Authorization (EUA). This EUA will remain in  effect (meaning this test can be used) for the duration of the COVID-19 declaration under Section 564(b)(1) of the Act, 21 U.S.C. section 360bbb-3(b)(1), unless the authorization is terminated or revoked.  Performed at [facility], [address], [place], [place] [address]      Labs: BNP (last 3 results) No results for input(s): BNP in the last 8760 hours. Basic Metabolic Panel: Recent Labs  Lab 12/22/20 2206 12/23/20 0445 12/23/20 1052 12/24/20 0537 12/25/20 0546  NA 129* 129* 129* 133* 136  K 2.0* 2.3* 2.9* 3.4* 3.7  CL 76* 77* 80* 96* 103  CO2 30 37* 36* 29 27  GLUCOSE 167* 127* 135* 98 105*  BUN 44* 50* 47* 34* 21*  CREATININE 2.08* 2.16* 1.59* 0.99 0.98  CALCIUM 9.7 8.4* 8.3* 8.3* 8.6*  MG 2.7* 2.4  --   --  2.5*   Liver Function Tests: Recent Labs  Lab 12/22/20 2206 12/23/20 0445  AST 37 24  ALT 23 18  ALKPHOS 69 57  BILITOT 3.1* 2.1*  PROT 8.5* 7.2  ALBUMIN 5.3* 4.4   No results for input(s): LIPASE, AMYLASE in the last 168 hours. No results for input(s): AMMONIA in the last 168 hours. CBC: Recent Labs  Lab 12/22/20 2206 12/23/20 0445  WBC 15.4* 12.6*  NEUTROABS 12.5* 9.4*  HGB 17.2* 15.4  HCT 47.0 43.3  MCV 81.0 83.1  PLT 465* 322   Cardiac Enzymes: No results for input(s): CKTOTAL, CKMB, CKMBINDEX, TROPONINI in the last 168 hours. BNP: Invalid input(s): POCBNP CBG: Recent Labs  Lab 12/22/20 2211  GLUCAP 172*   D-Dimer No results for input(s): DDIMER in the last 72 hours. Hgb A1c No results for input(s): HGBA1C in the last 72 hours. Lipid Profile No results for input(s): CHOL, HDL, LDLCALC, TRIG, CHOLHDL, LDLDIRECT in the last 72 hours. Thyroid function studies No results for input(s): TSH, T4TOTAL, T3FREE, THYROIDAB in the last 72 hours.  Invalid input(s): FREET3 Anemia work up No results for input(s): VITAMINB12, FOLATE, FERRITIN, TIBC, IRON, RETICCTPCT in the last 72 hours. Urinalysis    Component Value Date/Time    COLORURINE YELLOW 12/20/2018 1706   APPEARANCEUR CLEAR 12/20/2018 1706   LABSPEC >=1.030 (A) 12/20/2018 1706   PHURINE 6.0 12/20/2018 1706   GLUCOSEU NEGATIVE 12/20/2018 1706   HGBUR NEGATIVE 12/20/2018 1706   BILIRUBINUR SMALL (A) 12/20/2018 1706   KETONESUR NEGATIVE 12/20/2018 1706   PROTEINUR 30 (A) 07/18/2018 1703   UROBILINOGEN 0.2 12/20/2018 1706   NITRITE NEGATIVE 12/20/2018 1706   LEUKOCYTESUR NEGATIVE 12/20/2018 1706   Sepsis Labs Invalid input(s): PROCALCITONIN,  WBC,  LACTICIDVEN Microbiology Recent Results (from the past 240 hour(s))  Resp Panel by RT-PCR (Flu A&B, Covid) Nasopharyngeal Swab     Status: None   Collection Time: 12/23/20 12:23 AM   Specimen: Nasopharyngeal Swab; Nasopharyngeal(NP) swabs in vial transport medium  Result Value Ref Range Status   SARS Coronavirus 2 by RT PCR NEGATIVE NEGATIVE Final    Comment: (NOTE) SARS-CoV-2 target nucleic acids are NOT DETECTED.  The SARS-CoV-2 RNA is generally detectable in upper respiratory specimens during the acute phase of infection. The lowest concentration of SARS-CoV-2 viral copies this assay can detect is 138 copies/mL. A negative result does not preclude SARS-Cov-2 infection and should not be used as the sole basis for treatment or other patient management decisions. A negative result may occur with  improper specimen collection/handling, submission of specimen other than nasopharyngeal swab, presence of viral mutation(s) within the areas targeted by this assay, and inadequate number of viral copies(<138 copies/mL). A negative result must be combined with clinical observations, patient history, and epidemiological information. The expected result is Negative.  Fact Sheet for Patients:  EntrepreneurPulse.com.au  Fact Sheet for Healthcare Providers:  IncredibleEmployment.be  This test is no t yet approved or cleared by the [place] FDA and  has been authorized for  detection and/or diagnosis of SARS-CoV-2 by FDA under an Emergency Use Authorization (EUA). This EUA will remain  in effect (meaning this test can be used) for the duration of the COVID-19 declaration under Section 564(b)(1) of the Act, 21 U.S.C.section 360bbb-3(b)(1), unless the authorization is terminated  or revoked sooner.       Influenza A by PCR NEGATIVE NEGATIVE Final   Influenza B by PCR NEGATIVE NEGATIVE Final    Comment: (NOTE) The Xpert Xpress SARS-CoV-2/FLU/RSV plus assay is intended as an aid in the diagnosis of influenza from Nasopharyngeal swab specimens and  should not be used as a sole basis for treatment. Nasal washings and aspirates are unacceptable for Xpert Xpress SARS-CoV-2/FLU/RSV testing.  Fact Sheet for Patients: EntrepreneurPulse.com.au  Fact Sheet for Healthcare Providers: IncredibleEmployment.be  This test is not yet approved or cleared by the [place] FDA and has been authorized for detection and/or diagnosis of SARS-CoV-2 by FDA under an Emergency Use Authorization (EUA). This EUA will remain in effect (meaning this test can be used) for the duration of the COVID-19 declaration under Section 564(b)(1) of the Act, 21 U.S.C. section 360bbb-3(b)(1), unless the authorization is terminated or revoked.  Performed at [facility], [address], [place], [place] [address]      Time coordinating discharge: Over 30 minutes  SIGNED:   Idolina Mantell J [place] DO  [facility] 12/25/2020, 9:22 AM

## 2020-12-25 NOTE — Progress Notes (Signed)
Patient discharged via private vehicle. Nicholas Harrison is ride. Patient verbalized an understanding of discharge instructions including medications. Per patient he has already set up outpatient appointment.

## 2021-03-08 ENCOUNTER — Telehealth: Payer: Self-pay | Admitting: Internal Medicine

## 2021-03-08 NOTE — Telephone Encounter (Signed)
Patient's mother informing provider patient test covid + on 12-16  Patient has a vv on 12-20

## 2021-03-09 ENCOUNTER — Telehealth (INDEPENDENT_AMBULATORY_CARE_PROVIDER_SITE_OTHER): Payer: BC Managed Care – PPO | Admitting: Family Medicine

## 2021-03-09 ENCOUNTER — Other Ambulatory Visit: Payer: Self-pay | Admitting: Family Medicine

## 2021-03-09 ENCOUNTER — Encounter: Payer: Self-pay | Admitting: Family Medicine

## 2021-03-09 DIAGNOSIS — U071 COVID-19: Secondary | ICD-10-CM

## 2021-03-09 MED ORDER — ALBUTEROL SULFATE HFA 108 (90 BASE) MCG/ACT IN AERS: 2.0000 | INHALATION_SPRAY | Freq: Four times a day (QID) | RESPIRATORY_TRACT | 0 refills | Status: DC | PRN

## 2021-03-09 MED ORDER — BENZONATATE 100 MG PO CAPS: ORAL_CAPSULE | ORAL | 0 refills | Status: DC

## 2021-03-09 NOTE — Patient Instructions (Signed)
°  HOME CARE TIPS:  -I sent the medication(s) we discussed to your pharmacy: Meds ordered this encounter  Medications   benzonatate (TESSALON PERLES) 100 MG capsule    Sig: 1-2 capsules up to twice daily as needed for cough    Dispense:  30 capsule    Refill:  0   albuterol (PROAIR HFA) 108 (90 Base) MCG/ACT inhaler    Sig: Inhale 2 puffs into the lungs every 6 (six) hours as needed for wheezing or shortness of breath.    Dispense:  1 each    Refill:  0     -can use tylenol or aleve if needed for fevers, aches and pains per instructions  -can use nasal saline a few times per day if you have nasal congestion; sometimes  a short course of Afrin nasal spray for 3 days can help with symptoms as well  -stay hydrated, drink plenty of fluids and eat small healthy meals - avoid dairy  -can take 1000 IU ( ) Vit D3 and 100-500 mg of Vit C daily per instructions  -If the Covid test is positive, check out the [facility] website for more information on home care, transmission and treatment for COVID19  -follow up with your doctor in 2-3 days unless improving and feeling better  -stay home while sick, except to seek medical care. If you have COVID19, you will likely be contagious for 7-10 days. Flu or Influenza is likely contagious for about 7 days. Other respiratory viral infections remain contagious for 5-10+ days depending on the virus and many other factors. Wear a good mask that fits snugly (such as N95 or KN95) if around others to reduce the risk of transmission.  It was nice to meet you today, and I really hope you are feeling better soon. I help [place] out with telemedicine visits on Tuesdays and Thursdays and am happy to help if you need a follow up virtual visit on those days. Otherwise, if you have any concerns or questions following this visit please schedule a follow up visit with your Primary Care doctor or seek care at a local urgent care clinic to avoid delays in care.    Seek in  person care or schedule a follow up video visit promptly if your symptoms worsen, new concerns arise or you are not improving with treatment. Call 911 and/or seek emergency care if your symptoms are severe or life threatening.

## 2021-03-09 NOTE — Progress Notes (Signed)
Virtual Visit via Video Note  I connected with Nicholas Harrison)  on 03/09/21 at 11:00 AM EST by a video enabled telemedicine application and verified that I am speaking with the correct person using two identifiers.  Location patient: home, [place] Location provider:work or home office Persons participating in the virtual visit: patient, provider  I discussed the limitations of evaluation and management by telemedicine and the availability of in person appointments. The patient expressed understanding and agreed to proceed.   HPI:  Acute telemedicine visit for Covid19: -Onset: about 6 days ago; tested positive about 4 days ago -Symptoms include: fevers and body aches initially, nasal congestion, cough, occ wheezing -doing better today - still has a sore throat, cough and congestion -Denies: CP (except when coughs), SOB, NVD, inability to eat/drink/get out of bed -Has tried:musinex -Pertinent past medical history: see below -Pertinent medication allergies: No Known Allergies -COVID-19 vaccine status: had two doses mrna vaccine Immunization History  Administered Date(s) Administered   Influenza, Seasonal, Injecte, Preservative Fre 02/15/2012   Influenza,inj,Quad PF,6+ Mos 02/18/2013, 12/20/2018   Influenza-Unspecified 01/17/2018, 12/08/2020    ROS: See pertinent positives and negatives per HPI.  Past Medical History:  Diagnosis Date   Anxiety    Depression    Drug-induced seizure (HCC)    Seizures (HCC)     Past Surgical History:  Procedure Laterality Date   HAND SURGERY       Current Outpatient Medications:    albuterol (PROAIR HFA) 108 (90 Base) MCG/ACT inhaler, Inhale 2 puffs into the lungs every 6 (six) hours as needed for wheezing or shortness of breath., Disp: 1 each, Rfl: 0   benzonatate (TESSALON PERLES) 100 MG capsule, 1-2 capsules up to twice daily as needed for cough, Disp: 30 capsule, Rfl: 0   busPIRone (BUSPAR) 10 MG tablet, Take 10 mg by mouth 2 (two) times daily.,  Disp: , Rfl:    Naltrexone 380 MG SUSR, Inject into the muscle., Disp: , Rfl:    propranolol (INDERAL) 20 MG tablet, Take 20 mg by mouth 3 (three) times daily., Disp: , Rfl:    QUEtiapine (SEROQUEL) 100 MG tablet, Take by mouth., Disp: , Rfl:    sertraline (ZOLOFT) 50 MG tablet, Take 50 mg by mouth daily., Disp: , Rfl:   EXAM:  VITALS per patient if applicable:  GENERAL: alert, oriented, appears well and in no acute distress  HEENT: atraumatic, conjunttiva clear, no obvious abnormalities on inspection of external nose and ears  NECK: normal movements of the head and neck  LUNGS: on inspection no signs of respiratory distress, breathing rate appears normal, no obvious gross SOB, gasping or wheezing  CV: no obvious cyanosis  MS: moves all visible extremities without noticeable abnormality  PSYCH/NEURO: pleasant and cooperative, no obvious depression or anxiety, speech and thought processing grossly intact  ASSESSMENT AND PLAN:  Discussed the following assessment and plan:  COVID-19  -we discussed possible serious and likely etiologies, options for evaluation and workup, limitations of telemedicine visit vs in person visit, treatment, treatment risks and precautions. Pt is agreeable to treatment via telemedicine at this moment. Discussed treatment window/indications, potential complications, isolation/contagious period, precautions and care. He opted for tessalon for cough and albuterol inhaler - demonstrated proper use of inhaler.  Work/School slipped offered: declined Advised to seek prompt virtual visit follow up or in person care if worsening, new symptoms arise, or if is not improving with treatment.    I discussed the assessment and treatment plan with the patient. The patient  was provided an opportunity to ask questions and all were answered. The patient agreed with the plan and demonstrated an understanding of the instructions.     Lucretia Kern, DO

## 2021-12-22 ENCOUNTER — Telehealth: Payer: Self-pay | Admitting: *Deleted

## 2021-12-22 NOTE — Telephone Encounter (Signed)
Pt was on cover-[facility]med[facility] need a PA for Vraylar. Pt has not seen MD since 02/2021. Archive PA last saw [facility] due to opioid.Marland KitchenJohny Chess

## 2022-03-18 ENCOUNTER — Encounter (HOSPITAL_COMMUNITY): Payer: Self-pay

## 2022-03-18 ENCOUNTER — Inpatient Hospital Stay (HOSPITAL_COMMUNITY)
Admission: EM | Admit: 2022-03-18 | Discharge: 2022-03-22 | DRG: 896 | Disposition: A | Discharge status: Home / Self Care | Payer: BC Managed Care – PPO | Attending: Family Medicine | Admitting: Family Medicine

## 2022-03-18 DIAGNOSIS — F32A Depression, unspecified: Secondary | ICD-10-CM | POA: Diagnosis present

## 2022-03-18 DIAGNOSIS — R9431 Abnormal electrocardiogram [ECG] [EKG]: Secondary | ICD-10-CM | POA: Diagnosis not present

## 2022-03-18 DIAGNOSIS — Z87891 Personal history of nicotine dependence: Secondary | ICD-10-CM

## 2022-03-18 DIAGNOSIS — R410 Disorientation, unspecified: Principal | ICD-10-CM

## 2022-03-18 DIAGNOSIS — E860 Dehydration: Secondary | ICD-10-CM | POA: Diagnosis present

## 2022-03-18 DIAGNOSIS — F13239 Sedative, hypnotic or anxiolytic dependence with withdrawal, unspecified: Secondary | ICD-10-CM | POA: Diagnosis present

## 2022-03-18 DIAGNOSIS — G40909 Epilepsy, unspecified, not intractable, without status epilepticus: Secondary | ICD-10-CM | POA: Diagnosis present

## 2022-03-18 DIAGNOSIS — Z79899 Other long term (current) drug therapy: Secondary | ICD-10-CM

## 2022-03-18 DIAGNOSIS — E876 Hypokalemia: Secondary | ICD-10-CM | POA: Diagnosis present

## 2022-03-18 DIAGNOSIS — Z818 Family history of other mental and behavioral disorders: Secondary | ICD-10-CM | POA: Diagnosis not present

## 2022-03-18 DIAGNOSIS — F1193 Opioid use, unspecified with withdrawal: Secondary | ICD-10-CM | POA: Diagnosis not present

## 2022-03-18 DIAGNOSIS — R251 Tremor, unspecified: Secondary | ICD-10-CM | POA: Diagnosis present

## 2022-03-18 DIAGNOSIS — Z1152 Encounter for screening for COVID-19: Secondary | ICD-10-CM | POA: Diagnosis not present

## 2022-03-18 DIAGNOSIS — F1123 Opioid dependence with withdrawal: Secondary | ICD-10-CM | POA: Diagnosis present

## 2022-03-18 DIAGNOSIS — R531 Weakness: Secondary | ICD-10-CM

## 2022-03-18 DIAGNOSIS — R4182 Altered mental status, unspecified: Secondary | ICD-10-CM | POA: Diagnosis not present

## 2022-03-18 DIAGNOSIS — Z23 Encounter for immunization: Secondary | ICD-10-CM | POA: Diagnosis not present

## 2022-03-18 DIAGNOSIS — G929 Unspecified toxic encephalopathy: Secondary | ICD-10-CM | POA: Diagnosis not present

## 2022-03-18 DIAGNOSIS — Z833 Family history of diabetes mellitus: Secondary | ICD-10-CM

## 2022-03-18 DIAGNOSIS — G9341 Metabolic encephalopathy: Secondary | ICD-10-CM | POA: Diagnosis not present

## 2022-03-18 DIAGNOSIS — E869 Volume depletion, unspecified: Secondary | ICD-10-CM | POA: Diagnosis present

## 2022-03-18 DIAGNOSIS — F419 Anxiety disorder, unspecified: Secondary | ICD-10-CM | POA: Diagnosis present

## 2022-03-18 DIAGNOSIS — Z888 Allergy status to other drugs, medicaments and biological substances status: Secondary | ICD-10-CM

## 2022-03-18 DIAGNOSIS — G928 Other toxic encephalopathy: Secondary | ICD-10-CM | POA: Diagnosis present

## 2022-03-18 DIAGNOSIS — F1320 Sedative, hypnotic or anxiolytic dependence, uncomplicated: Secondary | ICD-10-CM | POA: Diagnosis present

## 2022-03-18 DIAGNOSIS — E8729 Other acidosis: Secondary | ICD-10-CM | POA: Diagnosis present

## 2022-03-18 LAB — BASIC METABOLIC PANEL
Anion gap: 17 — ABNORMAL HIGH (ref 5–15)
BUN: 40 mg/dL — ABNORMAL HIGH (ref 6–20)
CO2: 26 mmol/L (ref 22–32)
Calcium: 9.6 mg/dL (ref 8.9–10.3)
Chloride: 97 mmol/L — ABNORMAL LOW (ref 98–111)
Creatinine, Ser: 0.95 mg/dL (ref 0.61–1.24)
GFR, Estimated: >60 mL/min (ref >60)
Glucose, Bld: 141 mg/dL — ABNORMAL HIGH (ref 70–99)
Potassium: 2.9 mmol/L — ABNORMAL LOW (ref 3.5–5.1)
Sodium: 140 mmol/L (ref 135–145)

## 2022-03-18 LAB — CBC WITH DIFFERENTIAL/PLATELET
Abs Immature Granulocytes: 0.03 10*3/uL (ref 0.00–0.07)
Basophils Absolute: 0.1 10*3/uL (ref 0.0–0.1)
Basophils Relative: 1 %
Eosinophils Absolute: 0 10*3/uL (ref 0.0–0.5)
Eosinophils Relative: 0 %
HCT: 51.1 % (ref 39.0–52.0)
Hemoglobin: 17.6 g/dL — ABNORMAL HIGH (ref 13.0–17.0)
Immature Granulocytes: 0 %
Lymphocytes Relative: 18 %
Lymphs Abs: 1.9 10*3/uL (ref 0.7–4.0)
MCH: 28.7 pg (ref 26.0–34.0)
MCHC: 34.4 g/dL (ref 30.0–36.0)
MCV: 83.2 fL (ref 80.0–100.0)
Monocytes Absolute: 1.3 10*3/uL — ABNORMAL HIGH (ref 0.1–1.0)
Monocytes Relative: 12 %
Neutro Abs: 7.5 10*3/uL (ref 1.7–7.7)
Neutrophils Relative %: 69 %
Platelets: 391 10*3/uL (ref 150–400)
RBC: 6.14 MIL/uL — ABNORMAL HIGH (ref 4.22–5.81)
RDW: 13.5 % (ref 11.5–15.5)
WBC: 10.8 10*3/uL — ABNORMAL HIGH (ref 4.0–10.5)
nRBC: 0 % (ref 0.0–0.2)

## 2022-03-18 LAB — HEPATIC FUNCTION PANEL
ALT: 16 U/L (ref 0–44)
AST: 21 U/L (ref 15–41)
Albumin: 5.2 g/dL — ABNORMAL HIGH (ref 3.5–5.0)
Alkaline Phosphatase: 55 U/L (ref 38–126)
Bilirubin, Direct: 0.2 mg/dL (ref 0.0–0.2)
Indirect Bilirubin: 1.6 mg/dL — ABNORMAL HIGH (ref 0.3–0.9)
Total Bilirubin: 1.8 mg/dL — ABNORMAL HIGH (ref 0.3–1.2)
Total Protein: 8.7 g/dL — ABNORMAL HIGH (ref 6.5–8.1)

## 2022-03-18 LAB — ETHANOL: Alcohol, Ethyl (B): <10 mg/dL (ref <10)

## 2022-03-18 LAB — MAGNESIUM: Magnesium: 3 mg/dL — ABNORMAL HIGH (ref 1.7–2.4)

## 2022-03-18 MED ORDER — ONDANSETRON 4 MG PO TBDP
4.0000 mg | ORAL_TABLET | Freq: Once | ORAL | Status: AC
  Administered 2022-03-18: 4 mg via ORAL
  Filled 2022-03-18: qty 1

## 2022-03-18 NOTE — ED Triage Notes (Signed)
Pt last used fentanyl 4-5 days ago but mother is concerned pt is still nauseous and vomiting and is afraid he is dehydrated.

## 2022-03-18 NOTE — ED Provider Triage Note (Cosign Needed)
Emergency Medicine Provider Triage Evaluation Note  Nicholas Harrison , a 31 y.o. male  was evaluated in triage.  Pt complains of withdrawal from fentanyl, Xanax, and Adderall.  He last took these medications 5 days ago.  He is here accompanied by his mother.  He reports uncontrollable shaking, nausea, vomiting.  Mother is concerned that he may be dehydrated.  He has been admitted into the hospital in the past for opioid withdrawal.  Mother states he completed an inpatient treatment program at [facility] in 2021, he was free from drugs for 4 to 5 months.  He was admitted here in October 2022 for opioid withdrawal.  He denies chest pain or shortness of breath and abdominal pain.  Mother states she began to notice symptoms Christmas Day.  She also states that he appears confused and slower to respond to questions, she noticed a rash to his upper chest and both shoulders.  Review of Systems  Positive: Chills, nausea, vomiting, rash Negative: Chest pain, shortness of breath fever  Physical Exam  BP (!) 158/96 (BP Location: Right Arm)   Pulse 100   Temp 98.4 F (36.9 C) (Oral)   Resp 18   Ht 5\' 9"  (1.753 m)   Wt 77.1 kg   SpO2 100%   BMI 25.10 kg/m  Gen:   Awake, tremulous Resp:  Normal effort  MSK:   Moves extremities without difficulty  Other:  Mucous membranes are moist Medical Decision Making  Medically screening exam initiated at 8:33 PM.  Appropriate orders placed.  was informed that the remainder of the evaluation will be completed by another provider, this initial triage assessment does not replace that evaluation, and the importance of remaining in the ED until their evaluation is complete.    Daisy Blossom, PA-C 03/18/22 2047

## 2022-03-19 ENCOUNTER — Encounter (HOSPITAL_COMMUNITY): Payer: Self-pay | Admitting: Internal Medicine

## 2022-03-19 ENCOUNTER — Emergency Department (HOSPITAL_COMMUNITY): Payer: BC Managed Care – PPO

## 2022-03-19 ENCOUNTER — Other Ambulatory Visit: Payer: Self-pay

## 2022-03-19 DIAGNOSIS — Z23 Encounter for immunization: Secondary | ICD-10-CM | POA: Diagnosis not present

## 2022-03-19 DIAGNOSIS — Z87891 Personal history of nicotine dependence: Secondary | ICD-10-CM | POA: Diagnosis not present

## 2022-03-19 DIAGNOSIS — G9341 Metabolic encephalopathy: Secondary | ICD-10-CM | POA: Diagnosis not present

## 2022-03-19 DIAGNOSIS — E876 Hypokalemia: Secondary | ICD-10-CM | POA: Diagnosis present

## 2022-03-19 DIAGNOSIS — Z833 Family history of diabetes mellitus: Secondary | ICD-10-CM | POA: Diagnosis not present

## 2022-03-19 DIAGNOSIS — F1193 Opioid use, unspecified with withdrawal: Secondary | ICD-10-CM | POA: Diagnosis not present

## 2022-03-19 DIAGNOSIS — Z79899 Other long term (current) drug therapy: Secondary | ICD-10-CM | POA: Diagnosis not present

## 2022-03-19 DIAGNOSIS — Z888 Allergy status to other drugs, medicaments and biological substances status: Secondary | ICD-10-CM | POA: Diagnosis not present

## 2022-03-19 DIAGNOSIS — E869 Volume depletion, unspecified: Secondary | ICD-10-CM | POA: Diagnosis present

## 2022-03-19 DIAGNOSIS — G40909 Epilepsy, unspecified, not intractable, without status epilepticus: Secondary | ICD-10-CM | POA: Diagnosis present

## 2022-03-19 DIAGNOSIS — F13239 Sedative, hypnotic or anxiolytic dependence with withdrawal, unspecified: Secondary | ICD-10-CM | POA: Diagnosis present

## 2022-03-19 DIAGNOSIS — E8729 Other acidosis: Secondary | ICD-10-CM | POA: Diagnosis present

## 2022-03-19 DIAGNOSIS — R410 Disorientation, unspecified: Secondary | ICD-10-CM | POA: Diagnosis present

## 2022-03-19 DIAGNOSIS — Z818 Family history of other mental and behavioral disorders: Secondary | ICD-10-CM | POA: Diagnosis not present

## 2022-03-19 DIAGNOSIS — F32A Depression, unspecified: Secondary | ICD-10-CM | POA: Diagnosis present

## 2022-03-19 DIAGNOSIS — R4182 Altered mental status, unspecified: Secondary | ICD-10-CM | POA: Diagnosis not present

## 2022-03-19 DIAGNOSIS — G929 Unspecified toxic encephalopathy: Secondary | ICD-10-CM | POA: Diagnosis present

## 2022-03-19 DIAGNOSIS — F419 Anxiety disorder, unspecified: Secondary | ICD-10-CM | POA: Diagnosis present

## 2022-03-19 DIAGNOSIS — R251 Tremor, unspecified: Secondary | ICD-10-CM | POA: Diagnosis present

## 2022-03-19 DIAGNOSIS — Z1152 Encounter for screening for COVID-19: Secondary | ICD-10-CM | POA: Diagnosis not present

## 2022-03-19 DIAGNOSIS — G928 Other toxic encephalopathy: Secondary | ICD-10-CM | POA: Diagnosis present

## 2022-03-19 DIAGNOSIS — E860 Dehydration: Secondary | ICD-10-CM | POA: Diagnosis present

## 2022-03-19 DIAGNOSIS — F1123 Opioid dependence with withdrawal: Secondary | ICD-10-CM | POA: Diagnosis present

## 2022-03-19 LAB — URINALYSIS, COMPLETE (UACMP) WITH MICROSCOPIC
Bacteria, UA: NONE SEEN
Bilirubin Urine: NEGATIVE
Glucose, UA: NEGATIVE mg/dL
Hgb urine dipstick: NEGATIVE
Ketones, ur: 20 mg/dL — AB
Leukocytes,Ua: NEGATIVE
Nitrite: NEGATIVE
Protein, ur: 100 mg/dL — AB
Specific Gravity, Urine: 1.029 (ref 1.005–1.030)
pH: 7 (ref 5.0–8.0)

## 2022-03-19 LAB — RESP PANEL BY RT-PCR (RSV, FLU A&B, COVID)  RVPGX2
Influenza A by PCR: NEGATIVE
Influenza B by PCR: NEGATIVE
Resp Syncytial Virus by PCR: NEGATIVE
SARS Coronavirus 2 by RT PCR: NEGATIVE

## 2022-03-19 LAB — RAPID URINE DRUG SCREEN, HOSP PERFORMED
Amphetamines: NOT DETECTED
Barbiturates: NOT DETECTED
Benzodiazepines: NOT DETECTED
Cocaine: NOT DETECTED
Opiates: NOT DETECTED
Tetrahydrocannabinol: POSITIVE — AB

## 2022-03-19 LAB — CK: Total CK: 391 U/L (ref 49–397)

## 2022-03-19 LAB — FOLATE: Folate: 18.3 ng/mL (ref >5.9)

## 2022-03-19 LAB — TSH: TSH: 4.474 u[IU]/mL (ref 0.350–4.500)

## 2022-03-19 LAB — BILIRUBIN, FRACTIONATED(TOT/DIR/INDIR)
Bilirubin, Direct: 0.2 mg/dL (ref 0.0–0.2)
Indirect Bilirubin: 1.7 mg/dL — ABNORMAL HIGH (ref 0.3–0.9)
Total Bilirubin: 1.9 mg/dL — ABNORMAL HIGH (ref 0.3–1.2)

## 2022-03-19 LAB — VITAMIN B12: Vitamin B-12: 197 pg/mL (ref 180–914)

## 2022-03-19 LAB — AMMONIA: Ammonia: 36 umol/L — ABNORMAL HIGH (ref 9–35)

## 2022-03-19 MED ORDER — CLONIDINE HCL 0.1 MG PO TABS
0.1000 mg | ORAL_TABLET | ORAL | Status: DC
  Administered 2022-03-21 – 2022-03-22 (×3): 0.1 mg via ORAL
  Filled 2022-03-19 (×3): qty 1

## 2022-03-19 MED ORDER — LORAZEPAM 1 MG PO TABS
1.0000 mg | ORAL_TABLET | Freq: Four times a day (QID) | ORAL | Status: DC | PRN
  Administered 2022-03-20: 1 mg via ORAL
  Filled 2022-03-19: qty 1

## 2022-03-19 MED ORDER — PROCHLORPERAZINE EDISYLATE 10 MG/2ML IJ SOLN: 5.0000 mg | Freq: Four times a day (QID) | INTRAMUSCULAR | Status: DC | PRN

## 2022-03-19 MED ORDER — LORAZEPAM 1 MG PO TABS: 1.0000 mg | ORAL_TABLET | Freq: Every day | ORAL | Status: DC

## 2022-03-19 MED ORDER — NAPROXEN 250 MG PO TABS: 500.0000 mg | ORAL_TABLET | Freq: Two times a day (BID) | ORAL | Status: DC | PRN

## 2022-03-19 MED ORDER — METHOCARBAMOL 500 MG PO TABS: 500.0000 mg | ORAL_TABLET | Freq: Three times a day (TID) | ORAL | Status: DC | PRN

## 2022-03-19 MED ORDER — ENOXAPARIN SODIUM 40 MG/0.4ML IJ SOSY
40.0000 mg | PREFILLED_SYRINGE | INTRAMUSCULAR | Status: DC
  Administered 2022-03-19 – 2022-03-21 (×3): 40 mg via SUBCUTANEOUS
  Filled 2022-03-19 (×3): qty 0.4

## 2022-03-19 MED ORDER — ACETAMINOPHEN 325 MG PO TABS
650.0000 mg | ORAL_TABLET | Freq: Four times a day (QID) | ORAL | Status: DC | PRN
  Administered 2022-03-20 – 2022-03-21 (×3): 650 mg via ORAL
  Filled 2022-03-19 (×3): qty 2

## 2022-03-19 MED ORDER — HYDROXYZINE HCL 25 MG PO TABS
25.0000 mg | ORAL_TABLET | Freq: Four times a day (QID) | ORAL | Status: DC | PRN
  Administered 2022-03-19: 25 mg via ORAL
  Filled 2022-03-19: qty 1

## 2022-03-19 MED ORDER — LORAZEPAM 1 MG PO TABS: 1.0000 mg | ORAL_TABLET | Freq: Three times a day (TID) | ORAL | Status: DC

## 2022-03-19 MED ORDER — DICYCLOMINE HCL 10 MG PO CAPS: 20.0000 mg | ORAL_CAPSULE | Freq: Four times a day (QID) | ORAL | Status: DC | PRN

## 2022-03-19 MED ORDER — CLONIDINE HCL 0.1 MG PO TABS: 0.1000 mg | ORAL_TABLET | Freq: Three times a day (TID) | ORAL | 0 refills | Status: DC | PRN

## 2022-03-19 MED ORDER — THIAMINE HCL 100 MG/ML IJ SOLN
100.0000 mg | Freq: Once | INTRAMUSCULAR | Status: AC
  Administered 2022-03-19: 100 mg via INTRAMUSCULAR
  Filled 2022-03-19: qty 2

## 2022-03-19 MED ORDER — LACTATED RINGERS IV SOLN: INTRAVENOUS | Status: DC

## 2022-03-19 MED ORDER — SODIUM CHLORIDE 0.9 % IV BOLUS
1000.0000 mL | Freq: Once | INTRAVENOUS | Status: AC
  Administered 2022-03-19: 1000 mL via INTRAVENOUS

## 2022-03-19 MED ORDER — LORAZEPAM 1 MG PO TABS: 1.0000 mg | ORAL_TABLET | Freq: Four times a day (QID) | ORAL | Status: DC

## 2022-03-19 MED ORDER — LOPERAMIDE HCL 2 MG PO CAPS: 2.0000 mg | ORAL_CAPSULE | ORAL | Status: DC | PRN

## 2022-03-19 MED ORDER — POTASSIUM CHLORIDE 20 MEQ PO PACK
60.0000 meq | PACK | ORAL | Status: AC
  Administered 2022-03-19: 60 meq via ORAL
  Filled 2022-03-19: qty 3

## 2022-03-19 MED ORDER — CLONIDINE HCL 0.1 MG PO TABS: 0.1000 mg | ORAL_TABLET | Freq: Every day | ORAL | Status: DC

## 2022-03-19 MED ORDER — LORAZEPAM 1 MG PO TABS: 1.0000 mg | ORAL_TABLET | Freq: Two times a day (BID) | ORAL | Status: DC

## 2022-03-19 MED ORDER — HYDROXYZINE HCL 25 MG PO TABS: 25.0000 mg | ORAL_TABLET | Freq: Four times a day (QID) | ORAL | Status: DC | PRN

## 2022-03-19 MED ORDER — THIAMINE MONONITRATE 100 MG PO TABS
100.0000 mg | ORAL_TABLET | Freq: Every day | ORAL | Status: DC
  Administered 2022-03-20 – 2022-03-22 (×3): 100 mg via ORAL
  Filled 2022-03-19 (×3): qty 1

## 2022-03-19 MED ORDER — CLONIDINE HCL 0.1 MG PO TABS
0.1000 mg | ORAL_TABLET | Freq: Four times a day (QID) | ORAL | Status: AC
  Administered 2022-03-19 – 2022-03-20 (×7): 0.1 mg via ORAL
  Filled 2022-03-19 (×7): qty 1

## 2022-03-19 MED ORDER — ACETAMINOPHEN 650 MG RE SUPP: 650.0000 mg | Freq: Four times a day (QID) | RECTAL | Status: DC | PRN

## 2022-03-19 MED ORDER — ADULT MULTIVITAMIN W/MINERALS CH
1.0000 | ORAL_TABLET | Freq: Every day | ORAL | Status: DC
  Administered 2022-03-19 – 2022-03-22 (×4): 1 via ORAL
  Filled 2022-03-19 (×4): qty 1

## 2022-03-19 NOTE — ED Notes (Signed)
Pt drank entire cup of water, pt given warm blanket

## 2022-03-19 NOTE — ED Notes (Signed)
Mother [name] would like to be notified at [phone] when patient has bed and is being transferred to [facility].

## 2022-03-19 NOTE — ED Provider Notes (Addendum)
[facility] EMERGENCY DEPARTMENT Provider Note   CSN: [id number] Arrival date & time: 03/18/22  1450     History  Chief Complaint  Patient presents with   Withdrawal    Pt last used fentanyl 4-5 days ago but mother is concerned pt is still nauseous and vomiting and is afraid he is dehydrated.    Nicholas Harrison is a 31 y.o. male.  Patient is a 31 year old male with past medical history of polysubstance abuse, depression, seizure disorder.  Patient brought by mom for evaluation of withdrawal.  Patient reports using fentanyl, Xanax, and Adderall that he buys on the street.  He reports stopping these 5 days ago.  Since that time he has had shaking, nausea, vomiting, and was brought by mom over concerns of dehydration.  Patient denies fevers or chills.  The history is provided by the patient.       Home Medications Prior to Admission medications   Medication Sig Start Date End Date Taking? Authorizing Provider  albuterol (VENTOLIN HFA) 108 (90 Base) MCG/ACT inhaler INHALE 2 PUFFS INTO THE LUNGS EVERY 6 HOURS AS NEEDED FOR WHEEZING OR SHORTNESS OF BREATH 03/09/21   Lucretia Kern, DO  benzonatate (TESSALON PERLES) 100 MG capsule 1-2 capsules up to twice daily as needed for cough 03/09/21   Colin Benton R, DO  busPIRone (BUSPAR) 10 MG tablet Take 10 mg by mouth 2 (two) times daily. 12/11/19   [provider]  Naltrexone 380 MG SUSR Inject into the muscle. 12/28/20   [provider]  propranolol (INDERAL) 20 MG tablet Take 20 mg by mouth 3 (three) times daily. 02/18/21   [provider]  QUEtiapine (SEROQUEL) 100 MG tablet Take by mouth. 01/27/20   [provider]  sertraline (ZOLOFT) 50 MG tablet Take 50 mg by mouth daily. 01/27/20   [provider]      Allergies    Patient has no known allergies.    Review of Systems   Review of Systems  All other systems reviewed and are negative.   Physical Exam Updated Vital Signs BP (!) 154/107  (BP Location: Right Arm)   Pulse 97   Temp 98.5 F (36.9 C) (Oral)   Resp 16   Ht 5\' 9"  (1.753 m)   Wt 77.1 kg   SpO2 100%   BMI 25.10 kg/m  Physical Exam Vitals and nursing note reviewed.  Constitutional:      General: He is not in acute distress.    Appearance: He is well-developed. He is not diaphoretic.  HENT:     Head: Normocephalic and atraumatic.  Cardiovascular:     Rate and Rhythm: Normal rate and regular rhythm.     Heart sounds: No murmur heard.    No friction rub.  Pulmonary:     Effort: Pulmonary effort is normal. No respiratory distress.     Breath sounds: Normal breath sounds. No wheezing or rales.  Abdominal:     General: Bowel sounds are normal. There is no distension.     Palpations: Abdomen is soft.     Tenderness: There is no abdominal tenderness.  Musculoskeletal:        General: Normal range of motion.     Cervical back: Normal range of motion and neck supple.  Skin:    General: Skin is warm and dry.  Neurological:     Mental Status: He is alert and oriented to person, place, and time.     Coordination: Coordination normal.  ED Results / Procedures / Treatments   Labs (all labs ordered are listed, but only abnormal results are displayed) Labs Reviewed  CBC WITH DIFFERENTIAL/PLATELET - Abnormal; Notable for the following components:      Result Value   WBC 10.8 (*)    RBC 6.14 (*)    Hemoglobin 17.6 (*)    Monocytes Absolute 1.3 (*)    All other components within normal limits  BASIC METABOLIC PANEL - Abnormal; Notable for the following components:   Potassium 2.9 (*)    Chloride 97 (*)    Glucose, Bld 141 (*)    BUN 40 (*)    Anion gap 17 (*)    All other components within normal limits  HEPATIC FUNCTION PANEL - Abnormal; Notable for the following components:   Total Protein 8.7 (*)    Albumin 5.2 (*)    Total Bilirubin 1.8 (*)    Indirect Bilirubin 1.6 (*)    All other components within normal limits  MAGNESIUM - Abnormal;  Notable for the following components:   Magnesium 3.0 (*)    All other components within normal limits  RESP PANEL BY RT-PCR (RSV, FLU A&B, COVID)  [id number]  ETHANOL  RAPID URINE DRUG SCREEN, HOSP PERFORMED    EKG None  Radiology No results found.  Procedures Procedures    Medications Ordered in ED Medications  sodium chloride 0.9 % bolus 1,000 mL (has no administration in time range)  sodium chloride 0.9 % bolus 1,000 mL (has no administration in time range)  ondansetron (ZOFRAN-ODT) disintegrating tablet 4 mg (4 mg Oral Given 03/18/22 2355)    ED Course/ Medical Decision Making/ A&P  Patient with history of polysubstance abuse and substance-induced psychosis presenting with complaints of shakiness, decreased p.o. intake, nausea, vomiting.  Mother is concerned he may be dehydrated.  Patient has little additional history as he is somewhat somnolent and not highly vocal.  Patient arrives here with stable vital signs and appears clinically well.  He does seem somewhat jittery, but exam is otherwise unremarkable.  Laboratory studies initiated including CBC, metabolic panel, hepatic function panel, and EtOH level.  All of these studies are basically unremarkable.  Patient was hydrated with 2 L of normal saline and seems to be feeling better.  He does complain of some withdrawal-like symptoms and will be given a prescription for clonidine.  Resource guide for substance abuse treatment centers given, but patient seems appropriate for discharge.  Discharge attempted, however mother feels that patient is not at his baseline and cannot go home.  He has history of substance-induced psychosis and TTS will be consulted.  Final Clinical Impression(s) / ED Diagnoses Final diagnoses:  None    Rx / DC Orders ED Discharge Orders     None         Geoffery Lyons, MD 03/19/22 9628    Geoffery Lyons, MD 03/19/22 [phone]

## 2022-03-19 NOTE — ED Notes (Signed)
Remains lethargic, in a state of somnolence. Mother at bedside. Provider in to evaluate.

## 2022-03-19 NOTE — Consult Note (Incomplete Revision)
Telepsych Consultation   Reason for Consult:  behavioral changes, history of substance-induced psychosis Referring Physician:  Geoffery Lyonselo, Douglas, MD Location of Patient: [facility] Location of Provider: Behavioral Health TTS Department  Patient Identification: Nicholas Harrison MRN:  [id number] Principal Diagnosis: <principal problem not specified> Diagnosis:  Active Problems:   * No active hospital problems. *   Total Time spent with patient: 45 minutes  Subjective:   Nicholas Harrison is a 31 y.o. male patient admitted with substance withdrawal.   Patient presents sitting up in bed. Alert. Oriented to self only. Slurred speech with increased pausity. Reports reason for admission as "I've been taking some pills. Xans and fentanyl". Unable to state length of time using; reports last use as 'Sunday'. Endorses weed use, responds 'yeah' when asked about cocaine and 'little bit' amphetamines. Flat affect. Possible facial asymmetry.  Moderate coarse tremor with jerks noted. Slow to respond. Thought processes slow and concrete. Minimal facial expression. Reports completing high school and obtaining a 2 year degree in Art. Denies any history of learning disabilities, autism, etc. When asked about how he is feeling he responds 'retarded'. Denies any SI/HI/AVH. Does not appear to be responding to any external/internal stimuli.   Chart reviewed: Current labs: K 2.9, Cl 97, BUN 40, Mg 3.0, Total Protein 8.7, Bilirubin 1.6 (Indirect), 1.8 (Total), WBC 10.8, RBC 6.14, Hgb 17.6, Monocyte 1.3, Glucose 141. Most recent vital signs: 153/101, PR 85, RR 19, o2 99% on RA  Patient does not appear medically stable at this time. EDP contacted regarding concerns. Collateral obtained from mother who notes this is not patient's baseline. Based on patient presentation, labs, and collateral this patient is being deferred to the medical team for further evaluations and work-up.   Collateral: Caralyn Guileenny Woodring (mother)  [phone] Mom reports being called to pick up son this morning and saw patient was in the same state as he was when she dropped him off. Reports patient is not at baseline. Slurred and delayed speech. Slow processing. Facial features were 'different'. Was concerned for possible seizure activity; pt has history of seizures. No active seizure medications. Says patient requested to come to ED after being 'sick' since Tuesday nausea and vomiting, diarrhea onset yesterday. Patient was attempting to detox at home. Reports patient has been using opioids. Fentanyl, Adderall. Patient was not processing information. History of rehab [facility] x[facility] last there 2021 [place]. Feels patient is ready for change.   HPI:  Nicholas Harrison is a 31 year old male with a past psychiatric history of polysubstance abuse, depression, and seizure disorder who presented to [facility] voluntarily with his mother for evaluation of withdrawal. Patient reported regular use of Fentanyl, Xanax, and Adderall; stopped 5 days at home alone and began experiencing shaking, nausea and vomiting, diarrhea. UDS, BAL outstanding.  Past Psychiatric History: polysubstance abuse, depression, and seizure disorder  Risk to Self: n/a Risk to Others: n/a Prior Inpatient Therapy: n/a Prior Outpatient Therapy: n/a  Past Medical History:  Past Medical History:  Diagnosis Date   Anxiety    Depression    Drug-induced seizure (HCC)    Seizures (HCC)     Past Surgical History:  Procedure Laterality Date   HAND SURGERY     Family History:  Family History  Problem Relation Age of Onset   Depression Mother        anxiety   Diabetes Maternal Grandfather    Family Psychiatric History: not noted Social History:  Social History  Substance and Sexual Activity  Alcohol Use Not Currently     Social History   Substance and Sexual Activity  Drug Use Yes   Types: Benzodiazepines, Oxycodone, Marijuana   Comment:  fentanyl, xanax, adderall    Social History   Socioeconomic History   Marital status: Single    Spouse name: Not on file   Number of children: Not on file   Years of education: Not on file   Highest education level: Not on file  Occupational History   Not on file  Tobacco Use   Smoking status: Former   Smokeless tobacco: Former    Types: Associate Professor Use: Every day  Substance and Sexual Activity   Alcohol use: Not Currently   Drug use: Yes    Types: Benzodiazepines, Oxycodone, Marijuana    Comment: fentanyl, xanax, adderall   Sexual activity: Not Currently  Other Topics Concern   Not on file  Social History Narrative   Not on file   Social Determinants of Health   Financial Resource Strain: Not on file  Food Insecurity: Not on file  Transportation Needs: Not on file  Physical Activity: Not on file  Stress: Not on file  Social Connections: Not on file   Additional Social History:    Allergies:  No Known Allergies  Labs:  Results for orders placed or performed during the hospital encounter of 03/18/22 (from the past 48 hour(s))  Ethanol     Status: None   Collection Time: 03/18/22  8:27 PM  Result Value Ref Range   Alcohol, Ethyl (B) <10 <10 mg/dL    Comment: (NOTE) Lowest detectable limit for serum alcohol is 10 mg/dL.  For medical purposes only. Performed at [facility], [address], [place], [place] [address]   Hepatic function panel     Status: Abnormal   Collection Time: 03/18/22  8:27 PM  Result Value Ref Range   Total Protein 8.7 (H) 6.5 - 8.1 g/dL   Albumin 5.2 (H) 3.5 - 5.0 g/dL   AST 21 15 - 41 U/L   ALT 16 0 - 44 U/L   Alkaline Phosphatase 55 38 - 126 U/L   Total Bilirubin 1.8 (H) 0.3 - 1.2 mg/dL   Bilirubin, Direct 0.2 0.0 - 0.2 mg/dL   Indirect Bilirubin 1.6 (H) 0.3 - 0.9 mg/dL    Comment: Performed at [facility], [address], [place], [place] [address]  Magnesium     Status: Abnormal   Collection Time: 03/18/22  8:27  PM  Result Value Ref Range   Magnesium 3.0 (H) 1.7 - 2.4 mg/dL    Comment: Performed at [facility], [address], [place], [place] [address]  CBC with Differential     Status: Abnormal   Collection Time: 03/18/22  8:31 PM  Result Value Ref Range   WBC 10.8 (H) 4.0 - 10.5 K/uL   RBC 6.14 (H) 4.22 - 5.81 MIL/uL   Hemoglobin 17.6 (H) 13.0 - 17.0 g/dL   HCT 42.5 95.6 - 38.7 %   MCV 83.2 80.0 - 100.0 fL   MCH 28.7 26.0 - 34.0 pg   MCHC 34.4 30.0 - 36.0 g/dL   RDW 56.4 33.2 - 95.1 %   Platelets 391 150 - 400 K/uL   nRBC 0.0 0.0 - 0.2 %   Neutrophils Relative % 69 %   Neutro Abs 7.5 1.7 - 7.7 K/uL   Lymphocytes Relative 18 %   Lymphs Abs 1.9 0.7 -  4.0 K/uL   Monocytes Relative 12 %   Monocytes Absolute 1.3 (H) 0.1 - 1.0 K/uL   Eosinophils Relative 0 %   Eosinophils Absolute 0.0 0.0 - 0.5 K/uL   Basophils Relative 1 %   Basophils Absolute 0.1 0.0 - 0.1 K/uL   Immature Granulocytes 0 %   Abs Immature Granulocytes 0.03 0.00 - 0.07 K/uL    Comment: Performed at [facility], [address] [place], [place] [address]  Basic metabolic panel     Status: Abnormal   Collection Time: 03/18/22  8:31 PM  Result Value Ref Range   Sodium 140 135 - 145 mmol/L   Potassium 2.9 (L) 3.5 - 5.1 mmol/L   Chloride 97 (L) 98 - 111 mmol/L   CO2 26 22 - 32 mmol/L   Glucose, Bld 141 (H) 70 - 99 mg/dL    Comment: Glucose reference range applies only to samples taken after fasting for at least 8 hours.   BUN 40 (H) 6 - 20 mg/dL   Creatinine, Ser 4.74 0.61 - 1.24 mg/dL   Calcium 9.6 8.9 - 25.9 mg/dL   GFR, Estimated >56 >38 mL/min    Comment: (NOTE) Calculated using the CKD-EPI Creatinine Equation (2021)    Anion gap 17 (H) 5 - 15    Comment: Performed at [facility], [address], [place], [place] [address]  Resp panel by RT-PCR (RSV, Flu A&B, Covid) Anterior Nasal Swab     Status: None   Collection Time: 03/18/22 11:58 PM   Specimen: Anterior Nasal Swab  Result Value Ref Range   SARS  Coronavirus 2 by RT PCR NEGATIVE NEGATIVE    Comment: (NOTE) SARS-CoV-2 target nucleic acids are NOT DETECTED.  The SARS-CoV-2 RNA is generally detectable in upper respiratory specimens during the acute phase of infection. The lowest concentration of SARS-CoV-2 viral copies this assay can detect is 138 copies/mL. A negative result does not preclude SARS-Cov-2 infection and should not be used as the sole basis for treatment or other patient management decisions. A negative result may occur with  improper specimen collection/handling, submission of specimen other than nasopharyngeal swab, presence of viral mutation(s) within the areas targeted by this assay, and inadequate number of viral copies(<138 copies/mL). A negative result must be combined with clinical observations, patient history, and epidemiological information. The expected result is Negative.  Fact Sheet for Patients:  BloggerCourse.com  Fact Sheet for Healthcare Providers:  SeriousBroker.it  This test is no t yet approved or cleared by the [place] FDA and  has been authorized for detection and/or diagnosis of SARS-CoV-2 by FDA under an Emergency Use Authorization (EUA). This EUA will remain  in effect (meaning this test can be used) for the duration of the COVID-19 declaration under Section 564(b)(1) of the Act, 21 U.S.C.section 360bbb-3(b)(1), unless the authorization is terminated  or revoked sooner.       Influenza A by PCR NEGATIVE NEGATIVE   Influenza B by PCR NEGATIVE NEGATIVE    Comment: (NOTE) The Xpert Xpress SARS-CoV-2/FLU/RSV plus assay is intended as an aid in the diagnosis of influenza from Nasopharyngeal swab specimens and should not be used as a sole basis for treatment. Nasal washings and aspirates are unacceptable for Xpert Xpress SARS-CoV-2/FLU/RSV testing.  Fact Sheet for Patients: BloggerCourse.com  Fact Sheet  for Healthcare Providers: SeriousBroker.it  This test is not yet approved or cleared by the [place] FDA and has been authorized for detection and/or diagnosis of SARS-CoV-2 by FDA under an Emergency  Use Authorization (EUA). This EUA will remain in effect (meaning this test can be used) for the duration of the COVID-19 declaration under Section 564(b)(1) of the Act, 21 U.S.C. section 360bbb-3(b)(1), unless the authorization is terminated or revoked.     Resp Syncytial Virus by PCR NEGATIVE NEGATIVE    Comment: (NOTE) Fact Sheet for Patients: BloggerCourse.com  Fact Sheet for Healthcare Providers: SeriousBroker.it  This test is not yet approved or cleared by the [place] FDA and has been authorized for detection and/or diagnosis of SARS-CoV-2 by FDA under an Emergency Use Authorization (EUA). This EUA will remain in effect (meaning this test can be used) for the duration of the COVID-19 declaration under Section 564(b)(1) of the Act, 21 U.S.C. section 360bbb-3(b)(1), unless the authorization is terminated or revoked.  Performed at [facility], [address], [place], [place] [address]     Medications:  Current Facility-Administered Medications  Medication Dose Route Frequency Provider Last Rate Last Admin   cloNIDine (CATAPRES) tablet 0.1 mg  0.1 mg Oral QID Leevy-Johnson, Tinlee Navarrette A, NP       Followed by   Melene Muller ON 03/21/2022] cloNIDine (CATAPRES) tablet 0.1 mg  0.1 mg Oral BH-qamhs Leevy-Johnson, Kinda Pottle A, NP       Followed by   Melene Muller ON 03/23/2022] cloNIDine (CATAPRES) tablet 0.1 mg  0.1 mg Oral QAC breakfast Leevy-Johnson, Mileydi Milsap A, NP       dicyclomine (BENTYL) capsule 20 mg  20 mg Oral Q6H PRN Leevy-Johnson, Kerisha Goughnour A, NP       hydrOXYzine (ATARAX) tablet 25 mg  25 mg Oral Q6H PRN Leevy-Johnson, Torsha Lemus A, NP       loperamide (IMODIUM) capsule 2-4 mg  2-4 mg Oral PRN Leevy-Johnson, Sarafina Puthoff A,  NP       LORazepam (ATIVAN) tablet 1 mg  1 mg Oral Q6H PRN Leevy-Johnson, Dhanvin Szeto A, NP       methocarbamol (ROBAXIN) tablet 500 mg  500 mg Oral Q8H PRN Leevy-Johnson, Mory Herrman A, NP       multivitamin with minerals tablet 1 tablet  1 tablet Oral Daily Leevy-Johnson, Kiing Deakin A, NP       naproxen (NAPROSYN) tablet 500 mg  500 mg Oral BID PRN Leevy-Johnson, Sylas Twombly A, NP       thiamine (VITAMIN B1) injection 100 mg  100 mg Intramuscular Once Leevy-Johnson, Tayquan Gassman A, NP       [START ON 03/20/2022] thiamine (VITAMIN B1) tablet 100 mg  100 mg Oral Daily Leevy-Johnson, Louanne Calvillo A, NP       Current Outpatient Medications  Medication Sig Dispense Refill   cloNIDine (CATAPRES) 0.1 MG tablet Take 1 tablet (0.1 mg total) by mouth every 8 (eight) hours as needed. 20 tablet 0   albuterol (VENTOLIN HFA) 108 (90 Base) MCG/ACT inhaler INHALE 2 PUFFS INTO THE LUNGS EVERY 6 HOURS AS NEEDED FOR WHEEZING OR SHORTNESS OF BREATH 20.1 g 0   benzonatate (TESSALON PERLES) 100 MG capsule 1-2 capsules up to twice daily as needed for cough 30 capsule 0   busPIRone (BUSPAR) 10 MG tablet Take 10 mg by mouth 2 (two) times daily.     Naltrexone 380 MG SUSR Inject into the muscle.     propranolol (INDERAL) 20 MG tablet Take 20 mg by mouth 3 (three) times daily.     QUEtiapine (SEROQUEL) 100 MG tablet Take by mouth.     sertraline (ZOLOFT) 50 MG tablet Take 50 mg by mouth daily.      Musculoskeletal: Strength & Muscle  Tone: decreased Gait & Station:  unable to assess Patient leans: Backward  Psychiatric Specialty Exam:  Presentation  General Appearance: Casual  Eye Contact:Other (comment) (inconsistent)  Speech:Slurred; Slow  Speech Volume:Decreased  Handedness:Left   Mood and Affect  Mood:Depressed  Affect:Congruent; Flat   Thought Process  Thought Processes:Other (comment) (concrete)  Descriptions of Associations:No data recorded Orientation:None  Thought Content:No data recorded History of  Schizophrenia/Schizoaffective disorder:No data recorded Duration of Psychotic Symptoms:No data recorded Hallucinations:Hallucinations: None  Ideas of Reference:None  Suicidal Thoughts:Suicidal Thoughts: No  Homicidal Thoughts:Homicidal Thoughts: No   Sensorium  Memory:Immediate Poor; Recent Poor  Judgment:Impaired  Insight:Other (comment) (unable to fully asseess)   Executive Functions  Concentration:Poor  Attention Span:Poor  Recall:Poor  [employer] of Knowledge:Fair  Language:Poor   Psychomotor Activity  Psychomotor Activity:Psychomotor Activity: Tremor   Assets  Assets:Social Support   Sleep  Sleep:Sleep: Fair    Physical Exam: Physical Exam Vitals and nursing note reviewed.  Constitutional:      Appearance: He is ill-appearing and toxic-appearing.  HENT:     Head:     Comments: Face asymmetrical    Nose: Nose normal.  Cardiovascular:     Rate and Rhythm: Tachycardia present.  Pulmonary:     Effort: Pulmonary effort is normal.  Musculoskeletal:     Cervical back: Normal range of motion.     Comments: Coarse tremor; decreased movement noted  Skin:    Comments: flushed  Neurological:     Mental Status: He is disoriented.     Sensory: Sensory deficit present.     Motor: Weakness and tremor present.  Psychiatric:        Attention and Perception: He is inattentive. He does not perceive auditory or visual hallucinations.        Mood and Affect: Affect is flat and inappropriate.        Speech: Speech is delayed and slurred.        Behavior: Behavior is slowed and withdrawn.        Thought Content: Thought content is not delusional. Thought content does not include homicidal or suicidal ideation. Thought content does not include homicidal or suicidal plan.        Cognition and Memory: Cognition is impaired. Memory is impaired.        Judgment: Judgment is inappropriate.    Review of Systems  Neurological:  Positive for tremors, sensory change and  speech change.  Psychiatric/Behavioral:  Positive for depression.    Blood pressure (!) 153/101, pulse 85, temperature 98.3 F (36.8 C), resp. rate 19, height 5\' 9"  (1.753 m), weight 77.1 kg, SpO2 99 %. Body mass index is 25.1 kg/m.  Treatment Plan Summary: Daily contact with patient to assess and evaluate symptoms and progress in treatment, Medication management, and Plan   Start:   - COWS protocol for opioid  Withdrawal symptoms management  - EKG   - Head CT r/o any changes  Disposition: Patient does not meet criteria for psychiatric inpatient admission. Based on patient's presentation; care is deferred to medical team at this time for further stabilization and management.   This service was provided via telemedicine using a 2-way, interactive audio and video technology.  Names of all persons participating in this telemedicine service and their role in this encounter. Name: Role: PMHNP  Name: Maxie Barb Role: Attending MD  Name: Nelly Rout Role: patient  Name: Nicholas Blossom Role: mother    Caralyn Guile, NP 03/19/2022 10:43 AM

## 2022-03-19 NOTE — Hospital Course (Addendum)
31 year old male with a history of anxiety, depression, substance use disorder presenting with approximately 5-day history of intermittent nausea, vomiting, and altered mental status.  The patient freely admits that he has been snorting fentanyl and using Xanax and Adderall off the street.  He has also been using marijuana.  He denies any other illicit substances including cocaine and amphetamines presently.  He states that he last used illicit substances about 6 days prior to this admission date.  He states that he has been wanting to be sober.  On the day after Christmas, his mother noted the patient to have some altered mental status manifested by delayed verbal responses and some somnolence.  His mother has also noted the patient has had tremors of his upper and lower extremities that have been intermittent.  She was also noted that the patient's " eyes were fluttering".  However during these episodes, the patient would still be able to respond to his mother albeit with slow responses.  She states that he has certainly been altered speaking some nonsensical speech.  There is been no tongue biting bowel or bladder incontinence.  Mom states that the patient has had some intermittent nausea and vomiting over the past 4 to 5 days.  He is also had some occasional loose stools.  There is been no hematochezia or melena.  He denies any fevers, chills, chest pain, breath, coughing, hemoptysis, headache, neck pain. In the past 1 to 2 days mother has noted increasing generalized weakness to the point where the patient is having difficulty ambulating. Due to these constellation of symptoms, the patient was brought to the emergency department for further evaluation and treatment. In the ED, the patient was afebrile and hemodynamically stable with oxygen saturation 100% room air.  WBC 10.8, hemoglobin 17.6, platelets 291,000.  Sodium 140, potassium 2.8, bicarbonate 26, BUN 40, creatinine 0.95.  AST 21, ALT 16, alk  phosphatase 55, total bilirubin 1.8.  COVID-19 PCR was negative.  Alcohol level was negative.  EKG shows sinus rhythm with nonspecific T wave changes.  CT of the brain was negative for acute findings.  He was given 2 L normal saline and his potassium was repleted. The patient was seen by psychiatry on a virtual platform.  They did not feel the patient met criteria for inpatient psychiatric admission.  EDP spoke with neurology who felt the patient needed neurologic workup including MRI and EEG.  Unfortunately, these modalities are not available at [facility] presently.  As result, patient has been transferred to [facility] for further evaluation and treatment of his encephalopathy and symptomatic withdrawal.

## 2022-03-19 NOTE — ED Notes (Addendum)
Mother took all pt clothes and belongings home with her.

## 2022-03-19 NOTE — Discharge Instructions (Addendum)
Begin taking clonidine as prescribed as needed for withdrawal symptoms.  Follow-up with substance abuse treatment centers as listed in the resource guide attached to this discharge summary.

## 2022-03-19 NOTE — Consult Note (Addendum)
Telepsych Consultation   Reason for Consult:  behavioral changes, history of substance-induced psychosis Referring Physician:  Geoffery Lyons, MD Location of Patient: [facility] Location of Provider: Behavioral Health TTS Department  Patient Identification: Nicholas Harrison MRN:  [id number] Principal Diagnosis: Toxic encephalopathy Diagnosis:  Principal Problem:   Acute toxic encephalopathy Active Problems:   Benzodiazepine dependence, episodic (HCC)   Opiate withdrawal (HCC)   Hypokalemia   Dehydration   Total Time spent with patient: 45 minutes  Subjective:   Nicholas Harrison is a 31 y.o. male patient admitted with substance withdrawal.   Patient presents sitting up in bed. Alert. Oriented to self only. Slurred speech with increased pausity. Reports reason for admission as "I've been taking some pills. Xans and fentanyl". Unable to state length of time using; reports last use as 'Sunday'. Endorses weed use, responds 'yeah' when asked about cocaine and 'little bit' amphetamines. Flat affect. Possible facial asymmetry.  Moderate coarse tremor with jerks noted. Slow to respond. Thought processes slow and concrete. Minimal facial expression. Reports completing high school and obtaining a 2 year degree in Art. Denies any history of learning disabilities, autism, etc. When asked about how he is feeling he responds 'retarded'. Denies any SI/HI/AVH. Does not appear to be responding to any external/internal stimuli.   Chart reviewed: Current labs: K 2.9, Cl 97, BUN 40, Mg 3.0, Total Protein 8.7, Bilirubin 1.6 (Indirect), 1.8 (Total), WBC 10.8, RBC 6.14, Hgb 17.6, Monocyte 1.3, Glucose 141. Most recent vital signs: 153/101, PR 85, RR 19, o2 99% on RA  Patient does not appear medically stable at this time. EDP contacted regarding concerns. Collateral obtained from mother who notes this is not patient's baseline. Based on patient presentation, labs, and collateral this patient is being deferred to  the medical team for further evaluations and work-up.   Collateral: Sivan Quast (mother) [phone] Mom reports being called to pick up son this morning and saw patient was in the same state as he was when she dropped him off. Reports patient is not at baseline. Slurred and delayed speech. Slow processing. Facial features were 'different'. Was concerned for possible seizure activity; pt has history of seizures. No active seizure medications. Says patient requested to come to ED after being 'sick' since Tuesday nausea and vomiting, diarrhea onset yesterday. Patient was attempting to detox at home. Reports patient has been using opioids. Fentanyl, Adderall. Patient was not processing information. History of rehab [facility] x2 last there 2021 completed, [place], [place]. Feels patient is ready for change.   HPI:  Nicholas Harrison is a 31 year old male with a past psychiatric history of polysubstance abuse, depression, and seizure disorder who presented to [facility] voluntarily with his mother for evaluation of withdrawal. Patient reported regular use of Fentanyl, Xanax, and Adderall; stopped 5 days at home alone and began experiencing shaking, nausea and vomiting, diarrhea. UDS, BAL outstanding.  Past Psychiatric History: polysubstance abuse, depression, and seizure disorder  Risk to Self: n/a Risk to Others: n/a Prior Inpatient Therapy: n/a Prior Outpatient Therapy: n/a  Past Medical History:  Past Medical History:  Diagnosis Date   Anxiety    Depression    Drug-induced seizure (HCC)    Seizures (HCC)     Past Surgical History:  Procedure Laterality Date   HAND SURGERY     Family History:  Family History  Problem Relation Age of Onset   Depression Mother        anxiety   Diabetes Maternal  Grandfather    Family Psychiatric History: not noted Social History:  Social History   Substance and Sexual Activity  Alcohol Use Not Currently     Social History   Substance and  Sexual Activity  Drug Use Yes   Types: Benzodiazepines, Oxycodone, Marijuana   Comment: fentanyl, xanax, adderall    Social History   Socioeconomic History   Marital status: Single    Spouse name: Not on file   Number of children: Not on file   Years of education: Not on file   Highest education level: Not on file  Occupational History   Not on file  Tobacco Use   Smoking status: Former   Smokeless tobacco: Former    Types: Associate Professor Use: Every day  Substance and Sexual Activity   Alcohol use: Not Currently   Drug use: Yes    Types: Benzodiazepines, Oxycodone, Marijuana    Comment: fentanyl, xanax, adderall   Sexual activity: Not Currently  Other Topics Concern   Not on file  Social History Narrative   Not on file   Social Determinants of Health   Financial Resource Strain: Not on file  Food Insecurity: No Food Insecurity (03/21/2022)   Hunger Vital Sign    Worried About Running Out of Food in the Last Year: Never true    Ran Out of Food in the Last Year: Never true  Transportation Needs: No Transportation Needs (03/21/2022)   PRAPARE - Administrator, Civil Service (Medical): No    Lack of Transportation (Non-Medical): No  Physical Activity: Not on file  Stress: Not on file  Social Connections: Not on file   Additional Social History:    Allergies:   Allergies  Allergen Reactions   Quetiapine Fumarate Other (See Comments)    Sleepy, vivid dreams    Labs:  No results found for this or any previous visit (from the past 48 hour(s)).   Medications:  No current facility-administered medications for this encounter.   Current Outpatient Medications  Medication Sig Dispense Refill   ondansetron (ZOFRAN) 4 MG tablet Take 1 tablet (4 mg total) by mouth daily as needed for nausea or vomiting. 30 tablet 1    Musculoskeletal: Strength & Muscle Tone: decreased Gait & Station:  unable to assess Patient leans: Backward  Psychiatric  Specialty Exam:  Presentation  General Appearance: Casual  Eye Contact:Other (comment) (inconsistent)  Speech:Slurred; Slow  Speech Volume:Decreased  Handedness:Left   Mood and Affect  Mood:Depressed  Affect:Congruent; Flat   Thought Process  Thought Processes:Other (comment) (concrete)  Descriptions of Associations:No data recorded Orientation:None  Thought Content:No data recorded History of Schizophrenia/Schizoaffective disorder:No data recorded Duration of Psychotic Symptoms:No data recorded Hallucinations:No data recorded  Ideas of Reference:None  Suicidal Thoughts:No data recorded  Homicidal Thoughts:No data recorded   Sensorium  Memory:Immediate Poor; Recent Poor  Judgment:Impaired  Insight:Other (comment) (unable to fully asseess)   Executive Functions  Concentration:Poor  Attention Span:Poor  Recall:Poor  [employer] of Knowledge:Fair  Language:Poor   Psychomotor Activity  Psychomotor Activity:No data recorded   Assets  Assets:Social Support   Sleep  Sleep:No data recorded    Physical Exam: Physical Exam Vitals and nursing note reviewed.  Constitutional:      Appearance: He is ill-appearing and toxic-appearing.  HENT:     Head:     Comments: Face asymmetrical    Nose: Nose normal.  Cardiovascular:     Rate and Rhythm: Tachycardia present.  Pulmonary:  Effort: Pulmonary effort is normal.  Musculoskeletal:     Cervical back: Normal range of motion.     Comments: Coarse tremor; decreased movement noted  Skin:    Comments: flushed  Neurological:     Mental Status: He is disoriented.     Sensory: Sensory deficit present.     Motor: Weakness and tremor present.  Psychiatric:        Attention and Perception: He is inattentive. He does not perceive auditory or visual hallucinations.        Mood and Affect: Affect is flat and inappropriate.        Speech: Speech is delayed and slurred.        Behavior: Behavior is slowed and  withdrawn.        Thought Content: Thought content is not delusional. Thought content does not include homicidal or suicidal ideation. Thought content does not include homicidal or suicidal plan.        Cognition and Memory: Cognition is impaired. Memory is impaired.        Judgment: Judgment is inappropriate.    Review of Systems  Neurological:  Positive for tremors, sensory change and speech change.  Psychiatric/Behavioral:  Positive for depression.    Blood pressure (!) 146/98, pulse 71, temperature 98.3 F (36.8 C), temperature source Oral, resp. rate 20, height 5\' 9"  (1.753 m), weight 77.1 kg, SpO2 100 %. Body mass index is 25.1 kg/m.  Treatment Plan Summary: Daily contact with patient to assess and evaluate symptoms and progress in treatment, Medication management, and Plan   Start:   - COWS protocol for opioid  Withdrawal symptoms management  - EKG   - Head CT r/o any changes  Disposition: Patient does not meet criteria for psychiatric inpatient admission. Based on patient's presentation; care is deferred to medical team at this time for further stabilization and management. Patient admitted to medical service.   This service was provided via telemedicine using a 2-way, interactive audio and video technology.  Names of all persons participating in this telemedicine service and their role in this encounter. Name: Role: PMHNP  Name: Maxie Barb Role: Attending MD  Name: Nelly Rout Role: patient  Name: Daisy Blossom Role: mother    Caralyn Guile, NP 03/25/2022 4:37 PM

## 2022-03-19 NOTE — ED Notes (Signed)
Patient remains in a state of somnolence. Very slow reactions. Generalized weakness. Slow, soft speech. Oriented to person an place. Confused to date. Patient encouraged to try to void. Patient got 2L of IV fluid on previous shift and has not voided since arrival that I am aware of.

## 2022-03-19 NOTE — H&P (Signed)
History and Physical    Patient: Nicholas Harrison GUR:[id number] DOB: 01/28/1991 DOA: 03/18/2022 DOS: the patient was seen and examined on 03/19/2022 PCP: Cassandria Anger, MD  Patient coming from: Home  Chief Complaint:  Chief Complaint  Patient presents with   Withdrawal    Pt last used fentanyl 4-5 days ago but mother is concerned pt is still nauseous and vomiting and is afraid he is dehydrated.   HPI: Nicholas Harrison is a 31 year old male with a history of anxiety, depression, substance use disorder presenting with approximately 5-day history of intermittent nausea, vomiting, and altered mental status.  The patient freely admits that he has been snorting fentanyl and using Xanax and Adderall off the street.  He has also been using marijuana.  He denies any other illicit substances including cocaine and amphetamines presently.  He states that he last used illicit substances about 6 days prior to this admission date.  He states that he has been wanting to be sober.  On the day after Christmas, his mother noted the patient to have some altered mental status manifested by delayed verbal responses and some somnolence.  His mother has also noted the patient has had tremors of his upper and lower extremities that have been intermittent.  She was also noted that the patient's " eyes were fluttering".  However during these episodes, the patient would still be able to respond to his mother albeit with slow responses.  She states that he has certainly been altered speaking some nonsensical speech.  There is been no tongue biting bowel or bladder incontinence.  Mom states that the patient has had some intermittent nausea and vomiting over the past 4 to 5 days.  He is also had some occasional loose stools.  There is been no hematochezia or melena.  He denies any fevers, chills, chest pain, breath, coughing, hemoptysis, headache, neck pain. In the past 1 to 2 days mother has noted increasing generalized  weakness to the point where the patient is having difficulty ambulating. Due to these constellation of symptoms, the patient was brought to the emergency department for further evaluation and treatment. In the ED, the patient was afebrile and hemodynamically stable with oxygen saturation 100% room air.  WBC 10.8, hemoglobin 17.6, platelets 291,000.  Sodium 140, potassium 2.8, bicarbonate 26, BUN 40, creatinine 0.95.  AST 21, ALT 16, alk phosphatase 55, total bilirubin 1.8.  COVID-19 PCR was negative.  Alcohol level was negative.  EKG shows sinus rhythm with nonspecific T wave changes.  CT of the brain was negative for acute findings.  He was given 2 L normal saline and his potassium was repleted. The patient was seen by psychiatry on a virtual platform.  They did not feel the patient met criteria for inpatient psychiatric admission.  EDP spoke with neurology who felt the patient needed neurologic workup including MRI and EEG.  Unfortunately, these modalities are not available at [facility] presently.  As result, patient has been transferred to [facility] for further evaluation and treatment of his encephalopathy and symptomatic withdrawal.  Review of Systems: As mentioned in the history of present illness. All other systems reviewed and are negative. Past Medical History:  Diagnosis Date   Anxiety    Depression    Drug-induced seizure ([place])    Seizures ([place])    Past Surgical History:  Procedure Laterality Date   HAND SURGERY     Social History:  reports that he has quit smoking. He has quit using  smokeless tobacco.  His smokeless tobacco use included chew. He reports that he does not currently use alcohol. He reports current drug use. Drugs: Benzodiazepines, Oxycodone, and Marijuana.  No Known Allergies  Family History  Problem Relation Age of Onset   Depression Mother        anxiety   Diabetes Maternal Grandfather     Prior to Admission medications   Medication Sig Start Date End Date  Taking? Authorizing Provider  cloNIDine (CATAPRES) 0.1 MG tablet Take 1 tablet (0.1 mg total) by mouth every 8 (eight) hours as needed. 03/19/22  Yes Delo, Nathaneil Canary, MD  busPIRone (BUSPAR) 10 MG tablet Take 10 mg by mouth 2 (two) times daily. Patient not taking: Reported on 03/19/2022 12/11/19   [provider]  Naltrexone 380 MG SUSR Inject into the muscle. Patient not taking: Reported on 03/19/2022 12/28/20   [provider]  propranolol (INDERAL) 20 MG tablet Take 20 mg by mouth 3 (three) times daily. Patient not taking: Reported on 03/19/2022 02/18/21   [provider]  QUEtiapine (SEROQUEL) 100 MG tablet Take by mouth. Patient not taking: Reported on 03/19/2022 01/27/20   [provider]  sertraline (ZOLOFT) 50 MG tablet Take 50 mg by mouth daily. Patient not taking: Reported on 03/19/2022 01/27/20   [provider]    Physical Exam: Vitals:   03/19/22 0930 03/19/22 1239 03/19/22 1524 03/19/22 1531  BP: (!) 153/101 128/75 (!) 124/95 (!) 124/95  Pulse: 85 79  87  Resp: 19 20  (!) 28  Temp:  97.8 F (36.6 C)    TempSrc:  Oral    SpO2: 99% 100%  99%  Weight:      Height:       GENERAL:  A&O x 3, NAD, well developed, cooperative, follows commands HEENT: [place]/AT, No thrush, No icterus, No oral ulcers Neck:  No neck mass, No meningismus, soft, supple CV: RRR, no S3, no S4, no rub, no JVD Lungs: Bibasal rales.  No wheezing.  Good air movement Abd: soft/NT +BS, nondistended Ext: No edema, no lymphangitis, no cyanosis, no rashes Neuro:  CN II-XII intact, strength 4/5 in RUE, RLE, strength 4/5 LUE, LLE; sensation intact bilateral; no dysmetria; babinski equivocal.  NO rigidity on PROM on exam.  Rhythmic bilateral hand and feet tremors noted.  Data Reviewed: Data reviewed above in history  Assessment and Plan: Acute metabolic/toxic encephalopathy -Certainly, his constellation of symptoms may be certainly attributable to symptomatic withdrawal  from his illicit substance use versus direct effects from his illicit substances -no rigidity on exam with PROM -MRI brain -EEG -B12 -TSH -Ammonia -Folic acid -Thiamine level -CPK -continue COWS withdrawal protocol  Volume depletion/dehydration -Start IV fluids  Hypokalemia -Replete -Check magnesium--3.0  Tremors -Suspect this is partly due to his withdrawal  Hyperbilirubinemia -Fractionate bili -No abdominal pain  Anxiety/depression -Patient was previously on Seroquel, sertraline, buspirone all of which he is no longer taking    Advance Care Planning: FULL  Consults: psychiatry, neurology  Family Communication: mother at bedside 12/30  Severity of Illness: The appropriate patient status for this patient is INPATIENT. Inpatient status is judged to be reasonable and necessary in order to provide the required intensity of service to ensure the patient's safety. The patient's presenting symptoms, physical exam findings, and initial radiographic and laboratory data in the context of their chronic comorbidities is felt to place them at high risk for further clinical deterioration. Furthermore, it is not anticipated that the patient will be medically stable for  discharge from the hospital within 2 midnights of admission.   * I certify that at the point of admission it is my clinical judgment that the patient will require inpatient hospital care spanning beyond 2 midnights from the point of admission due to high intensity of service, high risk for further deterioration and high frequency of surveillance required.*  Author: Orson Eva, MD 03/19/2022 3:41 PM  For on call review [email or web address].

## 2022-03-19 NOTE — ED Notes (Signed)
Assisted patient with urinal.  Voided very dark amber urine. Remains in a state of somnolence with generalized weakness.

## 2022-03-19 NOTE — ED Provider Notes (Signed)
Physical Exam  BP 134/84   Pulse 75   Temp 99.2 F (37.3 C) (Oral)   Resp (!) 24   Ht 5\' 9"  (1.753 m)   Wt 77.1 kg   SpO2 97%   BMI 25.10 kg/m   Physical Exam Vitals and nursing note reviewed.  Constitutional:      General: He is not in acute distress.    Appearance: He is well-developed.  HENT:     Head: Normocephalic and atraumatic.     Right Ear: External ear normal.     Left Ear: External ear normal.     Nose: Nose normal.  Eyes:     Extraocular Movements: Extraocular movements intact.     Conjunctiva/sclera: Conjunctivae normal.     Pupils: Pupils are equal, round, and reactive to light.  Cardiovascular:     Rate and Rhythm: Normal rate and regular rhythm.  Pulmonary:     Effort: Pulmonary effort is normal. No respiratory distress.  Abdominal:     General: There is no distension.     Palpations: Abdomen is soft. There is no mass.     Tenderness: There is no abdominal tenderness. There is no guarding.  Musculoskeletal:        General: No swelling.     Cervical back: Neck supple.  Skin:    General: Skin is warm.     Capillary Refill: Capillary refill takes less than 2 seconds.     Comments: moist  Neurological:     Mental Status: He is alert.     Comments: MENTAL STATUS: Alert and oriented to self.  Thought he was in [place].  Did not know the year. CRANIAL NERVES: II: Pupils equal and reactive 5 mm mm BL, no RAPD III, IV, VI: EOM intact, no gaze preference or deviation, no nystagmus. V: normal sensation to light touch in V1, V2, and V3 segments bilaterally VII: no facial weakness or asymmetry, no nasolabial fold flattening VIII: normal hearing to speech and finger friction IX, X: normal palatal elevation, no uvular deviation XI: 5/5 head turn and 5/5 shoulder shrug bilaterally XII: midline tongue protrusion MOTOR: 4/5 strength in R shoulder flexion, elbow flexion and extension, and grip strength. 4/5 strength in L shoulder flexion, elbow flexion and  extension, and grip strength.  4/5 strength in R hip and knee flexion, knee extension, ankle plantar and dorsiflexion. 4/5 strength in L hip and knee flexion, knee extension, ankle plantar and dorsiflexion. SENSORY: Normal sensation to light touch in all extremities COORD: Difficulty with finger-to-nose and heel-to-shin Other: Resting tremor.  Increased muscle tone diffusely.  No clonus or hyperreflexia noted.   Psychiatric:        Mood and Affect: Mood normal.     Procedures  Procedures  ED Course / MDM   Clinical Course as of 03/19/22 2014  Sat Mar 19, 2022  0940 Notified by Mar 21, 2022 NP that patient is a benzodiazepine user.  She reports that he is slow to respond and is asking for him to be put on CIWA protocol and for potential head CT. [RP]  1305 Obtained additional history from pt and his mother.  31 year old male with a history of polysubstance abuse, depression, and drug-induced seizures who presents to the emergency department with altered mental status.  Per the patient's mother for the past 4 to 5 days he has not been behaving himself.  Says that he last used Xanax on Sunday.  Also reports that was around the time of his last use  of Adderall and fentanyl as well which she snorts.  Denies any injection drug use.  Was previously on BuSpar, fluoxetine, and Seroquel.  Unknown when he stopped taking these medications.  Initial workup in the emergency department reveals hemoconcentration on CBC with hypokalemia and elevated magnesium on his chemistry.  Evaluated by psychiatry who recommended a CT head as well as CIWA protocol due to concerns of possible medical etiology of his symptoms.  On physical exam has increased muscle tone and resting tremor but no clonus or hyperreflexia.  Patient has been afebrile since coming to the emergency department.  Will consult neurology regarding his symptoms. [RP]  1421 Dr Otelia Limes recommends admission for further evaluation.states that sometimes  solvents can cause this type of presentation and could be possible with him snorting drugs. Recommends MRI to assess for chasing the dragon syndrome.  [RP]  1451 Spoke with Dr Tat from hospitalist. [facility] eval and MRI.  [RP]    Clinical Course User Index [RP] Rondel Baton, MD   Medical Decision Making Amount and/or Complexity of Data Reviewed Labs: ordered. Radiology: ordered.  Risk Prescription drug management. Decision regarding hospitalization.      Rondel Baton, MD 03/19/22 2014

## 2022-03-20 ENCOUNTER — Inpatient Hospital Stay (HOSPITAL_COMMUNITY): Payer: BC Managed Care – PPO

## 2022-03-20 DIAGNOSIS — F1193 Opioid use, unspecified with withdrawal: Secondary | ICD-10-CM | POA: Diagnosis not present

## 2022-03-20 DIAGNOSIS — E876 Hypokalemia: Secondary | ICD-10-CM | POA: Diagnosis not present

## 2022-03-20 DIAGNOSIS — G40909 Epilepsy, unspecified, not intractable, without status epilepticus: Secondary | ICD-10-CM | POA: Diagnosis not present

## 2022-03-20 DIAGNOSIS — G9341 Metabolic encephalopathy: Secondary | ICD-10-CM | POA: Diagnosis not present

## 2022-03-20 LAB — SALICYLATE LEVEL: Salicylate Lvl: <7 mg/dL — ABNORMAL LOW (ref 7.0–30.0)

## 2022-03-20 LAB — COMPREHENSIVE METABOLIC PANEL
ALT: 15 U/L (ref 0–44)
ALT: 16 U/L (ref 0–44)
AST: 19 U/L (ref 15–41)
AST: 18 U/L (ref 15–41)
Albumin: 3.9 g/dL (ref 3.5–5.0)
Albumin: 3.8 g/dL (ref 3.5–5.0)
Alkaline Phosphatase: 41 U/L (ref 38–126)
Alkaline Phosphatase: 40 U/L (ref 38–126)
Anion gap: 6 (ref 5–15)
Anion gap: 6 (ref 5–15)
BUN: 28 mg/dL — ABNORMAL HIGH (ref 6–20)
BUN: 29 mg/dL — ABNORMAL HIGH (ref 6–20)
CO2: 24 mmol/L (ref 22–32)
CO2: 24 mmol/L (ref 22–32)
Calcium: 8.5 mg/dL — ABNORMAL LOW (ref 8.9–10.3)
Calcium: 8.8 mg/dL — ABNORMAL LOW (ref 8.9–10.3)
Chloride: 111 mmol/L (ref 98–111)
Chloride: 114 mmol/L — ABNORMAL HIGH (ref 98–111)
Creatinine, Ser: 0.82 mg/dL (ref 0.61–1.24)
Creatinine, Ser: 0.8 mg/dL (ref 0.61–1.24)
GFR, Estimated: >60 mL/min (ref >60)
GFR, Estimated: >60 mL/min (ref >60)
Glucose, Bld: 108 mg/dL — ABNORMAL HIGH (ref 70–99)
Glucose, Bld: 106 mg/dL — ABNORMAL HIGH (ref 70–99)
Potassium: 3.7 mmol/L (ref 3.5–5.1)
Potassium: 3.7 mmol/L (ref 3.5–5.1)
Sodium: 141 mmol/L (ref 135–145)
Sodium: 144 mmol/L (ref 135–145)
Total Bilirubin: 1.5 mg/dL — ABNORMAL HIGH (ref 0.3–1.2)
Total Bilirubin: 1.7 mg/dL — ABNORMAL HIGH (ref 0.3–1.2)
Total Protein: 6.6 g/dL (ref 6.5–8.1)
Total Protein: 6.3 g/dL — ABNORMAL LOW (ref 6.5–8.1)

## 2022-03-20 LAB — BLOOD GAS, VENOUS
Acid-Base Excess: 6.4 mmol/L — ABNORMAL HIGH (ref 0.0–2.0)
Bicarbonate: 30.5 mmol/L — ABNORMAL HIGH (ref 20.0–28.0)
Drawn by: 1517
FIO2: 21 %
O2 Saturation: 79.8 %
Patient temperature: 37.4
pCO2, Ven: 42 mmHg — ABNORMAL LOW (ref 44–60)
pH, Ven: 7.47 — ABNORMAL HIGH (ref 7.25–7.43)
pO2, Ven: 47 mmHg — ABNORMAL HIGH (ref 32–45)

## 2022-03-20 LAB — ACETAMINOPHEN LEVEL: Acetaminophen (Tylenol), Serum: <10 ug/mL — ABNORMAL LOW (ref 10–30)

## 2022-03-20 LAB — HIV ANTIBODY (ROUTINE TESTING W REFLEX): HIV Screen 4th Generation wRfx: NONREACTIVE

## 2022-03-20 LAB — T4, FREE: Free T4: 1.04 ng/dL (ref 0.61–1.12)

## 2022-03-20 MED ORDER — VITAMIN B-12 100 MCG PO TABS
500.0000 ug | ORAL_TABLET | Freq: Every day | ORAL | Status: DC
  Administered 2022-03-21 – 2022-03-22 (×2): 500 ug via ORAL
  Filled 2022-03-20 (×2): qty 5

## 2022-03-20 MED ORDER — CYANOCOBALAMIN 1000 MCG/ML IJ SOLN
1000.0000 ug | Freq: Once | INTRAMUSCULAR | Status: AC
  Administered 2022-03-20: 1000 ug via INTRAMUSCULAR

## 2022-03-20 NOTE — Progress Notes (Signed)
Pt back from MRI 

## 2022-03-20 NOTE — ED Notes (Addendum)
Nurse to room for hourly rounding, pt awake, lying in bed, asked pt how he was feeling, pt says better, asked if pt needed anything, pt says no he is fine. Nurse noted that pt respirations were increased, not labored, but more rapid than normal, Dr Wilkie Aye made aware-new lab orders received.

## 2022-03-20 NOTE — ED Provider Notes (Signed)
5:02 AM  Nursing concern regarding patient's increased respiratory rate.  Has had respiratory rates in the high 20s to low 30s for most of the shift.  He was originally admitted for potential hepatic encephalopathy.  Will repeat CMP.  Do not see that there was a Tylenol and aspirin level as well as VBG.  5:02 AM Labs improved from prior.  Salicylate and Tylenol levels are negative.  Has a respiratory acidosis.  Asked by nursing to assess the patient.  He is diaphoretic and mildly tachypneic.  Nursing states that he has not urinated in over 12 hours.  Bladder scan with ~400 in bladder.  After some encouragement, he was able to void spontaneously.   Continues to report only Xanax and narcotic use.  Ongoing encephalopathy picture with disorientation to time but not place or person.  Tremulous but responsive.  Unclear if there is an additional toxidrome picture.  Withdrawal is certainly most likely culprit.  Physical Exam  BP (!) 143/90   Pulse 67   Temp 99.2 F (37.3 C) (Oral)   Resp (!) 21   Ht 1.753 m (5\' 9" )   Wt 77.1 kg   SpO2 98%   BMI 25.10 kg/m   Physical Exam  Procedures  Procedures  ED Course / MDM   Clinical Course as of 03/20/22 0502  Sat Mar 19, 2022  0940 Notified by Mar 21, 2022 NP that patient is a benzodiazepine user.  She reports that he is slow to respond and is asking for him to be put on CIWA protocol and for potential head CT. [RP]  1305 Obtained additional history from pt and his mother.  31 year old male with a history of polysubstance abuse, depression, and drug-induced seizures who presents to the emergency department with altered mental status.  Per the patient's mother for the past 4 to 5 days he has not been behaving himself.  Says that he last used Xanax on Sunday.  Also reports that was around the time of his last use of Adderall and fentanyl as well which she snorts.  Denies any injection drug use.  Was previously on BuSpar, fluoxetine, and Seroquel.  Unknown  when he stopped taking these medications.  Initial workup in the emergency department reveals hemoconcentration on CBC with hypokalemia and elevated magnesium on his chemistry.  Evaluated by psychiatry who recommended a CT head as well as CIWA protocol due to concerns of possible medical etiology of his symptoms.  On physical exam has increased muscle tone and resting tremor but no clonus or hyperreflexia.  Patient has been afebrile since coming to the emergency department.  Will consult neurology regarding his symptoms. [RP]  1421 Dr Saturday recommends admission for further evaluation.states that sometimes solvents can cause this type of presentation and could be possible with him snorting drugs. Recommends MRI to assess for chasing the dragon syndrome.  [RP]  1451 Spoke with Dr Tat from hospitalist. [facility] eval and MRI.  [RP]    Clinical Course User Index [RP] Otelia Limes, MD   Medical Decision Making Amount and/or Complexity of Data Reviewed Labs: ordered. Radiology: ordered.  Risk Prescription drug management. Decision regarding hospitalization.          Rondel Baton, MD 03/20/22 [phone]

## 2022-03-20 NOTE — ED Notes (Signed)
pt reported he did not have to urinate, although bladder scan shows over 400 ml urine in bladder. when encouraged to void in urinal, pt was able to do so, pt trembled when urinating, nurse asked if he had pain when urinating, pt reports pain described as burning. urine amber in color. Dr Wilkie Aye made aware.

## 2022-03-20 NOTE — Progress Notes (Signed)
PROGRESS NOTE  Nicholas Harrison KNL:[id number] DOB: [birth year] DOA: 03/18/2022 PCP: Cassandria Anger, MD  Brief History:  31 year old male with a history of anxiety, depression, substance use disorder presenting with approximately 5-day history of intermittent nausea, vomiting, and altered mental status.  The patient freely admits that he has been snorting fentanyl and using Xanax and Adderall off the street.  He has also been using marijuana.  He denies any other illicit substances including cocaine and amphetamines presently.  He states that he last used illicit substances about 6 days prior to this admission date.  He states that he has been wanting to be sober.  On the day after Christmas, his mother noted the patient to have some altered mental status manifested by delayed verbal responses and some somnolence.  His mother has also noted the patient has had tremors of his upper and lower extremities that have been intermittent.  She was also noted that the patient's " eyes were fluttering".  However during these episodes, the patient would still be able to respond to his mother albeit with slow responses.  She states that he has certainly been altered speaking some nonsensical speech.  There is been no tongue biting bowel or bladder incontinence.  Mom states that the patient has had some intermittent nausea and vomiting over the past 4 to 5 days.  He is also had some occasional loose stools.  There is been no hematochezia or melena.  He denies any fevers, chills, chest pain, breath, coughing, hemoptysis, headache, neck pain. In the past 1 to 2 days mother has noted increasing generalized weakness to the point where the patient is having difficulty ambulating. Due to these constellation of symptoms, the patient was brought to the emergency department for further evaluation and treatment. In the ED, the patient was afebrile and hemodynamically stable with oxygen saturation 100% room air.   WBC 10.8, hemoglobin 17.6, platelets 291,000.  Sodium 140, potassium 2.8, bicarbonate 26, BUN 40, creatinine 0.95.  AST 21, ALT 16, alk phosphatase 55, total bilirubin 1.8.  COVID-19 PCR was negative.  Alcohol level was negative.  EKG shows sinus rhythm with nonspecific T wave changes.  CT of the brain was negative for acute findings.  He was given 2 L normal saline and his potassium was repleted. The patient was seen by psychiatry on a virtual platform.  They did not feel the patient met criteria for inpatient psychiatric admission.  EDP spoke with neurology who felt the patient needed neurologic workup including MRI and EEG.  Unfortunately, these modalities are not available at [facility] presently.  As result, patient has been transferred to [facility] for further evaluation and treatment of his encephalopathy and symptomatic withdrawal.  On 12/31, pt is awake, alert and conversant and following commands.  He still has some delay in his responses, but better than 12/30.  Still a little confused, but less than 12/30.   Assessment/Plan: Acute metabolic/toxic encephalopathy -Certainly, his constellation of symptoms may be certainly attributable to symptomatic withdrawal from his illicit substance use versus direct effects from his illicit substances -79/02--IOXB alert and conversant, but still delayed responses;  Still confused but better than 12/30 -no rigidity on exam with PROM -MRI brain -EEG -B12--197 -TSH--4.474 -DZHGDJM--42 -Folic ASTM--19.6 -Thiamine level--pending;  now on empiric supplementation -CPK-391 -continue COWS withdrawal protocol   Volume depletion/dehydration -continue IV fluids -still no significant po intake but n/v are improving   Hypokalemia -Repleted -Check  magnesium--3.0   Tremors -Suspect this is partly due to his withdrawal   Hyperbilirubinemia -Fractionate bili>>mostly indirect -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  No BM  Objective: Vitals:   03/20/22 0850 03/20/22 0900 03/20/22 0930 03/20/22 1000  BP:  (!) 146/82 137/84 (!) 142/81  Pulse:  65 72 64  Resp:  20 18 (!) 28  Temp: 98.6 F (37 C)     TempSrc: Oral     SpO2:  99% 100% 100%  Weight:      Height:        Intake/Output Summary (Last 24 hours) at 03/20/2022 1153 Last data filed at 03/20/2022 0504 Gross per 24 hour  Intake 2000 ml  Output 1600 ml  Net 400 ml   Weight change:  Exam:  General:  Pt is alert, follows commands appropriately, not in acute distress HEENT: No icterus, No thrush, No neck mass, [place]/AT Cardiovascular: RRR, S1/S2, no rubs, no gallops Respiratory: diminished BS.  Bibasilar rales.  No wheeze Abdomen: Soft/+BS, non tender, non distended, no guarding Extremities: No edema, No lymphangitis, No petechiae, No rashes, no synovitis   Data Reviewed: I have personally reviewed following labs and imaging studies Basic Metabolic Panel: Recent Labs  Lab 03/18/22 2027 03/18/22 2031 03/20/22 0111 03/20/22 0559  NA  --  140 141 144  K  --  2.9* 3.7 3.7  CL  --  97* 111 114*  CO2  --  _0 GLUCOSE  --  141* 108* 106*  BUN  --  40* 28* 29*  CREATININE  --  0.95 0.82 0.80  CALCIUM  --  9.6 8.5* 8.8*  MG 3.0*  --   --   --    Liver Function Tests: Recent Labs  Lab 03/18/22 2027 03/19/22 1648 03/20/22 0111 03/20/22 0559  AST 21  --  19 18  ALT 16  --  15 16  ALKPHOS 55  --  41 40  BILITOT 1.8* 1.9* 1.5* 1.7*  PROT 8.7*  --  6.6  6.3*  ALBUMIN 5.2*  --  3.9 3.8   No results for input(s): "LIPASE", "AMYLASE" in the last 168 hours. Recent Labs  Lab 03/19/22 1648  AMMONIA 36*   Coagulation Profile: No results for input(s): "INR", "PROTIME" in the last 168 hours. CBC: Recent Labs  Lab 03/18/22 2031 03/20/22 0559  WBC 10.8* 7.4  NEUTROABS 7.5  --   HGB 17.6* 14.0  HCT 51.1 42.2  MCV 83.2 85.8  PLT 391 232   Cardiac Enzymes: Recent Labs  Lab 03/19/22 1648  CKTOTAL 391   BNP: Invalid input(s): "POCBNP" CBG: No results for input(s): "GLUCAP" in the last 168 hours. HbA1C: No results for input(s): "HGBA1C" in the last 72 hours. Urine analysis:    Component Value Date/Time   COLORURINE YELLOW 03/19/2022 [place] 03/19/2022 1654   LABSPEC 1.029 03/19/2022 1654   PHURINE 7.0 03/19/2022 1654   GLUCOSEU NEGATIVE 03/19/2022 1654   GLUCOSEU NEGATIVE 12/20/2018 1706   HGBUR  NEGATIVE 03/19/2022 [place] 03/19/2022 1654   KETONESUR 20 (A) 03/19/2022 1654   PROTEINUR 100 (A) 03/19/2022 1654   UROBILINOGEN 0.2 12/20/2018 1706   NITRITE NEGATIVE 03/19/2022 1654   LEUKOCYTESUR NEGATIVE 03/19/2022 1654   Sepsis Labs: _0 (procalcitonin:4,lacticidven:4) ) Recent Results (from the past 240 hour(s))  Resp panel by RT-PCR (RSV, Flu A&B, Covid) Anterior Nasal Swab     Status: None   Collection Time: 03/18/22 11:58 PM   Specimen: Anterior Nasal Swab  Result Value Ref Range Status   SARS Coronavirus 2 by RT PCR NEGATIVE NEGATIVE Final    Comment: (NOTE) SARS-CoV-2 target nucleic acids are NOT DETECTED.  The SARS-CoV-2 RNA is generally detectable in upper respiratory specimens during the acute phase of infection. The lowest concentration of SARS-CoV-2 viral copies this assay can detect is 138 copies/mL. A negative result does not preclude SARS-Cov-2 infection and should not be used as the sole basis for treatment or other patient management decisions. A negative  result may occur with  improper specimen collection/handling, submission of specimen other than nasopharyngeal swab, presence of viral mutation(s) within the areas targeted by this assay, and inadequate number of viral copies(<138 copies/mL). A negative result must be combined with clinical observations, patient history, and epidemiological information. The expected result is Negative.  Fact Sheet for Patients:  EntrepreneurPulse.com.au  Fact Sheet for Healthcare Providers:  IncredibleEmployment.be  This test is no t yet approved or cleared by the [place] FDA and  has been authorized for detection and/or diagnosis of SARS-CoV-2 by FDA under an Emergency Use Authorization (EUA). This EUA will remain  in effect (meaning this test can be used) for the duration of the COVID-19 declaration under Section 564(b)(1) of the Act, 21 U.S.C.section 360bbb-3(b)(1), unless the authorization is terminated  or revoked sooner.       Influenza A by PCR NEGATIVE NEGATIVE Final   Influenza B by PCR NEGATIVE NEGATIVE Final    Comment: (NOTE) The Xpert Xpress SARS-CoV-2/FLU/RSV plus assay is intended as an aid in the diagnosis of influenza from Nasopharyngeal swab specimens and should not be used as a sole basis for treatment. Nasal washings and aspirates are unacceptable for Xpert Xpress SARS-CoV-2/FLU/RSV testing.  Fact Sheet for Patients: EntrepreneurPulse.com.au  Fact Sheet for Healthcare Providers: IncredibleEmployment.be  This test is not yet approved or cleared by the [place] FDA and has been authorized for detection and/or diagnosis of SARS-CoV-2 by FDA under an Emergency Use Authorization (EUA). This EUA will remain in effect (meaning this test can be used) for the duration of the COVID-19 declaration under Section 564(b)(1) of the Act, 21 U.S.C. section 360bbb-3(b)(1), unless the authorization is  terminated or revoked.     Resp Syncytial Virus by PCR NEGATIVE NEGATIVE Final    Comment: (NOTE) Fact Sheet for Patients: EntrepreneurPulse.com.au  Fact Sheet for Healthcare Providers: IncredibleEmployment.be  This test is not yet approved or cleared by the [place] FDA and has been authorized for detection and/or diagnosis of SARS-CoV-2 by FDA under an Emergency Use Authorization (EUA). This EUA will remain in effect (meaning this test can be used) for the duration of the COVID-19 declaration under Section 564(b)(1) of the Act, 21 U.S.C. section 360bbb-3(b)(1), unless the authorization is terminated or revoked.  Performed at [facility], [address] [place], [place] [address]      Scheduled Meds:  cloNIDine  0.1 mg Oral QID   Followed by   Derrill Memo ON 03/21/2022] cloNIDine  0.1 mg Oral BH-qamhs  Followed by   Derrill Memo ON 03/23/2022] cloNIDine  0.1 mg Oral QAC breakfast   enoxaparin (LOVENOX) injection  40 mg Subcutaneous Q24H   multivitamin with minerals  1 tablet Oral Daily   thiamine  100 mg Oral Daily   Continuous Infusions:  lactated ringers 100 mL/hr at 03/20/22 0259    Procedures/Studies: CT HEAD WO CONTRAST (5MM)  Result Date: 03/19/2022 CLINICAL DATA:  Mental status change, unknown cause EXAM: CT HEAD WITHOUT CONTRAST TECHNIQUE: Contiguous axial images were obtained from the base of the skull through the vertex without intravenous contrast. RADIATION DOSE REDUCTION: This exam was performed according to the departmental dose-optimization program which includes automated exposure control, adjustment of the mA and/or kV according to patient size and/or use of iterative reconstruction technique. COMPARISON:  07/18/2018. FINDINGS: Brain: No evidence of acute infarction, hemorrhage, hydrocephalus, extra-axial collection or mass lesion/mass effect. Vascular: No hyperdense vessel or unexpected calcification. Skull: Normal. Negative for  fracture or focal lesion. Sinuses/Orbits: No acute finding. IMPRESSION: No acute intracranial process. Electronically Signed   By: Sammie Bench M.D.   On: 03/19/2022 10:15    Orson Eva, DO  [facility]  If 7PM-7AM, please contact night-coverage [email or web address] Password [email or web address] 03/20/2022, 11:53 AM   LOS: 1 day

## 2022-03-20 NOTE — ED Notes (Signed)
Patient given a sprite at this time. 

## 2022-03-20 NOTE — ED Notes (Signed)
Urinal placed within reach of pt

## 2022-03-20 NOTE — ED Notes (Signed)
Dr Wilkie Aye at bedside to evaluate pt

## 2022-03-20 NOTE — TOC Progression Note (Signed)
  Transition of Care [facility]) Screening Note   Patient Details  Name: Nicholas Harrison Date of Birth: 1990-08-01   Transition of Care [facility]) CM/SW Contact:    Leitha Bleak, RN Phone Number: 03/20/2022, 3:01 PM   Awaiting transfer to [facility].   Transition of Care Department [facility]) has reviewed patient and no TOC needs have been identified at this time. We will continue to monitor patient advancement through interdisciplinary progression rounds. If new patient transition needs arise, please place a TOC consult.       Barriers to Discharge: Continued Medical Work up

## 2022-03-21 ENCOUNTER — Inpatient Hospital Stay (HOSPITAL_COMMUNITY): Payer: BC Managed Care – PPO

## 2022-03-21 DIAGNOSIS — G929 Unspecified toxic encephalopathy: Secondary | ICD-10-CM

## 2022-03-21 DIAGNOSIS — E860 Dehydration: Secondary | ICD-10-CM | POA: Insufficient documentation

## 2022-03-21 DIAGNOSIS — R4182 Altered mental status, unspecified: Secondary | ICD-10-CM | POA: Diagnosis not present

## 2022-03-21 LAB — URINE CULTURE: Culture: NO GROWTH

## 2022-03-21 LAB — CBC
HCT: 42.2 % (ref 39.0–52.0)
Hemoglobin: 14 g/dL (ref 13.0–17.0)
MCH: 28.5 pg (ref 26.0–34.0)
MCHC: 33.2 g/dL (ref 30.0–36.0)
MCV: 85.8 fL (ref 80.0–100.0)
Platelets: 232 10*3/uL (ref 150–400)
RBC: 4.92 MIL/uL (ref 4.22–5.81)
RDW: 13.4 % (ref 11.5–15.5)
WBC: 7.4 10*3/uL (ref 4.0–10.5)
nRBC: 0 % (ref 0.0–0.2)

## 2022-03-21 MED ORDER — PNEUMOCOCCAL VAC POLYVALENT 25 MCG/0.5ML IJ INJ
0.5000 mL | INJECTION | INTRAMUSCULAR | Status: DC
  Filled 2022-03-21: qty 0.5

## 2022-03-21 MED ORDER — INFLUENZA VAC A&B SA ADJ QUAD 0.5 ML IM PRSY
0.5000 mL | PREFILLED_SYRINGE | INTRAMUSCULAR | Status: AC
  Administered 2022-03-22: 0.5 mL via INTRAMUSCULAR
  Filled 2022-03-21: qty 0.5

## 2022-03-21 NOTE — Assessment & Plan Note (Addendum)
IV fluids given - Push oral fluids

## 2022-03-21 NOTE — Progress Notes (Signed)
  Progress Note   Patient: Nicholas Harrison YSH:[id number] DOB: [birth year] DOA: 03/18/2022     2 DOS: the patient was seen and examined on 03/21/2022 at 9:10AM and 3:15PM      Brief hospital course: Mr. Kollmann is a 32 y.o. M with hx depression/anxiety, polysubstance abuse who presented with few days N/V and altered mental status.    Had been snorting fentanyl and using Xanax and Adderall off the street and THC, last about 6 days PTA.    4-5 days PTA mother noted tremors and "eyes were fluttering" and responses were appropriate but obviously slowed, as well as other times responses were nonsensical.    In the ER, VS normal, WBC normal, K 2.8, LFTS normal.  CTH normal.    Given fluids and evaluated by psychiatry who felt he had a medical issue.  Next steps in work up were MRI and EEG and so he was transferred to [facility].   12/30: Admitted, psychiatry consulted 12/31: Slightly less confused, transfer ordered 1/1: Transferred to [facility]      Assessment and Plan: * Acute toxic encephalopathy CT head normal, MRI brain degraded by braces, but otherwise no acute change.  EEG normal.  Patient improving day by day here, just with fluids.  No concern for metabolic encephalopathy from organ failure, infection or electrolyte derangement.  Suspect this is all just withdrawal from opiates and benzodiazepines, possibly some contribution from adulteration of his fentanyl (likely).  Dehydration IV fluids given - Push oral fluids  Opiate withdrawal (HCC) - Clonidine taper - Hydroxyzine - Ondansetron  Hypokalemia - Supplement K  Benzodiazepine dependence, episodic (HCC) CIWAs relatively low 3-4 - Continue CIWA scoring          Subjective: Feeling better.  Very unsteady.  No fever, cough, vomiting.     Physical Exam: BP 116/68 (BP Location: Left Arm)   Pulse 60   Temp 98.6 F (37 C) (Oral)   Resp 20   Ht 5\' 9"  (1.753 m)   Wt 77.1 kg   SpO2 99%   BMI 25.10 kg/m   Well-nourished  adult male, sitting in bed, interactive and appropriate Tries to stand and loses his balance, face symmetric, speech fluent, tremor in the face Cardiorespiratory exam normal   Data Reviewed: EEG report reviewed MRI brain report reviewed CMP unremarkable CBC normal  Family Communication: Mother at the bedside    Disposition: Status is: Inpatient Patient is severely ataxic and not ready for discharge, will need physical therapy evaluation        Author: Edwin Dada, MD 03/21/2022 6:11 PM  For on call review [email or web address].

## 2022-03-21 NOTE — Assessment & Plan Note (Addendum)
CIWAs relatively low 3-4 - Continue CIWA scoring

## 2022-03-21 NOTE — Assessment & Plan Note (Signed)
-   Clonidine taper - Hydroxyzine - Ondansetron

## 2022-03-21 NOTE — Assessment & Plan Note (Signed)
CT head normal, MRI brain degraded by braces, but otherwise no acute change.  EEG normal.  Patient improving day by day here, just with fluids.  No concern for metabolic encephalopathy from organ failure, infection or electrolyte derangement.  Suspect this is all just withdrawal from opiates and benzodiazepines, possibly some contribution from adulteration of his fentanyl (likely).

## 2022-03-21 NOTE — Evaluation (Signed)
Physical Therapy Evaluation Patient Details Name: Nicholas Harrison MRN: [id number] DOB: 1990-07-26 Today's Date: 03/21/2022  History of Present Illness  Pt is a 32 y.o. M who presents 03/18/2022 with 5 day history of intermittent nausea, vomiting and AMS. Pt admits to polysubstance use. In the past 1-2 days, mother has noted increasing generalized weakness and difficulty ambulating. Normal brain MRI. Significant PMH: MDD, polysubstance abuse.  Clinical Impression  Pt admitted with above. Pt presents with deficits in static and dynamic balance and core strength. Pt ambulating ~120 ft with no assistive device at a min-mod assist level. With balance challenge I.e. head turn or pivots, pt with lateral vs posterior loss of balance, requiring assist to correct. Also a factor of decreased safety awareness and insight into deficits. Pt negotiated 2 steps with handrail support. Demonstrates improvement in balance with RW vs without external support. Unsure if pt will be compliant to use. Will continue to follow acutely.     Recommendations for follow up therapy are one component of a multi-disciplinary discharge planning process, led by the attending physician.  Recommendations may be updated based on patient status, additional functional criteria and insurance authorization.  Follow Up Recommendations Home health PT      Assistance Recommended at Discharge Frequent or constant Supervision/Assistance  Patient can return home with the following  A little help with walking and/or transfers;Assistance with cooking/housework;Assist for transportation;Help with stairs or ramp for entrance    Equipment Recommendations Rolling walker (2 wheels)  Recommendations for Other Services       Functional Status Assessment Patient has had a recent decline in their functional status and demonstrates the ability to make significant improvements in function in a reasonable and predictable amount of time.      Precautions / Restrictions Precautions Precautions: Fall Restrictions Weight Bearing Restrictions: No      Mobility  Bed Mobility Overal bed mobility: Needs Assistance Bed Mobility: Supine to Sit     Supine to sit: Supervision          Transfers Overall transfer level: Needs assistance Equipment used: None Transfers: Sit to/from Stand Sit to Stand: Min guard                Ambulation/Gait Ambulation/Gait assistance: Min assist, Mod assist Gait Distance (Feet): 120 Feet Assistive device: None, Rolling walker (2 wheels) Gait Pattern/deviations: Step-through pattern, Decreased stride length, Drifts right/left Gait velocity: decreased     General Gait Details: Pt requiring minA with level surface negotiation, modA with challenge or distraction i.e. head turn or pivots. Improvement with RW  Stairs Stairs: Yes Stairs assistance: Min assist Stair Management: Two rails Number of Stairs: 2 General stair comments: step over step pattern  Wheelchair Mobility    Modified Rankin (Stroke Patients Only)       Balance Overall balance assessment: Needs assistance Sitting-balance support: Feet supported Sitting balance-Leahy Scale: Good     Standing balance support: No upper extremity supported, During functional activity Standing balance-Leahy Scale: Fair                               Pertinent Vitals/Pain Pain Assessment Pain Assessment: No/denies pain    Home Living Family/patient expects to be discharged to:: Private residence Living Arrangements: Parent (mother initially) Available Help at Discharge: Family Type of Home: House         Home Layout: Multi-level   Additional Comments: Pt plans to stay with his mother for  the first day, and then unclear disposition    Prior Function Prior Level of Function : Independent/Modified Independent             Mobility Comments: works in [employer], corporate         Extremity/Trunk Assessment   Upper Extremity Assessment Upper Extremity Assessment: Overall WFL for tasks assessed    Lower Extremity Assessment Lower Extremity Assessment: Overall WFL for tasks assessed    Cervical / Trunk Assessment Cervical / Trunk Assessment: Normal  Communication   Communication: No difficulties  Cognition Arousal/Alertness: Awake/alert Behavior During Therapy: WFL for tasks assessed/performed Overall Cognitive Status: Impaired/Different from baseline Area of Impairment: Safety/judgement                         Safety/Judgement: Decreased awareness of safety, Decreased awareness of deficits     General Comments: decreased balance reactions        General Comments      Exercises     Assessment/Plan    PT Assessment Patient needs continued PT services  PT Problem List Decreased activity tolerance;Decreased balance;Decreased mobility;Decreased safety awareness       PT Treatment Interventions DME instruction;Gait training;Stair training;Functional mobility training;Therapeutic activities;Therapeutic exercise;Balance training;Patient/family education    PT Goals (Current goals can be found in the Care Plan section)  Acute Rehab PT Goals Patient Stated Goal: get stronger PT Goal Formulation: With patient Time For Goal Achievement: 04/04/22 Potential to Achieve Goals: Good    Frequency Min 3X/week     Co-evaluation               AM-PAC PT "6 Clicks" Mobility  Outcome Measure Help needed turning from your back to your side while in a flat bed without using bedrails?: None Help needed moving from lying on your back to sitting on the side of a flat bed without using bedrails?: A Little Help needed moving to and from a bed to a chair (including a wheelchair)?: A Little Help needed standing up from a chair using your arms (e.g., wheelchair or bedside chair)?: A Little Help needed to walk in hospital room?: A Lot Help  needed climbing 3-5 steps with a railing? : A Little 6 Click Score: 18    End of Session Equipment Utilized During Treatment: Gait belt Activity Tolerance: Patient tolerated treatment well Patient left: in bed;with bed alarm set;with call bell/phone within reach Nurse Communication: Mobility status PT Visit Diagnosis: Unsteadiness on feet (R26.81)    Time: 4967-5916 PT Time Calculation (min) (ACUTE ONLY): 23 min   Charges:   PT Evaluation $PT Eval Low Complexity: 1 Low PT Treatments $Therapeutic Activity: 8-22 mins        Wyona Almas, PT, DPT Acute Rehabilitation Services Office [phone]   Deno Etienne 03/21/2022, 2:56 PM

## 2022-03-21 NOTE — Progress Notes (Signed)
EEG complete - results pending 

## 2022-03-21 NOTE — Progress Notes (Addendum)
Patient arrived to the unit from [facility] ED, alert and oriented X4, appears tired and drowsy. Follows command. No family at bedside. Cardiac tele connected to CCMD. Bed alarms on. Call button within reach.

## 2022-03-21 NOTE — Assessment & Plan Note (Signed)
Resolved with supplementation and starting spironolactone. 

## 2022-03-21 NOTE — Procedures (Signed)
Patient Name: Nicholas Harrison  MRN: [id number]  Epilepsy Attending: Lora Havens  Referring Physician/Provider: Orson Eva, MD  Date: 03/21/2022  Duration: 22.40 mins  Patient history: 32yo M with ams. EEG to evaluate for seizure.  Level of alertness: Awake  AEDs during EEG study: None  Technical aspects: This EEG study was done with scalp electrodes positioned according to the 10-20 International system of electrode placement. Electrical activity was reviewed with band pass filter of 1-70Hz , sensitivity of 7 uV/mm, display speed of 49mm/sec with a 60Hz  notched filter applied as appropriate. EEG data were recorded continuously and digitally stored.  Video monitoring was available and reviewed as appropriate.  Description: The posterior dominant rhythm consists of 8Hz  activity of moderate voltage (25-35 uV) seen predominantly in posterior head regions, symmetric and reactive to eye opening and eye closing. Hyperventilation did not show any EEG change.  Physiologic photic driving was not seen during photic stimulation.    IMPRESSION: This study is within normal limits. No seizures or epileptiform discharges were seen throughout the recording.  Gerry Heaphy Barbra Sarks

## 2022-03-22 DIAGNOSIS — G929 Unspecified toxic encephalopathy: Secondary | ICD-10-CM | POA: Diagnosis not present

## 2022-03-22 MED ORDER — ONDANSETRON HCL 4 MG PO TABS: 4.0000 mg | ORAL_TABLET | Freq: Every day | ORAL | 1 refills | Status: AC | PRN

## 2022-03-22 NOTE — Discharge Summary (Signed)
Physician Discharge Summary   Nicholas: Nicholas Harrison MRN: [id number] DOB: [birth year]  Admit date:     03/18/2022  Discharge date: 03/22/22  Discharge Physician: Alberteen Sam   PCP: Tresa Garter, MD     Recommendations at discharge:  Follow up with PCP Dr. Posey Rea in 1-2 weeks Follow up with substance use treatment center     Discharge Diagnoses: Principal Problem:   Acute toxic encephalopathy Active Problems:   Benzodiazepine dependence, episodic (HCC)   Hypokalemia   Opiate withdrawal (HCC)   Dehydration     Hospital Course: Nicholas Harrison is a 32 y.o. M with hx depression/anxiety, polysubstance abuse who presented with few days N/V and altered mental status.    Had been snorting fentanyl and using Xanax and Adderall off the street and THC, last about 6 days PTA.    4-5 days PTA mother noted tremors and "eyes were fluttering" and responses were appropriate but obviously slowed, as well as other times responses were nonsensical.    In the ER, VS normal, WBC normal, K 2.8, LFTS normal.  CTH normal.    Given fluids and evaluated by psychiatry who felt he had a medical issue.  Next steps in work up were MRI and EEG and so he was [facility] to [facility].     * Acute toxic encephalopathy CT head normal, MRI brain degraded by braces, but otherwise no acute change.  EEG normal.  Nicholas improving day by day here, just with fluids.  No concern for metabolic encephalopathy from organ failure, infection or electrolyte derangement.  Suspect this is all just withdrawal from opiates and benzodiazepines, possibly some contribution from adulteration of his fentanyl (likely).  Possible opiate and benzodiazepines withdrawal  Treated with Clonidine taper, hydroxyzine, symptoms resolved by 4th day.   Recommended substance use treatment, Nicholas motivated.  Hypokalemia Supplemented  Benzodiazepine dependence, episodic (HCC) No seizures.           The [facility] Controlled Substances Registry was reviewed for this Nicholas prior to discharge.  Consultants: Psychiatry Procedures performed: EEG, MRI brain, CT head  Disposition: Home Diet recommendation:  Discharge Diet Orders (From admission, onward)     Start     Ordered   03/22/22 0000  Diet - low sodium heart healthy        03/22/22 1151             DISCHARGE MEDICATION: Allergies as of 03/22/2022       Reactions   Quetiapine Fumarate Other (See Comments)   Sleepy, vivid dreams        Medication List     TAKE these medications    ondansetron 4 MG tablet Commonly known as: Zofran Take 1 tablet (4 mg total) by mouth daily as needed for nausea or vomiting. Notes to Nicholas: Last dose given 12/29 at 1200 AM               Durable Medical Equipment  (From admission, onward)           Start     Ordered   03/22/22 1025  For home use only DME Walker rolling  Once       Question Answer Comment  Walker: With 5 Inch Wheels   Nicholas needs a walker to treat with the following condition Weakness      03/22/22 1025            Follow-up Information     [facility] Follow up.  Specialty: Rehabilitation Why: The outpatient therapy will contact you for the first appointment Contact information: [place] [id number] [place]        Plotnikov, Evie Lacks, MD. Schedule an appointment as soon as possible for a visit in 1 week(s).   Specialty: Internal Medicine Contact information: [place] [address] [phone]                 Discharge Instructions     Ambulatory referral to Physical Therapy   Complete by: As directed    Diet - low sodium heart healthy   Complete by: As directed    Discharge instructions   Complete by: As directed    **IMPORTANT DISCHARGE INSTRUCTIONS**   From Dr. Loleta Books: You were admitted for confusion  and tremor. Here, we did a CT scan of the head (imaging study), an MRI of the brain (a more detailed, higher resolution imaging study of the brain than a CT), and an EEG (a test of the electrical activity of the brain to look for seizures or seizure-like activity)  These were all normal Your blood testing of heart, liver and kidney function were normal, blood counts normal and electrolytes (salts in blood) normal  We suspect your symptoms were from some other substance that adulterated your street drugs, or from withdrawal from the drugs  Completely abstain Follow up with a substance use treatment center as we discussed  Best of luck in your recovery  If you have some nausea, you may try ondansetron 4 mg tablets You can take these up to every 6 hours If you take more than 3 in a row, go to the hospital   You might have a prescription for clonidine sent to your pharmacy by the ER, you can dispose of this, it was for withdrawal, and you don't need it anymore  With regard to driving: I recommend you not drive until you follow up with Dr. Alain Marion in 1 week   Increase activity slowly   Complete by: As directed        Discharge Exam: Filed Weights   03/18/22 1517  Weight: 77.1 kg    General: Pt is alert, awake, not in acute distress Cardiovascular: RRR, nl S1-S2, no murmurs appreciated.   No LE edema.   Respiratory: Normal respiratory rate and rhythm.  CTAB without rales or wheezes. Abdominal: Abdomen soft and non-tender.  No distension or HSM.   Neuro/Psych: Strength symmetric in upper and lower extremities.  Judgment and insight appear normal.   Condition at discharge: good  The results of significant diagnostics from this hospitalization (including imaging, microbiology, ancillary and laboratory) are listed below for reference.   Imaging Studies: EEG adult  Result Date: 03/28/22 Lora Havens, MD     2022/03/28 10:45 AM Nicholas Harrison MRN: [id number]  Epilepsy Attending: Lora Havens Referring Physician/Provider: Orson Eva, MD Date: 03-28-22 Duration: 22.40 mins Nicholas history: 32yo M with ams. EEG to evaluate for seizure. Level of alertness: Awake AEDs during EEG study: None Technical aspects: This EEG study was done with scalp electrodes positioned according to the 10-20 International system of electrode placement. Electrical activity was reviewed with band pass filter of 1-70Hz , sensitivity of 7 uV/mm, display speed of 86mm/sec with a 60Hz  notched filter applied as appropriate. EEG data were recorded continuously and digitally stored.  Video monitoring was available and reviewed as appropriate. Description: The posterior dominant rhythm consists of 8Hz   activity of moderate voltage (25-35 uV) seen predominantly in posterior head regions, symmetric and reactive to eye opening and eye closing. Hyperventilation did not show any EEG change.  Physiologic photic driving was not seen during photic stimulation.  IMPRESSION: This study is within normal limits. No seizures or epileptiform discharges were seen throughout the recording. Lora Havens   MR BRAIN WO CONTRAST  Result Date: 03/20/2022 CLINICAL DATA:  Altered mental status EXAM: MRI HEAD WITHOUT CONTRAST TECHNIQUE: Multiplanar, multiecho pulse sequences of the brain and surrounding structures were obtained without intravenous contrast. COMPARISON:  Head CT 03/19/2022 FINDINGS: Brain: Severe artifacts caused by dental braces. Visible parts of the brain are normal. No visible hemorrhage or extra-axial collection. Vascular: Visualized flow voids are normal. Skull and upper cervical spine: Normal bone marrow signal IMPRESSION: Severe artifacts caused by dental braces. Within that limitation, normal brain MRI. Electronically Signed   By: Ulyses Jarred M.D.   On: 03/20/2022 23:07   CT HEAD WO CONTRAST (5MM)  Result Date: 03/19/2022 CLINICAL DATA:  Mental status change, unknown cause EXAM: CT HEAD  WITHOUT CONTRAST TECHNIQUE: Contiguous axial images were obtained from the base of the skull through the vertex without intravenous contrast. RADIATION DOSE REDUCTION: This exam was performed according to the departmental dose-optimization program which includes automated exposure control, adjustment of the mA and/or kV according to Nicholas size and/or use of iterative reconstruction technique. COMPARISON:  07/18/2018. FINDINGS: Brain: No evidence of acute infarction, hemorrhage, hydrocephalus, extra-axial collection or mass lesion/mass effect. Vascular: No hyperdense vessel or unexpected calcification. Skull: Normal. Negative for fracture or focal lesion. Sinuses/Orbits: No acute finding. IMPRESSION: No acute intracranial process. Electronically Signed   By: Sammie Bench M.D.   On: 03/19/2022 10:15    Microbiology: Results for orders placed or performed during the hospital encounter of 03/18/22  Resp panel by RT-PCR (RSV, Flu A&B, Covid) Anterior Nasal Swab     Status: None   Collection Time: 03/18/22 11:58 PM   Specimen: Anterior Nasal Swab  Result Value Ref Range Status   SARS Coronavirus 2 by RT PCR NEGATIVE NEGATIVE Final    Comment: (NOTE) SARS-CoV-2 target nucleic acids are NOT DETECTED.  The SARS-CoV-2 RNA is generally detectable in upper respiratory specimens during the acute phase of infection. The lowest concentration of SARS-CoV-2 viral copies this assay can detect is 138 copies/mL. A negative result does not preclude SARS-Cov-2 infection and should not be used as the sole basis for treatment or other Nicholas management decisions. A negative result may occur with  improper specimen collection/handling, submission of specimen other than nasopharyngeal swab, presence of viral mutation(s) within the areas targeted by this assay, and inadequate number of viral copies(<138 copies/mL). A negative result must be combined with clinical observations, Nicholas history, and  epidemiological information. The expected result is Negative.  Fact Sheet for Patients:  EntrepreneurPulse.com.au  Fact Sheet for Healthcare Providers:  IncredibleEmployment.be  This test is no t yet approved or cleared by the [place] FDA and  has been authorized for detection and/or diagnosis of SARS-CoV-2 by FDA under an Emergency Use Authorization (EUA). This EUA will remain  in effect (meaning this test can be used) for the duration of the COVID-19 declaration under Section 564(b)(1) of the Act, 21 U.S.C.section 360bbb-3(b)(1), unless the authorization is terminated  or revoked sooner.       Influenza A by PCR NEGATIVE NEGATIVE Final   Influenza B by PCR NEGATIVE NEGATIVE Final    Comment: (NOTE) The Xpert Xpress  SARS-CoV-2/FLU/RSV plus assay is intended as an aid in the diagnosis of influenza from Nasopharyngeal swab specimens and should not be used as a sole basis for treatment. Nasal washings and aspirates are unacceptable for Xpert Xpress SARS-CoV-2/FLU/RSV testing.  Fact Sheet for Patients: BloggerCourse.com  Fact Sheet for Healthcare Providers: SeriousBroker.it  This test is not yet approved or cleared by the [place] FDA and has been authorized for detection and/or diagnosis of SARS-CoV-2 by FDA under an Emergency Use Authorization (EUA). This EUA will remain in effect (meaning this test can be used) for the duration of the COVID-19 declaration under Section 564(b)(1) of the Act, 21 U.S.C. section 360bbb-3(b)(1), unless the authorization is terminated or revoked.     Resp Syncytial Virus by PCR NEGATIVE NEGATIVE Final    Comment: (NOTE) Fact Sheet for Patients: BloggerCourse.com  Fact Sheet for Healthcare Providers: SeriousBroker.it  This test is not yet approved or cleared by the [place] FDA and has been  authorized for detection and/or diagnosis of SARS-CoV-2 by FDA under an Emergency Use Authorization (EUA). This EUA will remain in effect (meaning this test can be used) for the duration of the COVID-19 declaration under Section 564(b)(1) of the Act, 21 U.S.C. section 360bbb-3(b)(1), unless the authorization is terminated or revoked.  Performed at [facility], [address], [place], [place] [address]   Urine Culture     Status: None   Collection Time: 03/19/22  4:54 PM   Specimen: Urine, Clean Catch  Result Value Ref Range Status   Specimen Description   Final    URINE, CLEAN CATCH Performed at [facility], [address], [place], [place] [address]    Special Requests   Final    NONE Performed at [facility], [address], [place], [place] [address]    Culture   Final    NO GROWTH Performed at [facility], [address] [place], [place] [address]    Report Status 03/21/2022 FINAL  Final    Labs: CBC: Recent Labs  Lab 03/18/22 2031 03/20/22 0559  WBC 10.8* 7.4  NEUTROABS 7.5  --   HGB 17.6* 14.0  HCT 51.1 42.2  MCV 83.2 85.8  PLT 391 232   Basic Metabolic Panel: Recent Labs  Lab 03/18/22 2027 03/18/22 2031 03/20/22 0111 03/20/22 0559  NA  --  140 141 144  K  --  2.9* 3.7 3.7  CL  --  97* 111 114*  CO2  --  26 24 24   GLUCOSE  --  141* 108* 106*  BUN  --  40* 28* 29*  CREATININE  --  0.95 0.82 0.80  CALCIUM  --  9.6 8.5* 8.8*  MG 3.0*  --   --   --    Liver Function Tests: Recent Labs  Lab 03/18/22 2027 03/19/22 1648 03/20/22 0111 03/20/22 0559  AST 21  --  19 18  ALT 16  --  15 16  ALKPHOS 55  --  41 40  BILITOT 1.8* 1.9* 1.5* 1.7*  PROT 8.7*  --  6.6 6.3*  ALBUMIN 5.2*  --  3.9 3.8   CBG: No results for input(s): "GLUCAP" in the last 168 hours.  Discharge time spent: approximately 35 minutes spent on discharge counseling, evaluation of Nicholas on day of discharge, and coordination of discharge planning with nursing, social work,  pharmacy and case management  Signed: 03/22/22, MD [facility] 03/22/2022

## 2022-03-22 NOTE — TOC Transition Note (Signed)
Transition of Care [facility]) - CM/SW Discharge Note   Patient Details  Name: Nicholas Harrison MRN: [id number] Date of Birth: Apr 23, 1990  Transition of Care [facility]) CM/SW Contact:  Pollie Friar, RN Phone Number: 03/22/2022, 10:53 AM   Clinical Narrative:    Pt is discharging home with outpatient therapy arranged through [facility] outpatient therapy. Information on the AVS.  Walker ordered through [facility] and will be delivered to the room. Pt states he has transportation needed. He denies any issues with home medications. CM has provided him resources for inpatient/ outpatient drug counseling. Family to provide transport home.   Final next level of care: OP Rehab Barriers to Discharge: No Barriers Identified   Patient Goals and CMS Choice CMS Medicare.gov Compare Post Acute Care list provided to:: Patient Choice offered to / list presented to : Patient  Discharge Placement                         Discharge Plan and Services Additional resources added to the After Visit Summary for                  DME Arranged: Walker rolling DME Agency: [facility] Date DME Agency Contacted: 03/22/22   Representative spoke with at DME Agency: [facility] Determinants of Health ([place]) Interventions SDOH Screenings   Food Insecurity: No Food Insecurity (03/21/2022)  Housing: Low Risk  (03/21/2022)  Transportation Needs: No Transportation Needs (03/21/2022)  Utilities: Not At Risk (03/21/2022)  Alcohol Screen: Low Risk  (12/28/2018)  Tobacco Use: Medium Risk (03/19/2022)     Readmission Risk Interventions     No data to display

## 2022-03-24 LAB — VITAMIN B1: Vitamin B1 (Thiamine): 182.8 nmol/L (ref 66.5–200.0)

## 2022-03-29 DIAGNOSIS — G4700 Insomnia, unspecified: Secondary | ICD-10-CM | POA: Diagnosis not present

## 2022-03-29 DIAGNOSIS — F1123 Opioid dependence with withdrawal: Secondary | ICD-10-CM | POA: Diagnosis not present

## 2022-03-29 DIAGNOSIS — F411 Generalized anxiety disorder: Secondary | ICD-10-CM | POA: Diagnosis not present

## 2022-03-29 DIAGNOSIS — F4010 Social phobia, unspecified: Secondary | ICD-10-CM | POA: Diagnosis not present

## 2022-03-29 DIAGNOSIS — Z79899 Other long term (current) drug therapy: Secondary | ICD-10-CM | POA: Diagnosis not present

## 2022-03-31 DIAGNOSIS — F1123 Opioid dependence with withdrawal: Secondary | ICD-10-CM | POA: Diagnosis not present

## 2022-04-06 DIAGNOSIS — F1120 Opioid dependence, uncomplicated: Secondary | ICD-10-CM | POA: Diagnosis not present

## 2022-04-11 DIAGNOSIS — F988 Other specified behavioral and emotional disorders with onset usually occurring in childhood and adolescence: Secondary | ICD-10-CM | POA: Diagnosis not present

## 2022-04-11 DIAGNOSIS — F1190 Opioid use, unspecified, uncomplicated: Secondary | ICD-10-CM | POA: Diagnosis not present

## 2022-04-11 DIAGNOSIS — F411 Generalized anxiety disorder: Secondary | ICD-10-CM | POA: Diagnosis not present

## 2022-04-11 DIAGNOSIS — Z79899 Other long term (current) drug therapy: Secondary | ICD-10-CM | POA: Diagnosis not present

## 2022-04-11 DIAGNOSIS — F332 Major depressive disorder, recurrent severe without psychotic features: Secondary | ICD-10-CM | POA: Diagnosis not present

## 2022-04-13 ENCOUNTER — Ambulatory Visit (HOSPITAL_COMMUNITY): Payer: BC Managed Care – PPO | Admitting: Physical Therapy

## 2022-04-14 DIAGNOSIS — F1120 Opioid dependence, uncomplicated: Secondary | ICD-10-CM | POA: Diagnosis not present

## 2022-04-20 DIAGNOSIS — F1120 Opioid dependence, uncomplicated: Secondary | ICD-10-CM | POA: Diagnosis not present

## 2022-04-27 DIAGNOSIS — F1120 Opioid dependence, uncomplicated: Secondary | ICD-10-CM | POA: Diagnosis not present

## 2022-05-04 DIAGNOSIS — F1123 Opioid dependence with withdrawal: Secondary | ICD-10-CM | POA: Diagnosis not present

## 2022-05-12 DIAGNOSIS — F1121 Opioid dependence, in remission: Secondary | ICD-10-CM | POA: Diagnosis not present

## 2022-05-12 DIAGNOSIS — G4700 Insomnia, unspecified: Secondary | ICD-10-CM | POA: Diagnosis not present

## 2022-05-12 DIAGNOSIS — Z79899 Other long term (current) drug therapy: Secondary | ICD-10-CM | POA: Diagnosis not present

## 2022-06-01 DIAGNOSIS — F1123 Opioid dependence with withdrawal: Secondary | ICD-10-CM | POA: Diagnosis not present

## 2022-06-10 DIAGNOSIS — F1191 Opioid use, unspecified, in remission: Secondary | ICD-10-CM | POA: Diagnosis not present

## 2022-06-10 DIAGNOSIS — Z79899 Other long term (current) drug therapy: Secondary | ICD-10-CM | POA: Diagnosis not present

## 2022-07-04 DIAGNOSIS — F1123 Opioid dependence with withdrawal: Secondary | ICD-10-CM | POA: Diagnosis not present

## 2022-07-11 DIAGNOSIS — Z79899 Other long term (current) drug therapy: Secondary | ICD-10-CM | POA: Diagnosis not present

## 2022-07-11 DIAGNOSIS — F1191 Opioid use, unspecified, in remission: Secondary | ICD-10-CM | POA: Diagnosis not present

## 2022-07-11 DIAGNOSIS — F411 Generalized anxiety disorder: Secondary | ICD-10-CM | POA: Diagnosis not present

## 2022-08-01 DIAGNOSIS — F1123 Opioid dependence with withdrawal: Secondary | ICD-10-CM | POA: Diagnosis not present

## 2022-08-22 DIAGNOSIS — Z79899 Other long term (current) drug therapy: Secondary | ICD-10-CM | POA: Diagnosis not present

## 2022-08-22 DIAGNOSIS — F1191 Opioid use, unspecified, in remission: Secondary | ICD-10-CM | POA: Diagnosis not present

## 2022-09-08 DIAGNOSIS — F1123 Opioid dependence with withdrawal: Secondary | ICD-10-CM | POA: Diagnosis not present

## 2022-09-27 DIAGNOSIS — F1191 Opioid use, unspecified, in remission: Secondary | ICD-10-CM | POA: Diagnosis not present

## 2022-09-27 DIAGNOSIS — R41840 Attention and concentration deficit: Secondary | ICD-10-CM | POA: Diagnosis not present

## 2022-09-27 DIAGNOSIS — Z79899 Other long term (current) drug therapy: Secondary | ICD-10-CM | POA: Diagnosis not present

## 2022-10-06 DIAGNOSIS — Z79899 Other long term (current) drug therapy: Secondary | ICD-10-CM | POA: Diagnosis not present

## 2022-10-06 DIAGNOSIS — F1191 Opioid use, unspecified, in remission: Secondary | ICD-10-CM | POA: Diagnosis not present

## 2022-11-15 DIAGNOSIS — L237 Allergic contact dermatitis due to plants, except food: Secondary | ICD-10-CM | POA: Diagnosis not present

## 2022-11-15 DIAGNOSIS — Z6824 Body mass index (BMI) 24.0-24.9, adult: Secondary | ICD-10-CM | POA: Diagnosis not present

## 2023-06-29 ENCOUNTER — Ambulatory Visit
Admission: RE | Admit: 2023-06-29 | Discharge: 2023-06-29 | Disposition: A | Discharge status: Home / Self Care | Payer: Self-pay | Source: Ambulatory Visit

## 2023-06-29 VITALS — BP 146/97 | HR 82 | Temp 98.2°F | Resp 20

## 2023-06-29 DIAGNOSIS — H00011 Hordeolum externum right upper eyelid: Secondary | ICD-10-CM

## 2023-06-29 MED ORDER — ERYTHROMYCIN 5 MG/GM OP OINT: TOPICAL_OINTMENT | OPHTHALMIC | 0 refills | Status: DC

## 2023-06-29 NOTE — ED Provider Notes (Signed)
 [facility]    CSN: [id number] Arrival date & time: 06/29/23  1659      History   Chief Complaint Chief Complaint  Patient presents with   Eye Problem    Entered by patient    HPI Nicholas Harrison is a 33 y.o. male.   Presenting today with 1 month history of a painful red bump to the right upper eye lid.  Denies drainage or bleeding to the area, known injury to the area, fever, chills, eye pain, visual change, headaches.  So far trying compresses with minimal relief.    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 Authorizing Provider  erythromycin ophthalmic ointment Place a 1/2 inch ribbon of ointment into the right lower eyelid BID prn. 06/29/23  Yes Particia Nearing, PA-C  FLUoxetine (PROZAC) 40 MG capsule Take 40 mg by mouth daily.    [provider]  traZODone  (DESYREL) 50 MG tablet Take 50 mg by mouth at bedtime.    [provider]    Family History Family History  Problem Relation Age of Onset   Depression Mother        anxiety   Diabetes Maternal Grandfather     Social History Social History   Tobacco Use   Smoking status: Former   Smokeless tobacco: Former    Types: Associate Professor status: Every Day  Substance Use Topics   Alcohol use: Not Currently   Drug use: Yes    Types: Benzodiazepines, Oxycodone, Marijuana    Comment: fentanyl, xanax, adderall     Allergies   Quetiapine fumarate   Review of Systems Review of Systems HPI  Physical Exam Triage Vital Signs ED Triage Vitals [06/29/23 1712]  Encounter Vitals Group     BP (!) 146/97     Systolic BP Percentile      Diastolic BP Percentile      Pulse Rate 82     Resp 20     Temp 98.2 F (36.8 C)     Temp Source Oral     SpO2 98 %     Weight      Height      Head Circumference      Peak Flow      Pain Score 0     Pain Loc      Pain Education  Exclude from Growth Chart    No data found.  Updated Vital Signs BP (!) 146/97 (BP Location: Right Arm)   Pulse 82   Temp 98.2 F (36.8 C) (Oral)   Resp 20   SpO2 98%   Visual Acuity Right Eye Distance: 20/30 Left Eye Distance: 20/20 Bilateral Distance: 20/20  Right Eye Near:   Left Eye Near:    Bilateral Near:     Physical Exam Vitals and nursing note reviewed.  Constitutional:      Appearance: Normal appearance.  HENT:     Head: Atraumatic.  Eyes:     Extraocular Movements: Extraocular movements intact.     Conjunctiva/sclera: Conjunctivae normal.     Pupils: Pupils are equal, round, and reactive to light.     Comments: Crusted erythematous papular lesion to the center of the right upper eyelid  Cardiovascular:     Rate and Rhythm: Normal rate.  Pulmonary:     Effort: Pulmonary effort is normal.  Musculoskeletal:        General: Normal range of motion.     Cervical  back: Normal range of motion and neck supple.  Skin:    General: Skin is warm and dry.  Neurological:     Mental Status: He is oriented to person, place, and time.  Psychiatric:        Mood and Affect: Mood normal.        Thought Content: Thought content normal.        Judgment: Judgment normal.      UC Treatments / Results  Labs (all labs ordered are listed, but only abnormal results are displayed) Labs Reviewed - No data to display  EKG   Radiology No results found.  Procedures Procedures (including critical care time)  Medications Ordered in UC Medications - No data to display  Initial Impression / Assessment and Plan / UC Course  I have reviewed the triage vital signs and the nursing notes.  Pertinent labs & imaging results that were available during my care of the patient were reviewed by me and considered in my medical decision making (see chart for details).     Treat with erythromycin ointment, warm compresses and PCP follow-up if not resolving.  Final Clinical Impressions(s) / UC Diagnoses   Final diagnoses:  Hordeolum externum of right upper eyelid   Discharge Instructions   None    ED Prescriptions     Medication Sig Dispense Auth. Provider   erythromycin ophthalmic ointment Place a 1/2 inch ribbon of ointment into the right lower eyelid BID prn. 3.5 g Particia Nearing, PA-C      PDMP not reviewed this encounter.   Particia Nearing, [place] 06/29/23 [phone]

## 2023-06-29 NOTE — ED Notes (Signed)
 While doing visual aquity pt stated right eye was blurry which is not normal for him.

## 2023-06-29 NOTE — ED Triage Notes (Signed)
 Pt reports he has a bump on his right eye x 1 month. Has formed a head x 2 days ago

## 2023-07-24 DIAGNOSIS — H00011 Hordeolum externum right upper eyelid: Secondary | ICD-10-CM | POA: Diagnosis not present

## 2023-07-24 DIAGNOSIS — H01001 Unspecified blepharitis right upper eyelid: Secondary | ICD-10-CM | POA: Diagnosis not present

## 2023-07-24 DIAGNOSIS — H01002 Unspecified blepharitis right lower eyelid: Secondary | ICD-10-CM | POA: Diagnosis not present

## 2023-07-24 DIAGNOSIS — H01004 Unspecified blepharitis left upper eyelid: Secondary | ICD-10-CM | POA: Diagnosis not present

## 2023-09-04 DIAGNOSIS — H0011 Chalazion right upper eyelid: Secondary | ICD-10-CM | POA: Diagnosis not present

## 2023-11-27 ENCOUNTER — Telehealth: Payer: Self-pay

## 2023-11-27 NOTE — Telephone Encounter (Signed)
 Copied from CRM [id number]. Topic: Appointments - Appointment Scheduling >> Nov 27, 2023  3:17 PM Franky GRADE wrote: Patient/patient representative is calling to schedule an appointment. Refer to attachments for appointment information.  Patient's mother is calling to see if Dr.Plotnikov would be able to see patient. He was a previous patient but has not been to see Dr.Plotnikov in quite a while.

## 2023-12-03 NOTE — Telephone Encounter (Signed)
Okay to schedule.  Thanks 

## 2023-12-19 ENCOUNTER — Encounter: Payer: Self-pay | Admitting: Internal Medicine

## 2023-12-19 ENCOUNTER — Ambulatory Visit: Admitting: Internal Medicine

## 2023-12-19 VITALS — BP 150/95 | HR 95 | Temp 98.1°F | Ht 69.0 in | Wt 166.4 lb

## 2023-12-19 DIAGNOSIS — F988 Other specified behavioral and emotional disorders with onset usually occurring in childhood and adolescence: Secondary | ICD-10-CM | POA: Diagnosis not present

## 2023-12-19 DIAGNOSIS — F1920 Other psychoactive substance dependence, uncomplicated: Secondary | ICD-10-CM | POA: Diagnosis not present

## 2023-12-19 DIAGNOSIS — F332 Major depressive disorder, recurrent severe without psychotic features: Secondary | ICD-10-CM | POA: Diagnosis not present

## 2023-12-19 NOTE — Progress Notes (Signed)
 Subjective:  Patient ID: Nicholas Harrison, male    DOB: 10-Nov-1990  Age: 33 y.o. MRN: [id number]  CC: New Patient (Initial Visit) (Patient states he has been having a lot of trouble has been off opioids for a while and after work he goes home and wants to go home and sleep and is not productive. Wondering if he should be on medication for ADHD. )   HPI [name] presents for a new patient visit (not seen for over 3 years) and for being off opioids for 1 year and 9 months C/o being depressed, fatigued, poor focus and boredom unless self-medicating with THC and cocaine.  Lucious his currently using weed vapes, admits to cocaine use daily on average $200 worth of cocaine per week, beer on weekends. He has been functioning normally at work and socially in spite of the drug use.  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  He is complaining of extreme social anxiety since he was a teenager with inability to introduce himself, had a conversation etc. prior to his drug use.  Opioids put him in a very dark place and he is glad he is not using opioids any longer (1 year and 9 months).  He is single.  He lives with his grandfather.  They get along very well.  His grandfather does not know about his current drug use.  He is complaining of possible attention deficit disorder that he thinks he had since his childhood. Running a  business  H/o anxiety, panic attacks   Outpatient Medications Prior to Visit  Medication Sig Dispense Refill   FLUoxetine  (PROZAC ) 40 MG capsule Take 40 mg by mouth daily.     erythromycin  ophthalmic ointment Place a 1/2 inch ribbon of ointment into the right lower eyelid BID prn. (Patient not taking: Reported on 12/19/2023) 3.5 g 0   traZODone  (DESYREL ) 50 MG tablet Take 50 mg by mouth at bedtime. (Patient not taking: Reported on 12/19/2023)     No facility-administered medications prior to visit.    ROS: Review of Systems  Constitutional:  Negative for appetite change,  fatigue and unexpected weight change.  HENT:  Negative for congestion, nosebleeds, sneezing, sore throat and trouble swallowing.   Eyes:  Negative for itching and visual disturbance.  Respiratory:  Negative for cough.   Cardiovascular:  Negative for chest pain, palpitations and leg swelling.  Gastrointestinal:  Negative for abdominal distention, blood in stool, diarrhea and nausea.  Genitourinary:  Negative for frequency and hematuria.  Musculoskeletal:  Negative for back pain, gait problem, joint swelling and neck pain.  Skin:  Negative for rash.  Neurological:  Negative for dizziness, tremors, speech difficulty and weakness.  Psychiatric/Behavioral:  Positive for decreased concentration and dysphoric mood. Negative for agitation, behavioral problems, confusion, hallucinations, self-injury, sleep disturbance and suicidal ideas. The patient is nervous/anxious. The patient is not hyperactive.     Objective:  BP (!) 150/95   Pulse 95   Temp 98.1 F (36.7 C) (Oral)   Ht 5' 9 (1.753 m)   Wt 166 lb 6.4 oz (75.5 kg)   SpO2 100%   BMI 24.57 kg/m   BP Readings from Last 3 Encounters:  12/19/23 (!) 150/95  06/29/23 (!) 146/97  03/22/22 (!) 146/98    Wt Readings from Last 3 Encounters:  12/19/23 166 lb 6.4 oz (75.5 kg)  03/18/22 170 lb (77.1 kg)  12/23/20 177 lb 14.6 oz (80.7 kg)    Physical Exam Constitutional:  General: He is not in acute distress.    Appearance: He is well-developed. He is not diaphoretic.     Comments: NAD  HENT:     Head: Normocephalic and atraumatic.     Right Ear: External ear normal.     Left Ear: External ear normal.     Nose: Nose normal.     Mouth/Throat:     Pharynx: No oropharyngeal exudate.  Eyes:     General: No scleral icterus.       Right eye: No discharge.        Left eye: No discharge.     Conjunctiva/sclera: Conjunctivae normal.     Pupils: Pupils are equal, round, and reactive to light.  Neck:     Thyroid : No thyromegaly.      Vascular: No JVD.     Trachea: No tracheal deviation.  Cardiovascular:     Rate and Rhythm: Normal rate and regular rhythm.     Heart sounds: Normal heart sounds. No murmur heard.    No friction rub. No gallop.  Pulmonary:     Effort: Pulmonary effort is normal. No respiratory distress.     Breath sounds: Normal breath sounds. No stridor. No wheezing or rales.  Chest:     Chest wall: No tenderness.  Abdominal:     General: Bowel sounds are normal. There is no distension.     Palpations: Abdomen is soft. There is no mass.     Tenderness: There is no abdominal tenderness. There is no guarding or rebound.  Genitourinary:    Penis: Normal. No tenderness.      Prostate: Normal.     Rectum: Normal. Guaiac result negative.  Musculoskeletal:        General: No tenderness. Normal range of motion.     Cervical back: Normal range of motion and neck supple.     Right lower leg: No edema.     Left lower leg: No edema.  Lymphadenopathy:     Cervical: No cervical adenopathy.  Skin:    General: Skin is warm and dry.     Coloration: Skin is not pale.     Findings: No erythema or rash.  Neurological:     Mental Status: He is alert and oriented to person, place, and time.     Cranial Nerves: No cranial nerve deficit.     Motor: No abnormal muscle tone.     Coordination: Coordination normal.     Gait: Gait normal.     Deep Tendon Reflexes: Reflexes are normal and symmetric. Reflexes normal.  Psychiatric:        Behavior: Behavior normal.        Thought Content: Thought content normal.        Judgment: Judgment normal.   The patient is well-dressed, groomed, pleasant and cooperative    A total time of 65 minutes was spent preparing to see the patient, reviewing tests, x-rays, and other medical records.  Also, obtaining history and performing comprehensive physical exam.  Additionally, counseling the patient regarding the above listed issues -polysubstance abuse, potential ADD, anxiety  disorder.   Finally, documenting clinical information in the health records, coordination of care, educating the patient. It is a complex case.   Lab Results  Component Value Date   WBC 7.4 03/20/2022   HGB 14.0 03/20/2022   HCT 42.2 03/20/2022   PLT 232 03/20/2022   GLUCOSE 106 (H) 03/20/2022   CHOL 152 11/09/2017   TRIG 79 11/09/2017  HDL 36 (L) 11/09/2017   LDLDIRECT 87.1 08/30/2011   LDLCALC 100 (H) 11/09/2017   ALT 16 03/20/2022   AST 18 03/20/2022   NA 144 03/20/2022   K 3.7 03/20/2022   CL 114 (H) 03/20/2022   CREATININE 0.80 03/20/2022   BUN 29 (H) 03/20/2022   CO2 24 03/20/2022   TSH 4.474 03/19/2022   HGBA1C 4.7 (L) 11/09/2017    No results found.  Assessment & Plan:   Problem List Items Addressed This Visit     ADD (attention deficit disorder) - Primary   Lucious is c/o being depressed, fatigued, poor focus and boredom unless self-medicating  Lucious his currently using weed vapes, admits to cocaine use daily on average $200 worth of cocaine per week, beer on weekends. He has been functioning normally at work and socially in spite of the drug use.  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  He is complaining of extreme social anxiety since he was a teenager with inability to introduce himself, had a conversation etc. prior to his drug use.  Opioids put him in a very dark place and he is glad he is not using opioids any longer (1 year and 9 months).  He is single.  He lives with his grandfather.  They get along very well.  His grandfather does not know about his current drug use.  He is complaining of possible attention deficit disorder that he thinks he had since his childhood. 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  We discussed his drug problem.  He understand that his issues are beyond my area of expertise and I offered a referral to psychiatry/psychology/substance abuse medicine for his  polysubstance abuse problem.  He declined I told Lucious that we can potentially use controlled substances for attention deficit treatment if he stops using illegal substances and he is urine drug screen remains clean.  He understands  Will do formal ADD  testing      Relevant Orders   Ambulatory referral to Psychology   MDD (major depressive disorder), recurrent severe, without psychosis (HCC)   Lucious is c/o being depressed, fatigued, poor focus and boredom unless self-medicating  Lucious his currently using weed vapes, admits to cocaine use daily on average $200 worth of cocaine per week, beer on weekends. He has been functioning normally at work and socially in spite of the drug use.  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  He is complaining of extreme social anxiety since he was a teenager with inability to introduce himself, had a conversation etc. prior to his drug use.  Opioids put him in a very dark place and he is glad he is not using opioids any longer (1 year and 9 months).  He is single.  He lives with his grandfather.  They get along very well.  His grandfather does not know about his current drug use.  He is complaining of possible attention deficit disorder that he thinks he had since his childhood. 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  We discussed his drug problem.  He understand that his issues are beyond my area of expertise and I offered a referral to psychiatry/psychology/substance abuse medicine for his polysubstance abuse problem.  He declined I told Lucious that we can potentially use controlled substances for attention deficit treatment if he stops using illegal substances and he is  urine drug screen remains clean.  He understands      Polysubstance (excluding opioids) dependence w/o physiol dependence (HCC)   Lucious is c/o being depressed, fatigued, poor focus and boredom unless  self-medicating  Lucious his currently using weed vapes, admits to cocaine use daily on average $200 worth of cocaine per week, beer on weekends. He has been functioning normally at work and socially in spite of the drug use.  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  He is complaining of extreme social anxiety since he was a teenager with inability to introduce himself, had a conversation etc. prior to his drug use.  Opioids put him in a very dark place and he is glad he is not using opioids any longer (1 year and 9 months).  He is single.  He lives with his grandfather.  They get along very well.  His grandfather does not know about his current drug use.  He is complaining of possible attention deficit disorder that he thinks he had since his childhood. Running a  business   He was referred to psychology for ADD testing      Relevant Orders   Ambulatory referral to Psychology      No orders of the defined types were placed in this encounter.     Follow-up: Return in about 4 weeks (around 01/16/2024) for a follow-up visit.  Marolyn Noel, MD

## 2023-12-19 NOTE — Assessment & Plan Note (Addendum)
 Nicholas Harrison is c/o being depressed, fatigued, poor focus and boredom unless self-medicating  Nicholas Harrison his currently using weed vapes, admits to cocaine use daily on average $200 worth of cocaine per week, beer on weekends. He has been functioning normally at work and socially in spite of the drug use.  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  He is complaining of extreme social anxiety since he was a teenager with inability to introduce himself, had a conversation etc. prior to his drug use.  Opioids put him in a very dark place and he is glad he is not using opioids any longer (1 year and 9 months).  He is single.  He lives with his grandfather.  They get along very well.  His grandfather does not know about his current drug use.  He is complaining of possible attention deficit disorder that he thinks he had since his childhood. 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  We discussed his drug problem.  He understand that his issues are beyond my area of expertise and I offered a referral to psychiatry/psychology/substance abuse medicine for his polysubstance abuse problem.  He declined I told Nicholas Harrison that we can potentially use controlled substances for attention deficit treatment if he stops using illegal substances and he is urine drug screen remains clean.  He understands  Will do formal ADD  testing

## 2023-12-19 NOTE — Assessment & Plan Note (Addendum)
 Nicholas Harrison is c/o being depressed, fatigued, poor focus and boredom unless self-medicating  Nicholas Harrison his currently using weed vapes, admits to cocaine use daily on average $200 worth of cocaine per week, beer on weekends. He has been functioning normally at work and socially in spite of the drug use.  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  He is complaining of extreme social anxiety since he was a teenager with inability to introduce himself, had a conversation etc. prior to his drug use.  Opioids put him in a very dark place and he is glad he is not using opioids any longer (1 year and 9 months).  He is single.  He lives with his grandfather.  They get along very well.  His grandfather does not know about his current drug use.  He is complaining of possible attention deficit disorder that he thinks he had since his childhood. 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  We discussed his drug problem.  He understand that his issues are beyond my area of expertise and I offered a referral to psychiatry/psychology/substance abuse medicine for his polysubstance abuse problem.  He declined I told Nicholas Harrison that we can potentially use controlled substances for attention deficit treatment if he stops using illegal substances and he is urine drug screen remains clean.  He understands

## 2023-12-24 NOTE — Assessment & Plan Note (Signed)
 Nicholas Harrison is c/o being depressed, fatigued, poor focus and boredom unless self-medicating  Nicholas Harrison his currently using weed vapes, admits to cocaine use daily on average $200 worth of cocaine per week, beer on weekends. He has been functioning normally at work and socially in spite of the drug use.  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  He is complaining of extreme social anxiety since he was a teenager with inability to introduce himself, had a conversation etc. prior to his drug use.  Opioids put him in a very dark place and he is glad he is not using opioids any longer (1 year and 9 months).  He is single.  He lives with his grandfather.  They get along very well.  His grandfather does not know about his current drug use.  He is complaining of possible attention deficit disorder that he thinks he had since his childhood. 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  We discussed his drug problem.  He understand that his issues are beyond my area of expertise and I offered a referral to psychiatry/psychology/substance abuse medicine for his polysubstance abuse problem.  He declined I told Nicholas Harrison that we can potentially use controlled substances for attention deficit treatment if he stops using illegal substances and he is urine drug screen remains clean.  He understands
